# Patient Record
Sex: Female | Born: 1995 | Race: Black or African American | Hispanic: No | Marital: Single | Smoking: Current every day smoker
Health system: Southern US, Community
[De-identification: ages and names within clinical notes are randomized; demographics above are authoritative.]

## PROBLEM LIST (undated history)

## (undated) DIAGNOSIS — D649 Anemia, unspecified: Secondary | ICD-10-CM

## (undated) DIAGNOSIS — I209 Angina pectoris, unspecified: Secondary | ICD-10-CM

## (undated) DIAGNOSIS — I609 Nontraumatic subarachnoid hemorrhage, unspecified: Secondary | ICD-10-CM

## (undated) DIAGNOSIS — R569 Unspecified convulsions: Secondary | ICD-10-CM

## (undated) DIAGNOSIS — R011 Cardiac murmur, unspecified: Secondary | ICD-10-CM

## (undated) DIAGNOSIS — K649 Unspecified hemorrhoids: Secondary | ICD-10-CM

## (undated) DIAGNOSIS — R519 Headache, unspecified: Secondary | ICD-10-CM

## (undated) HISTORY — PX: NO PAST SURGERIES: SHX2092

---
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
## 2017-06-05 ENCOUNTER — Encounter: Payer: Self-pay | Admitting: Emergency Medicine

## 2017-06-05 ENCOUNTER — Emergency Department: Payer: Managed Care, Other (non HMO)

## 2017-06-05 ENCOUNTER — Emergency Department
Admission: EM | Admit: 2017-06-05 | Discharge: 2017-06-05 | Disposition: A | Discharge status: Home / Self Care | Payer: Managed Care, Other (non HMO) | Attending: Student in an Organized Health Care Education/Training Program | Admitting: Student in an Organized Health Care Education/Training Program

## 2017-06-05 DIAGNOSIS — Y998 Other external cause status: Secondary | ICD-10-CM | POA: Insufficient documentation

## 2017-06-05 DIAGNOSIS — Y92810 Car as the place of occurrence of the external cause: Secondary | ICD-10-CM | POA: Insufficient documentation

## 2017-06-05 DIAGNOSIS — S62667A Nondisplaced fracture of distal phalanx of left little finger, initial encounter for closed fracture: Secondary | ICD-10-CM

## 2017-06-05 DIAGNOSIS — Y939 Activity, unspecified: Secondary | ICD-10-CM | POA: Diagnosis not present

## 2017-06-05 DIAGNOSIS — W231XXD Caught, crushed, jammed, or pinched between stationary objects, subsequent encounter: Secondary | ICD-10-CM | POA: Diagnosis not present

## 2017-06-05 DIAGNOSIS — S60947A Unspecified superficial injury of left little finger, initial encounter: Secondary | ICD-10-CM | POA: Diagnosis present

## 2017-06-05 MED ORDER — NAPROXEN 500 MG PO TABS: 500.0000 mg | ORAL_TABLET | Freq: Two times a day (BID) | ORAL | Status: DC

## 2017-06-05 NOTE — Discharge Instructions (Signed)
Wear splint until evaluation by orthopedic clinic. Call to schedule an appointment.
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
## 2017-07-20 ENCOUNTER — Emergency Department (HOSPITAL_COMMUNITY)
Admission: EM | Admit: 2017-07-20 | Discharge: 2017-07-20 | Disposition: A | Discharge status: Home / Self Care | Payer: Managed Care, Other (non HMO) | Attending: Emergency Medicine | Admitting: Emergency Medicine

## 2017-07-20 ENCOUNTER — Emergency Department (HOSPITAL_COMMUNITY): Payer: Managed Care, Other (non HMO)

## 2017-07-20 ENCOUNTER — Encounter (HOSPITAL_COMMUNITY): Payer: Self-pay

## 2017-07-20 DIAGNOSIS — R05 Cough: Secondary | ICD-10-CM | POA: Insufficient documentation

## 2017-07-20 DIAGNOSIS — R112 Nausea with vomiting, unspecified: Secondary | ICD-10-CM | POA: Insufficient documentation

## 2017-07-20 DIAGNOSIS — E669 Obesity, unspecified: Secondary | ICD-10-CM | POA: Insufficient documentation

## 2017-07-20 DIAGNOSIS — J029 Acute pharyngitis, unspecified: Secondary | ICD-10-CM | POA: Insufficient documentation

## 2017-07-20 DIAGNOSIS — R0981 Nasal congestion: Secondary | ICD-10-CM | POA: Diagnosis not present

## 2017-07-20 DIAGNOSIS — R059 Cough, unspecified: Secondary | ICD-10-CM

## 2017-07-20 DIAGNOSIS — H9202 Otalgia, left ear: Secondary | ICD-10-CM | POA: Insufficient documentation

## 2017-07-20 DIAGNOSIS — R1110 Vomiting, unspecified: Secondary | ICD-10-CM | POA: Diagnosis present

## 2017-07-20 MED ORDER — ONDANSETRON 4 MG PO TBDP: 4.0000 mg | ORAL_TABLET | Freq: Three times a day (TID) | ORAL | 0 refills | Status: DC | PRN

## 2017-07-20 MED ORDER — BENZONATATE 100 MG PO CAPS: 100.0000 mg | ORAL_CAPSULE | Freq: Three times a day (TID) | ORAL | 0 refills | Status: DC

## 2017-07-20 NOTE — ED Triage Notes (Signed)
Pt reports post tussive emesis since yesterday. PT reports she has had cough x 4 days and has developed a sore throat since then

## 2017-07-20 NOTE — ED Provider Notes (Signed)
[facility]EMERGENCY DEPT Provider Note   CSN: [id number] Arrival date & time: 07/20/17  1652     History   Chief Complaint Chief Complaint  Patient presents with  . Emesis  . Cough    HPI Heather [name] is a 21 y.o. female who presents with cough and vomiting. She states that she had a cold about 1-2 weeks ago. Her symptoms improved from this but over the past three days she has had a worsening cough, nasal congestion, L ear pain, sore throat and post-tussive emesis. She also has nausea and vomiting after eating as well. She denies fever, chills, chest pain, SOB, abdominal pain, diarrhea, or dysuria. She denies known sick contacts.  HPI  Past Medical History:  Diagnosis Date  . Seizures (HCC)    last at age 21; stopped taking AED's in Spring 2014.    Patient Active Problem List   Diagnosis Date Noted  . Acne 12/06/2015  . Family history of breast cancer in female 11/30/2015  . Chlamydia infection 11/30/2015    History reviewed. No pertinent surgical history.  OB History    Gravida Para Term Preterm AB Living   0 0 0 0 0 0   SAB TAB Ectopic Multiple Live Births   0 0 0 0         Home Medications    Prior to Admission medications   Medication Sig Start Date End Date Taking? Authorizing Provider  azithromycin (ZITHROMAX) 250 MG tablet Four pills by mouth times one 12/03/15   Lada, Janit BernMelinda P, MD  benzonatate (TESSALON) 100 MG capsule Take 1 capsule (100 mg total) by mouth every 8 (eight) hours. 07/20/17   Bethel BornGekas, Tanea Moga Marie, PA-C  clindamycin (CLEOCIN-T) 1 % lotion Apply topically 2 (two) times daily. 11/30/15   Kerman PasseyLada, Melinda P, MD  ibuprofen (ADVIL,MOTRIN) 200 MG tablet Take 400 mg by mouth every 6 (six) hours as needed for mild pain.    [provider]  naproxen (NAPROSYN) 500 MG tablet Take 1 tablet (500 mg total) by mouth 2 (two) times daily with a meal. 06/05/17   Joni ReiningSmith, Ronald K, PA-C  ondansetron (ZOFRAN ODT) 4 MG disintegrating tablet Take 1 tablet (4 mg  total) by mouth every 8 (eight) hours as needed for nausea or vomiting. 07/20/17   Bethel BornGekas, Treyden Hakim Marie, PA-C    Family History Family History  Problem Relation Age of Onset  . Hypertension Father   . Cancer Maternal Uncle        breast  . Cancer Maternal Grandmother        breast  . Hypertension Paternal Grandmother   . COPD Neg Hx   . Diabetes Neg Hx   . Heart disease Neg Hx   . Stroke Neg Hx     Social History Social History  Substance Use Topics  . Smoking status: Never Smoker  . Smokeless tobacco: Never Used  . Alcohol use No     Allergies   Patient has no known allergies.   Review of Systems Review of Systems  Constitutional: Negative for chills and fever.  HENT: Positive for congestion, ear pain and sore throat. Negative for sinus pain.   Respiratory: Positive for cough. Negative for shortness of breath and wheezing.   Cardiovascular: Negative for chest pain.  Gastrointestinal: Positive for nausea and vomiting. Negative for abdominal pain and diarrhea.     Physical Exam Updated Vital Signs BP 132/74 (BP Location: Left Arm)   Pulse 68   Temp 98.8 F (37.1  C) (Oral)   Resp 16   Ht  (1.676 m)   Wt 89.8 kg (198 lb)   SpO2 99%   BMI 31.96 kg/m   Physical Exam  Constitutional: She is oriented to person, place, and time. She appears well-developed and well-nourished. No distress.  HENT:  Head: Normocephalic and atraumatic.  Right Ear: Hearing, tympanic membrane, external ear and ear canal normal.  Left Ear: Hearing, external ear and ear canal normal.  Nose: Mucosal edema present.  Mouth/Throat: Uvula is midline, oropharynx is clear and moist and mucous membranes are normal.  L cerumen impaction  Eyes: Pupils are equal, round, and reactive to light. Conjunctivae are normal. Right eye exhibits no discharge. Left eye exhibits no discharge. No scleral icterus.  Neck: Normal range of motion.  Cardiovascular: Normal rate and regular rhythm.  Exam reveals  no gallop and no friction rub.   No murmur heard. Pulmonary/Chest: Effort normal and breath sounds normal. No respiratory distress. She has no wheezes. She has no rales. She exhibits no tenderness.  Abdominal: Soft. Bowel sounds are normal. She exhibits no distension. There is no tenderness.  Neurological: She is alert and oriented to person, place, and time.  Skin: Skin is warm and dry.  Psychiatric: She has a normal mood and affect. Her behavior is normal.  Nursing note and vitals reviewed.    ED Treatments / Results  Labs (all labs ordered are listed, but only abnormal results are displayed) Labs Reviewed - No data to display  EKG  EKG Interpretation None       Radiology Dg Chest 2 View  Result Date: 07/20/2017 CLINICAL DATA:  Pt c/o cough and N/V x 1 day. No hx of heart or lung problems. Pt is a nonsmoker EXAM: CHEST  2 VIEW COMPARISON:  11/08/2005 FINDINGS: Normal heart, mediastinum and hila. The lungs are clear and are symmetrically aerated. No pleural effusion or pneumothorax. Skeletal structures are unremarkable. IMPRESSION: Normal chest radiographs. Electronically Signed   By: Amie Portland M.D.   On: 07/20/2017 17:23    Procedures Procedures (including critical care time)  Medications Ordered in ED Medications - No data to display   Initial Impression / Assessment and Plan / ED Course  I have reviewed the triage vital signs and the nursing notes.  Pertinent labs & imaging results that were available during my care of the patient were reviewed by me and considered in my medical decision making (see chart for details).  21 year old female with cough and vomiting. Vitals are normal. She is well-appearing. Exam is overall unremarkable, likely viral illness. CXR is negative. Will treat with Zofran and Tessalon. Return precautions given.  Final Clinical Impressions(s) / ED Diagnoses   Final diagnoses:  Cough  Non-intractable vomiting with nausea, unspecified  vomiting type    New Prescriptions New Prescriptions   BENZONATATE (TESSALON) 100 MG CAPSULE    Take 1 capsule (100 mg total) by mouth every 8 (eight) hours.   ONDANSETRON (ZOFRAN ODT) 4 MG DISINTEGRATING TABLET    Take 1 tablet (4 mg total) by mouth every 8 (eight) hours as needed for nausea or vomiting.     Bethel Born, PA-C 07/20/17 Thedora Hinders, MD 07/21/17 [phone]

## 2017-07-20 NOTE — Discharge Instructions (Signed)
Take Tessalon for cough as needed Take Zofran for nausea and vomiting Please follow up with your doctor

## 2017-07-20 NOTE — ED Notes (Signed)
Declined W/C at D/C and was escorted to lobby by RN. 

## 2017-07-30 ENCOUNTER — Emergency Department (HOSPITAL_COMMUNITY)
Admission: EM | Admit: 2017-07-30 | Discharge: 2017-07-30 | Disposition: A | Discharge status: Home / Self Care | Payer: Managed Care, Other (non HMO) | Attending: Emergency Medicine | Admitting: Emergency Medicine

## 2017-07-30 ENCOUNTER — Encounter (HOSPITAL_COMMUNITY): Payer: Self-pay | Admitting: Emergency Medicine

## 2017-07-30 DIAGNOSIS — H60501 Unspecified acute noninfective otitis externa, right ear: Secondary | ICD-10-CM | POA: Diagnosis not present

## 2017-07-30 DIAGNOSIS — Z79899 Other long term (current) drug therapy: Secondary | ICD-10-CM | POA: Diagnosis not present

## 2017-07-30 DIAGNOSIS — H9201 Otalgia, right ear: Secondary | ICD-10-CM | POA: Diagnosis present

## 2017-07-30 MED ORDER — CIPROFLOXACIN-DEXAMETHASONE 0.3-0.1 % OT SUSP: 4.0000 [drp] | Freq: Two times a day (BID) | OTIC | 0 refills | Status: DC

## 2017-07-30 NOTE — ED Notes (Addendum)
Attempted to asses pt ear, she states "I don't want you to look in my ear because it hurts". She states it hurts on the inside and outside. Explained to pt that the EDP will need to look in her ear to determine if there is any infection. When asked if she has had any fever pt replies "I don't know"

## 2017-07-30 NOTE — ED Triage Notes (Signed)
C/o R ear pain x 2 days. 

## 2017-07-30 NOTE — ED Provider Notes (Signed)
[facility] Provider Note   CSN: [id number] Arrival date & time: 07/30/17  1557     History   Chief Complaint Chief Complaint  Patient presents with  . Otalgia    HPI Heather [name] is a 21 y.o. female.  The history is provided by the patient and medical records.  Otalgia   21 year old female with history of seizures, presenting to the ED with right ear pain. States it has been hurting her for about 2-3 days now. She denies any fever or chills. States her hearing sounds muffled and her ear seems "swollen". She's not tried any medications for her symptoms.  Past Medical History:  Diagnosis Date  . Seizures (HCC)    last at age 14; stopped taking AED's in Spring 2014.    Patient Active Problem List   Diagnosis Date Noted  . Acne 12/06/2015  . Family history of breast cancer in female 11/30/2015  . Chlamydia infection 11/30/2015    History reviewed. No pertinent surgical history.  OB History    Gravida Para Term Preterm AB Living   0 0 0 0 0 0   SAB TAB Ectopic Multiple Live Births   0 0 0 0         Home Medications    Prior to Admission medications   Medication Sig Start Date End Date Taking? Authorizing Provider  azithromycin (ZITHROMAX) 250 MG tablet Four pills by mouth times one 12/03/15   Lada, Janit Bern, MD  benzonatate (TESSALON) 100 MG capsule Take 1 capsule (100 mg total) by mouth every 8 (eight) hours. 07/20/17   Bethel Born, PA-C  clindamycin (CLEOCIN-T) 1 % lotion Apply topically 2 (two) times daily. 11/30/15   Kerman Passey, MD  ibuprofen (ADVIL,MOTRIN) 200 MG tablet Take 400 mg by mouth every 6 (six) hours as needed for mild pain.    [provider]  naproxen (NAPROSYN) 500 MG tablet Take 1 tablet (500 mg total) by mouth 2 (two) times daily with a meal. 06/05/17   Joni Reining, PA-C  ondansetron (ZOFRAN ODT) 4 MG disintegrating tablet Take 1 tablet (4 mg total) by mouth every 8 (eight) hours as needed for nausea or  vomiting. 07/20/17   Bethel Born, PA-C    Family History Family History  Problem Relation Age of Onset  . Hypertension Father   . Cancer Maternal Uncle        breast  . Cancer Maternal Grandmother        breast  . Hypertension Paternal Grandmother   . COPD Neg Hx   . Diabetes Neg Hx   . Heart disease Neg Hx   . Stroke Neg Hx     Social History Social History  Substance Use Topics  . Smoking status: Never Smoker  . Smokeless tobacco: Never Used  . Alcohol use No     Allergies   Patient has no known allergies.   Review of Systems Review of Systems  HENT: Positive for ear pain.   All other systems reviewed and are negative.    Physical Exam Updated Vital Signs BP 138/88 (BP Location: Right Arm)   Pulse 84   Temp 98.4 F (36.9 C) (Oral)   Resp 16   SpO2 100%   Physical Exam  Constitutional: She is oriented to person, place, and time. She appears well-developed and well-nourished.  HENT:  Head: Normocephalic and atraumatic.  Mouth/Throat: Oropharynx is clear and moist.  Right EAC swollen and tender to palpation, TMs  are intact and without signs of infection bilaterally; no mastoid tenderness  Eyes: Pupils are equal, round, and reactive to light. Conjunctivae and EOM are normal.  Neck: Normal range of motion.  Cardiovascular: Normal rate, regular rhythm and normal heart sounds.   Pulmonary/Chest: Effort normal and breath sounds normal.  Abdominal: Soft. Bowel sounds are normal.  Musculoskeletal: Normal range of motion.  Neurological: She is alert and oriented to person, place, and time.  Skin: Skin is warm and dry.  Psychiatric: She has a normal mood and affect.  Nursing note and vitals reviewed.    ED Treatments / Results  Labs (all labs ordered are listed, but only abnormal results are displayed) Labs Reviewed - No data to display  EKG  EKG Interpretation None       Radiology No results found.  Procedures Procedures (including  critical care time)  Medications Ordered in ED Medications - No data to display   Initial Impression / Assessment and Plan / ED Course  I have reviewed the triage vital signs and the nursing notes.  Pertinent labs & imaging results that were available during my care of the patient were reviewed by me and considered in my medical decision making (see chart for details).  21 year old female here with right ear pain. Appears to have otitis externa. TMs are normal in appearance bilaterally. No mastoid tenderness. Will start on Ciprodex drops. Will have her follow-up with her primary care doctor.  Discussed plan with patient, she acknowledged understanding and agreed with plan of care.  Return precautions given for new or worsening symptoms.  Final Clinical Impressions(s) / ED Diagnoses   Final diagnoses:  Acute otitis externa of right ear, unspecified type    New Prescriptions New Prescriptions   CIPROFLOXACIN-DEXAMETHASONE (CIPRODEX) OTIC SUSPENSION    Place 4 drops into the right ear 2 (two) times daily.     Garlon Hatchet, PA-C 07/30/17 1719    Melene Plan, DO 07/30/17 1730

## 2017-07-30 NOTE — Discharge Instructions (Signed)
Take the prescribed medication as directed.  Can use tylenol/motrin for pain.  Can also use warm compresses. Follow-up with your primary care doctor. Return to the ED for new or worsening symptoms.

## 2017-09-21 IMAGING — CR DG CHEST 2V
2 series · 2 of 2 positions shown · non-contrast
Comparison: 11/08/2005

CLINICAL DATA: Pt c/o cough and N/V x 1 day. No hx of heart or lung
problems. Pt is a nonsmoker

EXAM:
CHEST  2 VIEW

[chest pa]
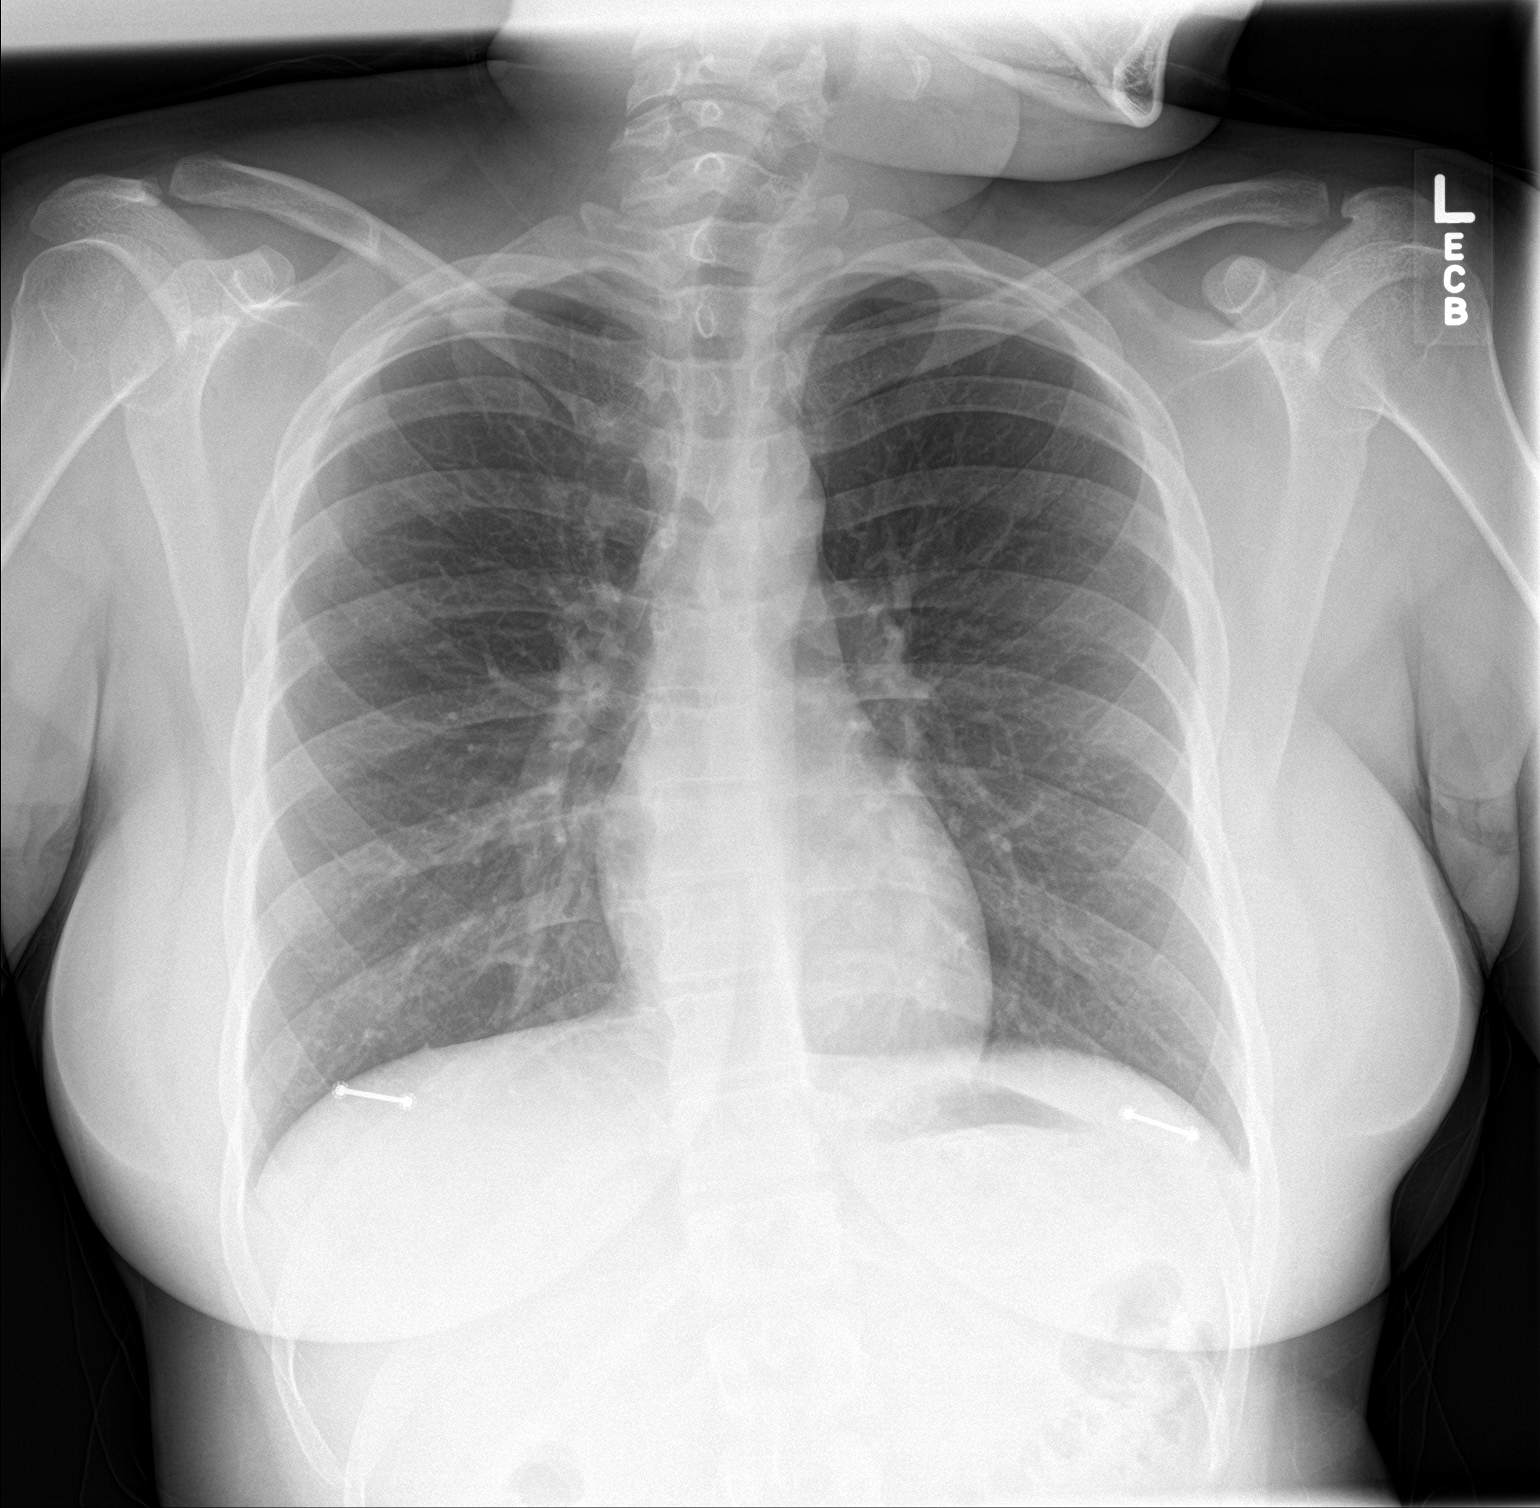

[chest lat]
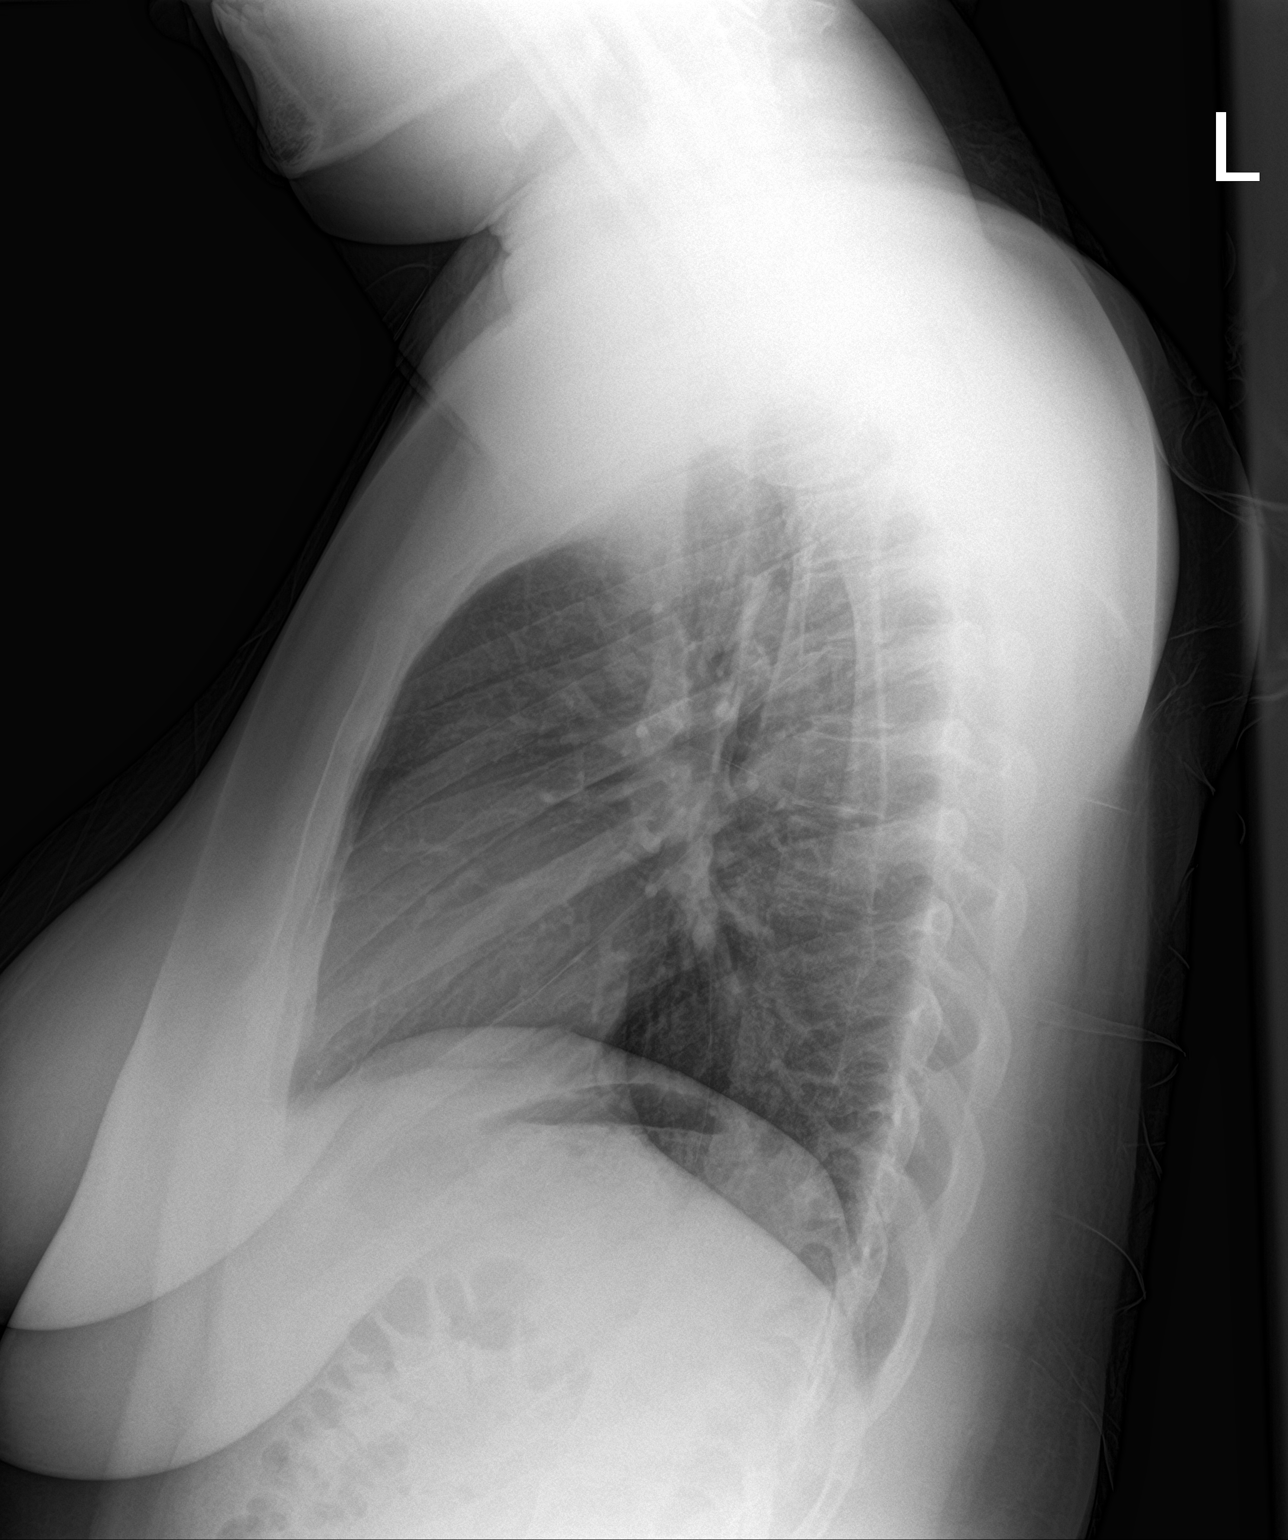

[2 of 2 positions shown; findings below may reference images not displayed]

FINDINGS: Normal heart, mediastinum and hila.

The lungs are clear and are symmetrically aerated.

No pleural effusion or pneumothorax.

Skeletal structures are unremarkable.
IMPRESSION: Normal chest radiographs.

## 2017-11-18 ENCOUNTER — Encounter (HOSPITAL_COMMUNITY): Payer: Self-pay

## 2017-11-18 ENCOUNTER — Emergency Department (HOSPITAL_COMMUNITY)
Admission: EM | Admit: 2017-11-18 | Discharge: 2017-11-18 | Disposition: A | Discharge status: Home / Self Care | Payer: Managed Care, Other (non HMO) | Attending: Emergency Medicine | Admitting: Emergency Medicine

## 2017-11-18 DIAGNOSIS — Z5321 Procedure and treatment not carried out due to patient leaving prior to being seen by health care provider: Secondary | ICD-10-CM | POA: Insufficient documentation

## 2017-11-18 DIAGNOSIS — R1030 Lower abdominal pain, unspecified: Secondary | ICD-10-CM | POA: Diagnosis not present

## 2017-11-18 LAB — COMPREHENSIVE METABOLIC PANEL
ALBUMIN: 4.5 g/dL (ref 3.5–5.0)
ALK PHOS: 86 U/L (ref 38–126)
ALT: 15 U/L (ref 14–54)
AST: 18 U/L (ref 15–41)
Anion gap: 5 (ref 5–15)
BILIRUBIN TOTAL: 1.1 mg/dL (ref 0.3–1.2)
BUN: 8 mg/dL (ref 6–20)
CALCIUM: 9.3 mg/dL (ref 8.9–10.3)
CO2: 26 mmol/L (ref 22–32)
CREATININE: 0.92 mg/dL (ref 0.44–1.00)
Chloride: 107 mmol/L (ref 101–111)
GFR calc Af Amer: >60 mL/min (ref >60)
GFR calc non Af Amer: >60 mL/min (ref >60)
GLUCOSE: 87 mg/dL (ref 65–99)
Potassium: 3.5 mmol/L (ref 3.5–5.1)
Sodium: 138 mmol/L (ref 135–145)
Total Protein: 7.4 g/dL (ref 6.5–8.1)

## 2017-11-18 LAB — CBC
HCT: 41.1 % (ref 36.0–46.0)
Hemoglobin: 13.6 g/dL (ref 12.0–15.0)
MCH: 30 pg (ref 26.0–34.0)
MCHC: 33.1 g/dL (ref 30.0–36.0)
MCV: 90.7 fL (ref 78.0–100.0)
Platelets: 225 10*3/uL (ref 150–400)
RBC: 4.53 MIL/uL (ref 3.87–5.11)
RDW: 12.9 % (ref 11.5–15.5)
WBC: 16.2 10*3/uL — ABNORMAL HIGH (ref 4.0–10.5)

## 2017-11-18 LAB — URINALYSIS, ROUTINE W REFLEX MICROSCOPIC
BILIRUBIN URINE: NEGATIVE
Glucose, UA: NEGATIVE mg/dL
Hgb urine dipstick: NEGATIVE
KETONES UR: 5 mg/dL — AB
Leukocytes, UA: NEGATIVE
Nitrite: NEGATIVE
PH: 5 (ref 5.0–8.0)
Protein, ur: NEGATIVE mg/dL
Specific Gravity, Urine: 1.027 (ref 1.005–1.030)

## 2017-11-18 LAB — I-STAT BETA HCG BLOOD, ED (MC, WL, AP ONLY): I-stat hCG, quantitative: <5 m[IU]/mL (ref <5)

## 2017-11-18 LAB — LIPASE, BLOOD: Lipase: 17 U/L (ref 11–51)

## 2017-11-18 NOTE — ED Triage Notes (Signed)
Pt complains of lower abd pain since 6am, denies vomiting and diarrhea

## 2017-11-18 NOTE — ED Notes (Signed)
Pt c/o lower abd pain onset this morning. She took some pepto-bismol with no effect. Normal bowel movement today. Ambulatory.

## 2017-11-18 NOTE — ED Notes (Signed)
Pt called from the lobby to roomed, no answer at this time.

## 2017-11-18 NOTE — ED Notes (Signed)
Pt not seen in the lobby  

## 2018-01-21 ENCOUNTER — Encounter (HOSPITAL_COMMUNITY): Payer: Self-pay | Admitting: Emergency Medicine

## 2018-01-21 ENCOUNTER — Emergency Department (HOSPITAL_COMMUNITY)
Admission: EM | Admit: 2018-01-21 | Discharge: 2018-01-21 | Disposition: A | Discharge status: Home / Self Care | Payer: Managed Care, Other (non HMO) | Attending: Emergency Medicine | Admitting: Emergency Medicine

## 2018-01-21 DIAGNOSIS — M25562 Pain in left knee: Secondary | ICD-10-CM | POA: Insufficient documentation

## 2018-01-21 DIAGNOSIS — Z5321 Procedure and treatment not carried out due to patient leaving prior to being seen by health care provider: Secondary | ICD-10-CM | POA: Insufficient documentation
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
## 2018-01-21 NOTE — ED Notes (Signed)
Left AMA at this time.  Encouraged to stay without success.

## 2018-02-28 DIAGNOSIS — R569 Unspecified convulsions: Secondary | ICD-10-CM | POA: Insufficient documentation

## 2018-07-09 ENCOUNTER — Emergency Department
Admission: EM | Admit: 2018-07-09 | Discharge: 2018-07-09 | Disposition: A | Discharge status: Home / Self Care | Payer: Medicaid Other | Attending: Emergency Medicine | Admitting: Emergency Medicine

## 2018-07-09 ENCOUNTER — Other Ambulatory Visit: Payer: Self-pay

## 2018-07-09 ENCOUNTER — Emergency Department
Admission: EM | Admit: 2018-07-09 | Discharge: 2018-07-09 | Disposition: A | Discharge status: Home / Self Care | Payer: Medicaid Other | Source: Home / Self Care | Attending: Emergency Medicine | Admitting: Emergency Medicine

## 2018-07-09 ENCOUNTER — Encounter: Payer: Self-pay | Admitting: Emergency Medicine

## 2018-07-09 DIAGNOSIS — R109 Unspecified abdominal pain: Secondary | ICD-10-CM

## 2018-07-09 DIAGNOSIS — N760 Acute vaginitis: Principal | ICD-10-CM

## 2018-07-09 DIAGNOSIS — B9689 Other specified bacterial agents as the cause of diseases classified elsewhere: Secondary | ICD-10-CM

## 2018-07-09 DIAGNOSIS — Z5320 Procedure and treatment not carried out because of patient's decision for unspecified reasons: Secondary | ICD-10-CM | POA: Diagnosis not present

## 2018-07-09 LAB — POCT PREGNANCY, URINE: Preg Test, Ur: NEGATIVE

## 2018-07-09 LAB — CBC
HCT: 39.7 % (ref 35.0–47.0)
HEMOGLOBIN: 13.5 g/dL (ref 12.0–16.0)
MCH: 31.3 pg (ref 26.0–34.0)
MCHC: 33.9 g/dL (ref 32.0–36.0)
MCV: 92.2 fL (ref 80.0–100.0)
PLATELETS: 217 10*3/uL (ref 150–440)
RBC: 4.31 MIL/uL (ref 3.80–5.20)
RDW: 14.3 % (ref 11.5–14.5)
WBC: 12.7 10*3/uL — AB (ref 3.6–11.0)

## 2018-07-09 LAB — COMPREHENSIVE METABOLIC PANEL
ALK PHOS: 80 U/L (ref 38–126)
ALT: 13 U/L (ref 0–44)
AST: 14 U/L — ABNORMAL LOW (ref 15–41)
Albumin: 4.2 g/dL (ref 3.5–5.0)
Anion gap: 6 (ref 5–15)
BUN: 10 mg/dL (ref 6–20)
CALCIUM: 8.9 mg/dL (ref 8.9–10.3)
CO2: 27 mmol/L (ref 22–32)
Chloride: 105 mmol/L (ref 98–111)
Creatinine, Ser: 0.69 mg/dL (ref 0.44–1.00)
GFR calc Af Amer: >60 mL/min (ref >60)
GFR calc non Af Amer: >60 mL/min (ref >60)
Glucose, Bld: 85 mg/dL (ref 70–99)
Potassium: 3.5 mmol/L (ref 3.5–5.1)
Sodium: 138 mmol/L (ref 135–145)
Total Bilirubin: 2.4 mg/dL — ABNORMAL HIGH (ref 0.3–1.2)
Total Protein: 6.9 g/dL (ref 6.5–8.1)

## 2018-07-09 LAB — URINALYSIS, COMPLETE (UACMP) WITH MICROSCOPIC
Bacteria, UA: NONE SEEN
Bilirubin Urine: NEGATIVE
GLUCOSE, UA: NEGATIVE mg/dL
Hgb urine dipstick: NEGATIVE
KETONES UR: 20 mg/dL — AB
LEUKOCYTES UA: NEGATIVE
Nitrite: NEGATIVE
PH: 6 (ref 5.0–8.0)
Protein, ur: NEGATIVE mg/dL
Specific Gravity, Urine: 1.025 (ref 1.005–1.030)

## 2018-07-09 LAB — WET PREP, GENITAL
SPERM: NONE SEEN
Trich, Wet Prep: NONE SEEN
Yeast Wet Prep HPF POC: NONE SEEN

## 2018-07-09 LAB — LIPASE, BLOOD: Lipase: 19 U/L (ref 11–51)

## 2018-07-09 LAB — CHLAMYDIA/NGC RT PCR (ARMC ONLY)
CHLAMYDIA TR: NOT DETECTED
N GONORRHOEAE: NOT DETECTED

## 2018-07-09 MED ORDER — DOXYCYCLINE HYCLATE 100 MG PO CAPS: 100.0000 mg | ORAL_CAPSULE | Freq: Two times a day (BID) | ORAL | 0 refills | Status: AC

## 2018-07-09 MED ORDER — METRONIDAZOLE 500 MG PO TABS: 500.0000 mg | ORAL_TABLET | Freq: Two times a day (BID) | ORAL | 0 refills | Status: AC

## 2018-07-09 MED ORDER — CEFTRIAXONE SODIUM 250 MG IJ SOLR
250.0000 mg | Freq: Once | INTRAMUSCULAR | Status: AC
Start: 2018-07-09 — End: 2018-07-09
  Administered 2018-07-09: 250 mg via INTRAMUSCULAR
  Filled 2018-07-09: qty 250

## 2018-07-09 NOTE — ED Triage Notes (Signed)
Pt presents to ED via POV with c/o of lower abdominal pain that began yesterday. Pt describes pain as sharp pain. Pt denies nausea, vomiting, or diarrhea.last period was two weeks ago. Pt states she drank 2 nights ago and she is scared she has alcohol poisoning.

## 2018-07-09 NOTE — ED Provider Notes (Signed)
[facility] Emergency Department Provider Note  ____________________________________________   I have reviewed the triage vital signs and the nursing notes.   HISTORY  Chief Complaint Abdominal Pain   History limited by: Not Limited   HPI Heather [name] is a 22 y.o. female who returns to the emergency department today.  I have evaluated the patient earlier this shift.  She had left before the evaluation could complete and left without alerting anyone.  When I asked her why she left she stated she was tired of waiting.  She is now accompanied by her mother.  Continues to have the pain.   Per medical record review patient has a history of seizures, chlamydia  Past Medical History:  Diagnosis Date  . Seizures (HCC)    last at age 54; stopped taking AED's in Spring 2014.    Patient Active Problem List   Diagnosis Date Noted  . Acne 12/06/2015  . Family history of breast cancer in female 11/30/2015  . Chlamydia infection 11/30/2015    No past surgical history on file.  Prior to Admission medications   Medication Sig Start Date End Date Taking? Authorizing Provider  azithromycin (ZITHROMAX) 250 MG tablet Four pills by mouth times one 12/03/15   Lada, Janit Bern, MD  benzonatate (TESSALON) 100 MG capsule Take 1 capsule (100 mg total) by mouth every 8 (eight) hours. 07/20/17   Bethel Born, PA-C  ciprofloxacin-dexamethasone (CIPRODEX) OTIC suspension Place 4 drops into the right ear 2 (two) times daily. 07/30/17   Garlon Hatchet, PA-C  clindamycin (CLEOCIN-T) 1 % lotion Apply topically 2 (two) times daily. 11/30/15   Kerman Passey, MD  ibuprofen (ADVIL,MOTRIN) 200 MG tablet Take 400 mg by mouth every 6 (six) hours as needed for mild pain.    [provider]  naproxen (NAPROSYN) 500 MG tablet Take 1 tablet (500 mg total) by mouth 2 (two) times daily with a meal. 06/05/17   Joni Reining, PA-C  ondansetron (ZOFRAN ODT) 4 MG disintegrating  tablet Take 1 tablet (4 mg total) by mouth every 8 (eight) hours as needed for nausea or vomiting. 07/20/17   Bethel Born, PA-C    Allergies Patient has no known allergies.  Family History  Problem Relation Age of Onset  . Hypertension Father   . Cancer Maternal Uncle        breast  . Cancer Maternal Grandmother        breast  . Hypertension Paternal Grandmother   . COPD Neg Hx   . Diabetes Neg Hx   . Heart disease Neg Hx   . Stroke Neg Hx     Social History Social History   Tobacco Use  . Smoking status: Never Smoker  . Smokeless tobacco: Never Used  Substance Use Topics  . Alcohol use: No  . Drug use: No    Review of Systems Constitutional: No fever/chills Eyes: No visual changes. ENT: No sore throat. Cardiovascular: Denies chest pain. Respiratory: Denies shortness of breath. Gastrointestinal: Positive for lower abdominal pain. Genitourinary: Negative for dysuria. Musculoskeletal: Negative for back pain. Skin: Negative for rash. Neurological: Negative for headaches, focal weakness or numbness.  ____________________________________________   PHYSICAL EXAM:  VITAL SIGNS: ED Triage Vitals [07/09/18 1843]  Enc Vitals Group     BP 135/78     Pulse Rate 68     Resp 18     Temp 98.4 F (36.9 C)     Temp Source Oral  SpO2 98 %     Weight 140 lb (63.5 kg)     Height 5\' 5"  (1.651 m)     Head Circumference      Peak Flow      Pain Score 9     Pain Loc      Pain Edu?      Excl. in GC?      Constitutional: Alert and oriented.  Eyes: Conjunctivae are normal.  ENT      Head: Normocephalic and atraumatic.      Nose: No congestion/rhinnorhea.      Mouth/Throat: Mucous membranes are moist.      Neck: No stridor. Hematological/Lymphatic/Immunilogical: No cervical lymphadenopathy. Cardiovascular: Normal rate, regular rhythm.  No murmurs, rubs, or gallops.  Respiratory: Normal respiratory effort without tachypnea nor retractions. Breath sounds are  clear and equal bilaterally. No wheezes/rales/rhonchi. Gastrointestinal: Soft and tender in the suprapubic region Genitourinary: Deferred Musculoskeletal: Normal range of motion in all extremities. No lower extremity edema. Neurologic:  Normal speech and language. No gross focal neurologic deficits are appreciated.  Skin:  Skin is warm, dry and intact. No rash noted. Psychiatric: Mood and affect are normal. Speech and behavior are normal. Patient exhibits appropriate insight and judgment.  ____________________________________________    LABS (pertinent positives/negatives)  Wet prep present clue cells  ____________________________________________   EKG  None  ____________________________________________    RADIOLOGY  None   ____________________________________________   PROCEDURES  Procedures  ____________________________________________   INITIAL IMPRESSION / ASSESSMENT AND PLAN / ED COURSE  Pertinent labs & imaging results that were available during my care of the patient were reviewed by me and considered in my medical decision making (see chart for details).   Patient presented to the emergency department today because of concerns for lower abdominal pain.  Wet prep did show clue cells concerning for bacterial vaginosis.  However given significance of the patient's pain do have concerns for possible PID.  Will give patient antibiotics to treat for that.  Discussed plan with patient.   ____________________________________________   FINAL CLINICAL IMPRESSION(S) / ED DIAGNOSES  Final diagnoses:  Bacterial vaginitis     Note: This dictation was prepared with Dragon dictation. Any transcriptional errors that result from this process are unintentional     Phineas Semen, MD 07/09/18 2131

## 2018-07-09 NOTE — ED Notes (Signed)
Pt came out of room and left. RN Britt Boozer called pt's name but she kept walking. RN Britt Boozer called out to First Nurse to make sure pt didn't have an IV and pt had stated that she had been here for over 4 hours and we weren't gong to do anything for her.

## 2018-07-09 NOTE — ED Notes (Signed)
Pt up an left room. Called her name and she didn't respond. Call to first nurse who stopped her and spoke to her. She states she has waited 4 hours and isn't waiting any longer. First nurse asked her to stay and she refused. Pt left AMA. EDP aware

## 2018-07-09 NOTE — ED Provider Notes (Signed)
[facility] Emergency Department Provider Note  ____________________________________________   I have reviewed the triage vital signs and the nursing notes.   HISTORY  Chief Complaint Abdominal Pain   History limited by: Not Limited   HPI Heather [name] is a 22 y.o. female who presents to the emergency department today because of concerns for abdominal pain.  Located in the suprapubic region.  Started yesterday.  Has been constant.  It is severe.  Patient denies any associated nausea or vomiting.  She denies any change in urination or abnormal vaginal discharge.  She denies similar symptoms in the past.  Denies any fevers.    Per medical record review patient has a history of chlamydia infection, seizures.   Past Medical History:  Diagnosis Date  . Seizures (HCC)    last at age 79; stopped taking AED's in Spring 2014.    Patient Active Problem List   Diagnosis Date Noted  . Acne 12/06/2015  . Family history of breast cancer in female 11/30/2015  . Chlamydia infection 11/30/2015    History reviewed. No pertinent surgical history.  Prior to Admission medications   Medication Sig Start Date End Date Taking? Authorizing Provider  azithromycin (ZITHROMAX) 250 MG tablet Four pills by mouth times one 12/03/15   Lada, Janit Bern, MD  benzonatate (TESSALON) 100 MG capsule Take 1 capsule (100 mg total) by mouth every 8 (eight) hours. 07/20/17   Bethel Born, PA-C  ciprofloxacin-dexamethasone (CIPRODEX) OTIC suspension Place 4 drops into the right ear 2 (two) times daily. 07/30/17   Garlon Hatchet, PA-C  clindamycin (CLEOCIN-T) 1 % lotion Apply topically 2 (two) times daily. 11/30/15   Kerman Passey, MD  ibuprofen (ADVIL,MOTRIN) 200 MG tablet Take 400 mg by mouth every 6 (six) hours as needed for mild pain.    [provider]  naproxen (NAPROSYN) 500 MG tablet Take 1 tablet (500 mg total) by mouth 2 (two) times daily with a meal. 06/05/17    Joni Reining, PA-C  ondansetron (ZOFRAN ODT) 4 MG disintegrating tablet Take 1 tablet (4 mg total) by mouth every 8 (eight) hours as needed for nausea or vomiting. 07/20/17   Bethel Born, PA-C    Allergies Patient has no known allergies.  Family History  Problem Relation Age of Onset  . Hypertension Father   . Cancer Maternal Uncle        breast  . Cancer Maternal Grandmother        breast  . Hypertension Paternal Grandmother   . COPD Neg Hx   . Diabetes Neg Hx   . Heart disease Neg Hx   . Stroke Neg Hx     Social History Social History   Tobacco Use  . Smoking status: Never Smoker  . Smokeless tobacco: Never Used  Substance Use Topics  . Alcohol use: No  . Drug use: No    Review of Systems Constitutional: No fever/chills Eyes: No visual changes. ENT: No sore throat. Cardiovascular: Denies chest pain. Respiratory: Denies shortness of breath. Gastrointestinal: Positive for lower abdominal pain.   Genitourinary: Negative for dysuria. Musculoskeletal: Negative for back pain. Skin: Negative for rash. Neurological: Negative for headaches, focal weakness or numbness.  ____________________________________________   PHYSICAL EXAM:  VITAL SIGNS: ED Triage Vitals [07/09/18 1555]  Enc Vitals Group     BP 128/84     Pulse Rate (!) 54     Resp 18     Temp (!) 97.4 F (36.3 C)  Temp Source Oral     SpO2 100 %     Weight 140 lb (63.5 kg)     Height 5\' 5"  (1.651 m)     Head Circumference      Peak Flow      Pain Score 8   Constitutional: Alert and oriented.  Eyes: Conjunctivae are normal.  ENT      Head: Normocephalic and atraumatic.      Nose: No congestion/rhinnorhea.      Mouth/Throat: Mucous membranes are moist.      Neck: No stridor. Hematological/Lymphatic/Immunilogical: No cervical lymphadenopathy. Cardiovascular: Normal rate, regular rhythm.  No murmurs, rubs, or gallops.  Respiratory: Normal respiratory effort without tachypnea nor  retractions. Breath sounds are clear and equal bilaterally. No wheezes/rales/rhonchi. Gastrointestinal: Soft and tender to palpation in the suprapubic region. No rebound. No guarding.  Genitourinary: Deferred Musculoskeletal: Normal range of motion in all extremities. No lower extremity edema. Neurologic:  Normal speech and language. No gross focal neurologic deficits are appreciated.  Skin:  Skin is warm, dry and intact. No rash noted. Psychiatric: Mood and affect are normal. Speech and behavior are normal. Patient exhibits appropriate insight and judgment.  ____________________________________________    LABS (pertinent positives/negatives)  Upreg negative CBC wbc 12.7, hgb 13.5, plt 217 UA hazy, ketones 20, 0-5 rbc and wbc  ____________________________________________   EKG  None  ____________________________________________    RADIOLOGY  None   ____________________________________________   PROCEDURES  Procedures  ____________________________________________   INITIAL IMPRESSION / ASSESSMENT AND PLAN / ED COURSE  Pertinent labs & imaging results that were available during my care of the patient were reviewed by me and considered in my medical decision making (see chart for details).   Patient presented to the emergency department today with lower abdominal pain that started yesterday.  During my exam I discussed with patient plan to await urine results and if unremarkable further tests including pelvic exam and swabs.  However shortly after I left the room apparently the patient was observed walking out of the emergency department.   ____________________________________________   FINAL CLINICAL IMPRESSION(S) / ED DIAGNOSES  Abdominal pain  Note: This dictation was prepared with Dragon dictation. Any transcriptional errors that result from this process are unintentional     Phineas Semen, MD 07/09/18 [phone]

## 2018-07-09 NOTE — ED Triage Notes (Signed)
Pt c/o lower abdominal pain. See previous triage note. Pt eloped at approx 1750 per last note.

## 2018-07-09 NOTE — Discharge Instructions (Signed)
Please seek medical attention for any high fevers, chest pain, shortness of breath, change in behavior, persistent vomiting, bloody stool or any other new or concerning symptoms.  

## 2018-07-09 NOTE — ED Notes (Signed)
Called report to [employer] at

## 2018-07-09 NOTE — ED Notes (Signed)
Pt sleeping soundly. Lights turned out.

## 2018-07-09 NOTE — ED Notes (Signed)
Attempted to call legal guardian Mother and got no answer. Called grandmother and she stated mother was legal guardian. She also stated that she would try to contact her and get patient to come back and be seen.

## 2018-09-07 LAB — HM HIV SCREENING LAB: HM HIV Screening: NEGATIVE

## 2018-09-21 ENCOUNTER — Ambulatory Visit (INDEPENDENT_AMBULATORY_CARE_PROVIDER_SITE_OTHER): Payer: Self-pay | Admitting: Maternal Newborn

## 2018-09-21 ENCOUNTER — Other Ambulatory Visit (HOSPITAL_COMMUNITY)
Admission: RE | Admit: 2018-09-21 | Discharge: 2018-09-21 | Disposition: A | Discharge status: Home / Self Care | Payer: Medicaid Other | Source: Ambulatory Visit | Attending: Maternal Newborn | Admitting: Maternal Newborn

## 2018-09-21 ENCOUNTER — Encounter: Payer: Self-pay | Admitting: Maternal Newborn

## 2018-09-21 VITALS — BP 120/60 | Wt 154.0 lb

## 2018-09-21 DIAGNOSIS — O0990 Supervision of high risk pregnancy, unspecified, unspecified trimester: Secondary | ICD-10-CM

## 2018-09-21 DIAGNOSIS — Z3481 Encounter for supervision of other normal pregnancy, first trimester: Secondary | ICD-10-CM

## 2018-09-21 DIAGNOSIS — Z3A08 8 weeks gestation of pregnancy: Secondary | ICD-10-CM

## 2018-09-21 DIAGNOSIS — Z369 Encounter for antenatal screening, unspecified: Secondary | ICD-10-CM

## 2018-09-21 LAB — POCT URINALYSIS DIPSTICK OB
Glucose, UA: NEGATIVE
PROTEIN: NEGATIVE

## 2018-09-21 LAB — OB RESULTS CONSOLE VARICELLA ZOSTER ANTIBODY, IGG: Varicella: IMMUNE

## 2018-09-21 NOTE — Progress Notes (Signed)
09/21/2018   Chief Complaint: Amenorrhea, positive home pregnancy test, desires prenatal care.  Transfer of Care Patient: no  History of Present Illness: [name] is a 22 y.o. G2P0010 at 8w 1d based on Patient's last menstrual period on 07/26/2018 (exact date), with an Estimated Date of Delivery: 05/02/2019, with the above CC.   Her periods were: regular periods every 28 days, lasting for 6 days She was using no method when she conceived.  She has Positive signs or symptoms of nausea/vomiting of pregnancy. She has Negative signs or symptoms of miscarriage or preterm labor She identifies Negative Zika risk factors for her and her partner On any different medications around the time she conceived/early pregnancy: She was taking Keppra and Lamictal before pregnancy for seizures and states that she is not taking these medications now. History of varicella: No   Review of Systems  Constitutional: Negative.   HENT: Negative.   Eyes: Negative.   Respiratory: Negative for cough, shortness of breath and wheezing.   Cardiovascular: Negative for chest pain and palpitations.  Gastrointestinal: Positive for nausea.  Genitourinary: Negative.   Musculoskeletal: Negative.   Skin: Negative.   Neurological: Positive for seizures.       Last seizure one month ago  Endo/Heme/Allergies: Negative.   Psychiatric/Behavioral: Negative.    ROS: Review of systems was otherwise negative, except as stated in the above HPI.  OBGYN History: As per HPI. OB History  Gravida Para Term Preterm AB Living  2 0 0 0 1 0  SAB TAB Ectopic Multiple Live Births  1 0 0 0      # Outcome Date GA Lbr Len/2nd Weight Sex Delivery Anes PTL Lv  2 Current           1 SAB             Any issues with any prior pregnancies: SAB with G1. Any prior children are healthy, doing well, without any problems or issues: not applicable History of pap smears: No. Not indicated, age < 21 years. History of STIs: Yes, chlamydia     Past Medical History: Past Medical History:  Diagnosis Date  . Seizures (HCC)    last at age 44; stopped taking AED's in Spring 2014.    Past Surgical History: History reviewed. No pertinent surgical history.  Family History:  Family History  Problem Relation Age of Onset  . Hypertension Father   . Cancer Maternal Uncle        breast  . Cancer Maternal Grandmother        breast  . Hypertension Paternal Grandmother   . COPD Neg Hx   . Diabetes Neg Hx   . Heart disease Neg Hx   . Stroke Neg Hx    She denies any female cancers, bleeding or blood clotting disorders.  She denies any history of intellectual disability, birth defects or genetic disorders in her or the FOB's history  Social History:  Social History   Socioeconomic History  . Marital status: Single    Spouse name: Not on file  . Number of children: Not on file  . Years of education: 76  . Highest education level: Not on file  Occupational History  . Occupation: RETAIL Investment banker, corporate: [id number]    Comment: FULL TIME  Social Needs  . Financial resource strain: Not on file  . Food insecurity:    Worry: Not on file    Inability: Not on file  . Transportation needs:  Medical: Not on file    Non-medical: Not on file  Tobacco Use  . Smoking status: Never Smoker  . Smokeless tobacco: Never Used  Substance and Sexual Activity  . Alcohol use: No  . Drug use: No  . Sexual activity: Yes    Birth control/protection: Condom, None  Lifestyle  . Physical activity:    Days per week: Not on file    Minutes per session: Not on file  . Stress: Not on file  Relationships  . Social connections:    Talks on phone: Not on file    Gets together: Not on file    Attends religious service: Not on file    Active member of club or organization: Not on file    Attends meetings of clubs or organizations: Not on file    Relationship status: Not on file  . Intimate partner violence:    Fear of current or ex partner:  Not on file    Emotionally abused: Not on file    Physically abused: Not on file    Forced sexual activity: Not on file  Other Topics Concern  . Not on file  Social History Narrative  . Not on file   Any cats in the household: no Domestic violence screening negative.  Allergy: No Known Allergies  Current Outpatient Medications: No current outpatient medications on file.   Physical Exam:   BP 120/60   Wt 154 lb (69.9 kg)   LMP 07/26/2018 (Exact Date)   BMI 25.63 kg/m  Body mass index is 25.63 kg/m. Constitutional: Well nourished, well developed female in no acute distress.  Neck:  Supple, normal appearance, and no thyromegaly  Cardiovascular: S1, S2 normal, no murmur, rub or gallop, regular rate and rhythm Respiratory:  Clear to auscultation bilaterally. Normal respiratory effort Abdomen: positive bowel sounds and no masses, hernias; diffusely non tender to palpation, non distended Breasts: declines breast exam Neuro/Psych:  Normal mood and affect.  Skin:  Warm and dry.  Lymphatic:  No inguinal lymphadenopathy.   Pelvic exam: is not limited by body habitus External genitalia, Bartholin's glands, Urethra, Skene's glands: within normal limits Vagina: within normal limits and with no blood in the vault  Cervix: normal appearing cervix without discharge or lesions, closed/long/high Uterus:  normal contour Adnexa:  no mass, fullness, tenderness  Assessment: Heather [name] is a 22 y.o. G1P0010 at [redacted]w[redacted]d based on Patient's last menstrual period on09/19/2019 (exact date), with an Estimated Date of Delivery: 05/02/2019, presenting for prenatal care.  Plan:  1) Avoid alcoholic beverages. 2) Patient encouraged not to smoke.  3) Discontinue the use of all non-medicinal drugs and chemicals.  4) Take prenatal vitamins daily.  5) Seatbelt use advised 6) Nutrition, food safety (fish, cheese advisories, and high nitrite foods) and exercise discussed. 7) Hospital and practice style  delivering at [facility] discussed  8) Patient is asked about travel to areas at risk for the Zika virus, and counseled to avoid travel and exposure to mosquitoes or sexual partners who may have themselves been exposed to the virus. Testing is discussed, and will be ordered as appropriate.  9) Childbirth classes at [facility] advised 10) Genetic Screening, such as with 1st Trimester Screening, cell free fetal DNA, AFP testing, and Ultrasound, as well as with amniocentesis and CVS as appropriate, is discussed with patient. She plans to decline genetic testing this pregnancy. 11) Strongly emphasized need to see neurology to discuss pregnancy regimen for seizure medications and danger of not taking any medications to  control seizures. Patient agrees and says that she will be able to get an appointment with her neurologist. Last documented visit to neurology in the record was on 03/01/2018. 12) Dating scan ordered.  Problem list reviewed and updated.  Return in about 3 days (around 09/24/2018) for ROB with dating scan.  Heather [name], CNM [facility] 09/21/2018  2:23 PM

## 2018-09-21 NOTE — Progress Notes (Signed)
C/O seizure medicine - takes capra and something else she doesn't know the name of - total of 10 pills a day.rj

## 2018-09-23 DIAGNOSIS — O0990 Supervision of high risk pregnancy, unspecified, unspecified trimester: Secondary | ICD-10-CM | POA: Insufficient documentation

## 2018-09-23 LAB — URINE CULTURE

## 2018-09-24 ENCOUNTER — Encounter: Payer: Medicaid Other | Admitting: Obstetrics and Gynecology

## 2018-09-24 ENCOUNTER — Other Ambulatory Visit: Payer: Medicaid Other

## 2018-09-24 LAB — RPR+RH+ABO+RUB AB+AB SCR+CB...
Antibody Screen: NEGATIVE
HEMOGLOBIN: 12 g/dL (ref 11.1–15.9)
HIV Screen 4th Generation wRfx: NONREACTIVE
Hematocrit: 36.3 % (ref 34.0–46.6)
Hepatitis B Surface Ag: NEGATIVE
MCH: 30.4 pg (ref 26.6–33.0)
MCHC: 33.1 g/dL (ref 31.5–35.7)
MCV: 92 fL (ref 79–97)
Platelets: 257 10*3/uL (ref 150–450)
RBC: 3.95 x10E6/uL (ref 3.77–5.28)
RDW: 12.3 % (ref 12.3–15.4)
RPR Ser Ql: NONREACTIVE
Rh Factor: POSITIVE
Rubella Antibodies, IGG: <0.9 index — ABNORMAL LOW (ref >0.99)
VARICELLA: 168 {index} (ref >165)
WBC: 13.5 10*3/uL — AB (ref 3.4–10.8)

## 2018-09-24 LAB — HEMOGLOBINOPATHY EVALUATION
HGB C: 0 %
HGB S: 0 %
HGB VARIANT: 0 %
Hemoglobin A2 Quantitation: 2.1 % (ref 1.8–3.2)
Hemoglobin F Quantitation: 0 % (ref 0.0–2.0)
Hgb A: 97.9 % (ref 96.4–98.8)

## 2018-09-25 LAB — CERVICOVAGINAL ANCILLARY ONLY
Chlamydia: NEGATIVE
Neisseria Gonorrhea: NEGATIVE

## 2018-09-27 ENCOUNTER — Encounter: Payer: Medicaid Other | Admitting: Maternal Newborn

## 2018-09-27 ENCOUNTER — Ambulatory Visit: Payer: Medicaid Other

## 2018-09-28 LAB — URINE DRUG PANEL 7
Amphetamines, Urine: NEGATIVE ng/mL
Barbiturate Quant, Ur: NEGATIVE ng/mL
Benzodiazepine Quant, Ur: NEGATIVE ng/mL
Cannabinoid Quant, Ur: POSITIVE — AB
Cocaine (Metab.): NEGATIVE ng/mL
Opiate Quant, Ur: NEGATIVE ng/mL
PCP Quant, Ur: NEGATIVE ng/mL

## 2018-09-30 ENCOUNTER — Emergency Department
Admission: EM | Admit: 2018-09-30 | Discharge: 2018-09-30 | Disposition: A | Discharge status: Home / Self Care | Payer: Medicaid Other | Attending: Emergency Medicine | Admitting: Emergency Medicine

## 2018-09-30 ENCOUNTER — Encounter: Payer: Self-pay | Admitting: Emergency Medicine

## 2018-09-30 ENCOUNTER — Emergency Department: Payer: Medicaid Other

## 2018-09-30 DIAGNOSIS — R109 Unspecified abdominal pain: Secondary | ICD-10-CM | POA: Diagnosis not present

## 2018-09-30 DIAGNOSIS — O26891 Other specified pregnancy related conditions, first trimester: Secondary | ICD-10-CM | POA: Diagnosis present

## 2018-09-30 DIAGNOSIS — Z3A09 9 weeks gestation of pregnancy: Secondary | ICD-10-CM | POA: Insufficient documentation

## 2018-09-30 DIAGNOSIS — Z87891 Personal history of nicotine dependence: Secondary | ICD-10-CM | POA: Insufficient documentation

## 2018-09-30 DIAGNOSIS — O26899 Other specified pregnancy related conditions, unspecified trimester: Secondary | ICD-10-CM

## 2018-09-30 LAB — URINALYSIS, COMPLETE (UACMP) WITH MICROSCOPIC
BILIRUBIN URINE: NEGATIVE
GLUCOSE, UA: NEGATIVE mg/dL
HGB URINE DIPSTICK: NEGATIVE
KETONES UR: NEGATIVE mg/dL
LEUKOCYTES UA: NEGATIVE
NITRITE: NEGATIVE
Protein, ur: NEGATIVE mg/dL
Specific Gravity, Urine: 1.023 (ref 1.005–1.030)
pH: 6 (ref 5.0–8.0)

## 2018-09-30 LAB — CBC
HCT: 35.2 % — ABNORMAL LOW (ref 36.0–46.0)
HEMOGLOBIN: 11.8 g/dL — AB (ref 12.0–15.0)
MCH: 31.4 pg (ref 26.0–34.0)
MCHC: 33.5 g/dL (ref 30.0–36.0)
MCV: 93.6 fL (ref 80.0–100.0)
NRBC: 0 % (ref 0.0–0.2)
Platelets: 242 10*3/uL (ref 150–400)
RBC: 3.76 MIL/uL — AB (ref 3.87–5.11)
RDW: 12.6 % (ref 11.5–15.5)
WBC: 12.1 10*3/uL — ABNORMAL HIGH (ref 4.0–10.5)

## 2018-09-30 LAB — COMPREHENSIVE METABOLIC PANEL
ALT: 14 U/L (ref 0–44)
ANION GAP: 8 (ref 5–15)
AST: 18 U/L (ref 15–41)
Albumin: 3.7 g/dL (ref 3.5–5.0)
Alkaline Phosphatase: 56 U/L (ref 38–126)
BUN: 12 mg/dL (ref 6–20)
CHLORIDE: 103 mmol/L (ref 98–111)
CO2: 24 mmol/L (ref 22–32)
Calcium: 8.9 mg/dL (ref 8.9–10.3)
Creatinine, Ser: 0.75 mg/dL (ref 0.44–1.00)
GFR calc Af Amer: >60 mL/min (ref >60)
Glucose, Bld: 77 mg/dL (ref 70–99)
POTASSIUM: 3.5 mmol/L (ref 3.5–5.1)
Sodium: 135 mmol/L (ref 135–145)
Total Bilirubin: 0.9 mg/dL (ref 0.3–1.2)
Total Protein: 6.4 g/dL — ABNORMAL LOW (ref 6.5–8.1)

## 2018-09-30 LAB — POCT PREGNANCY, URINE: Preg Test, Ur: POSITIVE — AB

## 2018-09-30 NOTE — ED Provider Notes (Signed)
[facility] Emergency Department Provider Note  ____________________________________________  Time seen: Approximately 10:52 PM  I have reviewed the triage vital signs and the nursing notes.   HISTORY  Chief Complaint Abdominal Pain   HPI Heather [name] is a 22 y.o. female with a history of seizures and medication noncompliance who presents for evaluation of abdominal pain after having a seizure.  Patient reports that she found out she was pregnant several weeks ago.  She thinks she is [redacted] weeks pregnant.  This is her first pregnancy.  She has not establish care for this pregnancy.  She reports that she does not take her antiseizure medications as she is supposed to.  She takes it occasionally.  She usually has 2 seizures every month.  On Friday she had a seizure but did not sustain any injury.  She reports that today she was at work and she had a sharp pain in the suprapubic region that lasted several minutes and went away on its own.  She was concerned that she might had hit her abdomen during the seizure and was concerned about the baby.  She has no pain at this time.  No vaginal discharge, dysuria, hematuria, fever, chills, vaginal bleeding, nausea, vomiting, diarrhea.  Past Medical History:  Diagnosis Date  . Seizures (HCC)    last at age 22; stopped taking AED's in Spring 2014.    Patient Active Problem List   Diagnosis Date Noted  . Supervision of high risk pregnancy, antepartum 09/23/2018  . Acne 12/06/2015  . Family history of breast cancer in female 11/30/2015  . Chlamydia infection 11/30/2015    History reviewed. No pertinent surgical history.  Prior to Admission medications   Not on File    Allergies Patient has no known allergies.  Family History  Problem Relation Age of Onset  . Hypertension Father   . Cancer Maternal Uncle        breast  . Cancer Maternal Grandmother        breast  . Hypertension Paternal Grandmother   .  COPD Neg Hx   . Diabetes Neg Hx   . Heart disease Neg Hx   . Stroke Neg Hx     Social History Social History   Tobacco Use  . Smoking status: Former Games developermoker  . Smokeless tobacco: Never Used  Substance Use Topics  . Alcohol use: No  . Drug use: No    Review of Systems  Constitutional: Negative for fever. Eyes: Negative for visual changes. ENT: Negative for sore throat. Neck: No neck pain  Cardiovascular: Negative for chest pain. Respiratory: Negative for shortness of breath. Gastrointestinal: + abdominal pain. No vomiting or diarrhea. Genitourinary: Negative for dysuria. Musculoskeletal: Negative for back pain. Skin: Negative for rash. Neurological: Negative for headaches, weakness or numbness. + seizure Psych: No SI or HI  ____________________________________________   PHYSICAL EXAM:  VITAL SIGNS: ED Triage Vitals  Enc Vitals Group     BP 09/30/18 2008 124/70     Pulse Rate 09/30/18 2008 80     Resp 09/30/18 2008 18     Temp 09/30/18 2008 98.4 F (36.9 C)     Temp Source 09/30/18 2008 Oral     SpO2 09/30/18 2008 100 %     Weight 09/30/18 2008 159 lb (72.1 kg)     Height 09/30/18 2008 5\' 5"  (1.651 m)     Head Circumference --      Peak Flow --  Pain Score 09/30/18 2012 6     Pain Loc --      Pain Edu? --      Excl. in GC? --     Constitutional: Alert and oriented. Well appearing and in no apparent distress. HEENT:      Head: Normocephalic and atraumatic.         Eyes: Conjunctivae are normal. Sclera is non-icteric.       Mouth/Throat: Mucous membranes are moist.       Neck: Supple with no signs of meningismus. Cardiovascular: Regular rate and rhythm. No murmurs, gallops, or rubs. 2+ symmetrical distal pulses are present in all extremities. No JVD. Respiratory: Normal respiratory effort. Lungs are clear to auscultation bilaterally. No wheezes, crackles, or rhonchi.  Gastrointestinal: Soft, non tender, and non distended with positive bowel sounds. No  rebound or guarding. Genitourinary: No CVA tenderness. Musculoskeletal: Nontender with normal range of motion in all extremities. No edema, cyanosis, or erythema of extremities. Neurologic: Normal speech and language. Face is symmetric. Moving all extremities. No gross focal neurologic deficits are appreciated. Skin: Skin is warm, dry and intact. No rash noted. Psychiatric: Mood and affect are normal. Speech and behavior are normal.  ____________________________________________   LABS (all labs ordered are listed, but only abnormal results are displayed)  Labs Reviewed  COMPREHENSIVE METABOLIC PANEL - Abnormal; Notable for the following components:      Result Value   Total Protein 6.4 (*)    All other components within normal limits  CBC - Abnormal; Notable for the following components:   WBC 12.1 (*)    RBC 3.76 (*)    Hemoglobin 11.8 (*)    HCT 35.2 (*)    All other components within normal limits  URINALYSIS, COMPLETE (UACMP) WITH MICROSCOPIC - Abnormal; Notable for the following components:   Color, Urine YELLOW (*)    APPearance HAZY (*)    Bacteria, UA RARE (*)    All other components within normal limits  POCT PREGNANCY, URINE - Abnormal; Notable for the following components:   Preg Test, Ur POSITIVE (*)    All other components within normal limits  POC URINE PREG, ED   ____________________________________________  EKG  none  ____________________________________________  RADIOLOGY  I have personally reviewed the images performed during this visit and I agree with the Radiologist's read.   Interpretation by Radiologist:  US Ob Comp Less 14 Wks  Result Date: 09/30/2018 CLINICAL DATA:  Fall. Abdominal pain. Gestational age by LMP of 9 weeks 3 days. Insert initial EXAM: OBSTETRIC <14 WK ULTRASOUND TECHNIQUE: Transabdominal ultrasound was performed for evaluation of the gestation as well as the maternal uterus and adnexal regions. COMPARISON:  None. FINDINGS:  Intrauterine gestational sac: Single Yolk sac:  Visualized. Embryo:  Visualized. Cardiac Activity: Visualized. Heart Rate: 165 bpm CRL:   56 mm   12 w 1 d                  [place] EDC: 04/13/2019 Subchorionic hemorrhage:  None visualized. Maternal uterus/adnexae: Normal appearance of both ovaries. No mass or abnormal free fluid identified. IMPRESSION: Single living IUP measuring 12 weeks 1 day, with [place] EDC of 04/13/2019. No significant maternal uterine or adnexal abnormality identified. Electronically Signed   By: Myles Rosenthal M.D.   On: 09/30/2018 21:27     ____________________________________________   PROCEDURES  Procedure(s) performed: None Procedures Critical Care performed:  None ____________________________________________   INITIAL IMPRESSION / ASSESSMENT AND PLAN / ED COURSE  22 y.o.  female with a history of seizures and medication noncompliance who presents for evaluation of abdominal pain after having a seizure.  Patient had one episode of short-lived abdominal pain earlier today.  She has no pain at this time.  Her abdomen is soft with no tenderness.  Transvaginal ultrasound confirms a 12-week 1 day single IUP with no other complications.  Her labs are within normal limits.  Offer STD testing patient has declined.  Recommended follow-up with OB/GYN for prenatal care.  Discussed standard return precautions.  We discussed the importance of being on her seizure medications especially during the pregnancy.  Discussed seizure precautions with patient.      As part of my medical decision making, I reviewed the following data within the electronic MEDICAL RECORD NUMBER Nursing notes reviewed and incorporated, Labs reviewed , Old chart reviewed, Radiograph reviewed , Notes from prior ED visits and [place] Controlled Substance Database    Pertinent labs & imaging results that were available during my care of the patient were reviewed by me and considered in my medical decision making (see chart for  details).    ____________________________________________   FINAL CLINICAL IMPRESSION(S) / ED DIAGNOSES  Final diagnoses:  Abdominal pain during pregnancy in first trimester      NEW MEDICATIONS STARTED DURING THIS VISIT:  ED Discharge Orders    None       Note:  This document was prepared using Dragon voice recognition software and may include unintentional dictation errors.    Nita Sickle, MD 09/30/18 2259

## 2018-09-30 NOTE — ED Triage Notes (Signed)
Patient states that she has a history of seizures and had a seizure on Friday and fell. Patient states that she is [redacted] weeks pregnant and that she has had lower abdominal pain since Saturday. Patient denies vaginal bleeding.

## 2018-10-12 ENCOUNTER — Ambulatory Visit (INDEPENDENT_AMBULATORY_CARE_PROVIDER_SITE_OTHER): Payer: Medicaid Other

## 2018-10-12 ENCOUNTER — Ambulatory Visit (INDEPENDENT_AMBULATORY_CARE_PROVIDER_SITE_OTHER): Payer: Medicaid Other | Admitting: Maternal Newborn

## 2018-10-12 ENCOUNTER — Ambulatory Visit: Payer: Medicaid Other

## 2018-10-12 ENCOUNTER — Other Ambulatory Visit: Payer: Self-pay | Admitting: Maternal Newborn

## 2018-10-12 VITALS — BP 108/64 | Wt 157.0 lb

## 2018-10-12 DIAGNOSIS — N8311 Corpus luteum cyst of right ovary: Secondary | ICD-10-CM

## 2018-10-12 DIAGNOSIS — Z3A13 13 weeks gestation of pregnancy: Secondary | ICD-10-CM

## 2018-10-12 DIAGNOSIS — Z3481 Encounter for supervision of other normal pregnancy, first trimester: Secondary | ICD-10-CM

## 2018-10-12 DIAGNOSIS — Z3491 Encounter for supervision of normal pregnancy, unspecified, first trimester: Secondary | ICD-10-CM

## 2018-10-12 DIAGNOSIS — O3481 Maternal care for other abnormalities of pelvic organs, first trimester: Secondary | ICD-10-CM

## 2018-10-12 DIAGNOSIS — O0990 Supervision of high risk pregnancy, unspecified, unspecified trimester: Secondary | ICD-10-CM

## 2018-10-12 LAB — POCT URINALYSIS DIPSTICK OB
Glucose, UA: NEGATIVE
POC,PROTEIN,UA: NEGATIVE

## 2018-10-12 NOTE — Progress Notes (Signed)
    Routine Prenatal Care Visit  Subjective  Heather [name] is a 22 y.o. G2P0010 at 2727w1d being seen today for ongoing prenatal care.  She is currently monitored for the following issues for this high-risk pregnancy and has Family history of breast cancer in female; Chlamydia infection; Acne; and Supervision of high risk pregnancy, antepartum on their problem list. ----------------------------------------------------------------------------------- Patient reports occasional dizziness.   Vag. Bleeding: None. ----------------------------------------------------------------------------------- The following portions of the patient's history were reviewed and updated as appropriate: allergies, current medications, past family history, past medical history, past social history, past surgical history and problem list. Problem list updated.   Objective  Blood pressure 108/64, weight 157 lb (71.2 kg), last menstrual period 07/26/2018.  Pregravid weight 140 lb (63.5 kg) Total Weight Gain 17 lb (7.711 kg) Body mass index is 26.13 kg/m.   Urinalysis: Protein Negative, Glucose Negative Fetal Status: Fetal Heart Rate (bpm): 154         General:  Alert, oriented and cooperative. Patient is in no acute distress.  Skin: Skin is warm and dry. No rash noted.   Cardiovascular: Normal heart rate noted  Respiratory: Normal respiratory effort, no problems with respiration noted  Abdomen: Soft, gravid, appropriate for gestational age. Pain/Pressure: Absent     Pelvic:  Cervical exam deferred        Extremities: Normal range of motion.     Mental Status: Normal mood and affect. Normal behavior. Normal judgment and thought content.    Assessment   22 y.o. G2P0010 at 5427w1d, EDD 05/02/2019 by Last Menstrual Period presenting for a routine prenatal visit.  Plan   SECOND Problems (from 09/21/18 to present)    Problem Noted Resolved   Supervision of high risk pregnancy, antepartum 09/23/2018 by Oswaldo ConroySchmid,  Russell Engelstad Y, CNM No   Overview Signed 10/12/2018 12:24 PM by Oswaldo ConroySchmid, Guss Farruggia Y, CNM    Clinic [facility] Prenatal Labs  Dating  Blood type: O/Positive/-- (11/15 1452)   Genetic Screen 1 Screen:    AFP:     Quad:     NIPS: Antibody:Negative (11/15 1452)  Anatomic US  Rubella: <0.90 (11/15 1452) Varicella:    GTT Early:               Third trimester:  RPR: Non Reactive (11/15 1452)   Rhogam  HBsAg: Negative (11/15 1452)   TDaP vaccine                       Flu Shot: HIV: Non Reactive (11/15 1452)   Baby Food                                GBS:   Contraception  Pap:  CBB     CS/VBAC    Support Person               Appointment with neurology on November 22, 2018. Marland Kitchen. Please refer to After Visit Summary for other counseling recommendations.   Return in about 2 weeks (around 10/26/2018) for HROB/MD only this visit.  Marcelyn BruinsJacelyn Tarig Zimmers, CNM 10/12/2018  12:44 PM

## 2018-10-12 NOTE — Progress Notes (Signed)
ROB and Dating scan today- no concerns

## 2018-10-15 ENCOUNTER — Encounter: Payer: Self-pay | Admitting: Maternal Newborn

## 2018-10-15 NOTE — Patient Instructions (Signed)

## 2018-10-26 ENCOUNTER — Encounter: Payer: Self-pay | Admitting: Obstetrics and Gynecology

## 2018-10-26 ENCOUNTER — Ambulatory Visit (INDEPENDENT_AMBULATORY_CARE_PROVIDER_SITE_OTHER): Payer: Medicaid Other | Admitting: Obstetrics and Gynecology

## 2018-10-26 VITALS — BP 114/70 | Wt 165.0 lb

## 2018-10-26 DIAGNOSIS — Z1379 Encounter for other screening for genetic and chromosomal anomalies: Secondary | ICD-10-CM

## 2018-10-26 DIAGNOSIS — O0990 Supervision of high risk pregnancy, unspecified, unspecified trimester: Secondary | ICD-10-CM

## 2018-10-26 NOTE — Progress Notes (Signed)
  Routine Prenatal Care Visit  Subjective  Heather [name] is a 22 y.o. G2P0010 at 2748w6d being seen today for ongoing prenatal care.  She is currently monitored for the following issues for this high-risk pregnancy and has Family history of breast cancer in female; Chlamydia infection; Acne; Supervision of high risk pregnancy, antepartum; and Seizure (HCC) on their problem list.  ----------------------------------------------------------------------------------- Patient reports no complaints.    . Vag. Bleeding: None.  Movement: Absent. Denies leaking of fluid.  ----------------------------------------------------------------------------------- The following portions of the patient's history were reviewed and updated as appropriate: allergies, current medications, past family history, past medical history, past social history, past surgical history and problem list. Problem list updated.   Objective  Blood pressure 114/70, weight 165 lb (74.8 kg), last menstrual period 07/26/2018. Pregravid weight 140 lb (63.5 kg) Total Weight Gain 25 lb (11.3 kg) Urinalysis: Urine Protein    Urine Glucose    Fetal Status: Fetal Heart Rate (bpm): 160   Movement: Absent     General:  Alert, oriented and cooperative. Patient is in no acute distress.  Skin: Skin is warm and dry. No rash noted.   Cardiovascular: Normal heart rate noted  Respiratory: Normal respiratory effort, no problems with respiration noted  Abdomen: Soft, gravid, appropriate for gestational age. Pain/Pressure: Absent     Pelvic:  Cervical exam deferred        Extremities: Normal range of motion.     Mental Status: Normal mood and affect. Normal behavior. Normal judgment and thought content.   Assessment   22 y.o. G2P0010 at 2848w6d by  04/13/2019, by Ultrasound presenting for routine prenatal visit  Plan   SECOND Problems (from 09/21/18 to present)    Problem Noted Resolved   Supervision of high risk pregnancy, antepartum  09/23/2018 by Oswaldo ConroySchmid, Jacelyn Y, CNM No   Overview Signed 10/12/2018 12:24 PM by Oswaldo ConroySchmid, Jacelyn Y, CNM    Clinic [facility] Prenatal Labs  Dating  Blood type: O/Positive/-- (11/15 1452)   Genetic Screen 1 Screen:    AFP:     Quad:     NIPS: Antibody:Negative (11/15 1452)  Anatomic US  Rubella: <0.90 (11/15 1452) Varicella:    GTT Early:               Third trimester:  RPR: Non Reactive (11/15 1452)   Rhogam  HBsAg: Negative (11/15 1452)   TDaP vaccine                       Flu Shot: HIV: Non Reactive (11/15 1452)   Baby Food                                GBS:   Contraception  Pap:  CBB     CS/VBAC    Support Person                  Preterm labor symptoms and general obstetric precautions including but not limited to vaginal bleeding, contractions, leaking of fluid and fetal movement were reviewed in detail with the patient. Please refer to After Visit Summary for other counseling recommendations.   - NIPT today - appt with neuro next week.   Return in about 4 weeks (around 11/23/2018) for Anatomy u/s and routine prenatal.  Thomasene MohairStephen Jasha Hodzic, MD, Merlinda FrederickFACOG [facility], [facility] 10/26/2018 2:15 PM

## 2018-11-02 LAB — MATERNIT 21 PLUS CORE, BLOOD
CHROMOSOME 13: NEGATIVE
CHROMOSOME 18: NEGATIVE
Chromosome 21: NEGATIVE
Fetal Fraction: 11
Y Chromosome: DETECTED

## 2018-11-07 NOTE — L&D Delivery Note (Addendum)
Delivery Note Primary OB: [facility] Provider: [name], CNM Gestational Age: Full term Antepartum complications: pregnancy-induced hypertension Intrapartum complications: None  A viable Female was delivered via vertex presentation, LOA. No nuchal cord was present. Delivery of the shoulders and body followed without difficulty. The infant was placed on the maternal abdomen. The umbilical cord was doubly clamped and cut following delayed cord clamping. Cord blood was collected.  The placenta was delivered spontaneously and was inspected and found to be intact with a three vessel cord. The cervix and vagina were inspected. There was a right vaginal abrasion that was hemostatic and did not require repair. There were no perineal lacerations. The fundus was firm. Patient and infant were bonding in stable condition. All counts were correct.  Apgars: 8, 9  Weight:  6 lb 6 oz.    Anesthesia:  epidural Episiotomy:  none Lacerations:  vaginal abrasion Suture Repair: none Quantitative Blood Loss (mL):  110  Mom to postpartum.  Baby to Couplet care / Skin to Skin.  [name], CNM [facility], [facility] 03/30/2019  1:23 PM  Attestation of Attending Supervision of Advanced Practitioner (CNM):  Evaluation and management procedures were performed by the Advanced Practitioner under my supervision and collaboration.  Although I was not present for the delivery itself,  I have reviewed the Advanced Practitioner's note and chart, and I agree with the management and plan.  Annamarie Major, MD, [facility] Frederick [facility], [facility] 03/31/2019  10:27 AM

## 2018-11-18 ENCOUNTER — Emergency Department
Admission: EM | Admit: 2018-11-18 | Discharge: 2018-11-18 | Discharge status: Against Medical Advice | Payer: Medicaid Other | Attending: Emergency Medicine | Admitting: Emergency Medicine

## 2018-11-18 ENCOUNTER — Other Ambulatory Visit: Payer: Self-pay

## 2018-11-18 DIAGNOSIS — Z3492 Encounter for supervision of normal pregnancy, unspecified, second trimester: Secondary | ICD-10-CM

## 2018-11-18 DIAGNOSIS — N342 Other urethritis: Secondary | ICD-10-CM | POA: Insufficient documentation

## 2018-11-18 DIAGNOSIS — R300 Dysuria: Secondary | ICD-10-CM | POA: Diagnosis present

## 2018-11-18 DIAGNOSIS — R569 Unspecified convulsions: Secondary | ICD-10-CM

## 2018-11-18 LAB — BASIC METABOLIC PANEL
Anion gap: 5 (ref 5–15)
BUN: 9 mg/dL (ref 6–20)
CALCIUM: 8.5 mg/dL — AB (ref 8.9–10.3)
CHLORIDE: 108 mmol/L (ref 98–111)
CO2: 22 mmol/L (ref 22–32)
Creatinine, Ser: 0.58 mg/dL (ref 0.44–1.00)
GFR calc non Af Amer: >60 mL/min (ref >60)
GLUCOSE: 82 mg/dL (ref 70–99)
Potassium: 3.8 mmol/L (ref 3.5–5.1)
Sodium: 135 mmol/L (ref 135–145)

## 2018-11-18 LAB — URINALYSIS, COMPLETE (UACMP) WITH MICROSCOPIC
Bilirubin Urine: NEGATIVE
Glucose, UA: NEGATIVE mg/dL
HGB URINE DIPSTICK: NEGATIVE
Ketones, ur: NEGATIVE mg/dL
Leukocytes, UA: NEGATIVE
NITRITE: NEGATIVE
PROTEIN: NEGATIVE mg/dL
SPECIFIC GRAVITY, URINE: 1.013 (ref 1.005–1.030)
pH: 7 (ref 5.0–8.0)

## 2018-11-18 LAB — CBC
HCT: 34.1 % — ABNORMAL LOW (ref 36.0–46.0)
HEMOGLOBIN: 11.5 g/dL — AB (ref 12.0–15.0)
MCH: 31.8 pg (ref 26.0–34.0)
MCHC: 33.7 g/dL (ref 30.0–36.0)
MCV: 94.2 fL (ref 80.0–100.0)
Platelets: 212 10*3/uL (ref 150–400)
RBC: 3.62 MIL/uL — ABNORMAL LOW (ref 3.87–5.11)
RDW: 13.2 % (ref 11.5–15.5)
WBC: 10.1 10*3/uL (ref 4.0–10.5)
nRBC: 0 % (ref 0.0–0.2)

## 2018-11-18 NOTE — ED Notes (Signed)
Pt walked out of room and began to leave. Pt did not respond when called out to. Pt left after being seen by MD.

## 2018-11-18 NOTE — ED Triage Notes (Addendum)
Pt comes via POV from home with c/o seizure. Pt states hx of seizures but doesn't take any medications for it. Pt states last seizure was October or November of 2019. Pt is alert and oriented at this time and appears in NAD.  Pt states she was washing the dishes and she got dizzy and fell. Pt states she passed out and unsure if she hit her head or not.   Pt states she was out for about 5-6 minutes. Pt states she then came to and felt weak and started having some abdominal pain.  Pt also states she is 4 months pregnant and regularly sees her OBGYN.

## 2018-11-18 NOTE — ED Provider Notes (Signed)
[facility] Emergency Department Provider Note  ____________________________________________  Time seen: Approximately 4:47 PM  I have reviewed the triage vital signs and the nursing notes.   HISTORY  Chief Complaint Seizures    HPI Heather [name] is a 23 y.o. female with a history of seizures and about [redacted] weeks pregnant, following up with [facility], who reports a seizure today.  She had previously been on Keppra and lamotrigine but stopped taking all of it many months ago because she felt like it was too many pills to manage.  Today she was in her usual state of health, washing dishes when she got dizzy and fell down and thinks that she had a seizure.  Denies any pain anywhere.  No contractions vaginal bleeding or leakage of fluid.  Last saw [facility] a few weeks ago.  They did not address antiepileptic medications at that time.  She made a follow-up appointment with neurology which is not for another 3 or 4 weeks.  She reports she has been eating normally and drinking fluids and has no other acute medical concerns.  She is currently asymptomatic.      Past Medical History:  Diagnosis Date  . Seizures (HCC)    last at age 23; stopped taking AED's in Spring 2014.     Patient Active Problem List   Diagnosis Date Noted  . Supervision of high risk pregnancy, antepartum 09/23/2018  . Seizure (HCC) 02/28/2018  . Acne 12/06/2015  . Family history of breast cancer in female 11/30/2015  . Chlamydia infection 11/30/2015     History reviewed. No pertinent surgical history.   Prior to Admission medications   Not on File     Allergies Patient has no known allergies.   Family History  Problem Relation Age of Onset  . Hypertension Father   . Cancer Maternal Uncle        breast  . Cancer Maternal Grandmother        breast  . Hypertension Paternal Grandmother   . COPD Neg Hx   . Diabetes Neg Hx   . Heart disease Neg Hx   . Stroke Neg Hx      Social History Social History   Tobacco Use  . Smoking status: Former Games developermoker  . Smokeless tobacco: Never Used  Substance Use Topics  . Alcohol use: No  . Drug use: No    Review of Systems  Constitutional:   No fever or chills.  ENT:   No sore throat. No rhinorrhea. Cardiovascular:   No chest pain or syncope. Respiratory:   No dyspnea or cough. Gastrointestinal:   Negative for abdominal pain, vomiting and diarrhea.  Musculoskeletal:   Negative for focal pain or swelling All other systems reviewed and are negative except as documented above in ROS and HPI.  ____________________________________________   PHYSICAL EXAM:  VITAL SIGNS: ED Triage Vitals  Enc Vitals Group     BP 11/18/18 1615 116/68     Pulse Rate 11/18/18 1615 76     Resp 11/18/18 1615 16     Temp --      Temp src --      SpO2 11/18/18 1615 100 %     Weight 11/18/18 1310 165 lb (74.8 kg)     Height 11/18/18 1310 5' 5.5" (1.664 m)     Head Circumference --      Peak Flow --      Pain Score 11/18/18 1309 3     Pain Loc --  Pain Edu? --      Excl. in GC? --     Vital signs reviewed, nursing assessments reviewed.   Constitutional:   Alert and oriented. Non-toxic appearance. Eyes:   Conjunctivae are normal. EOMI. PERRL. ENT      Head:   Normocephalic and atraumatic.      Nose:   No congestion/rhinnorhea.       Mouth/Throat:   MMM, no pharyngeal erythema. No peritonsillar mass.       Neck:   No meningismus. Full ROM. Hematological/Lymphatic/Immunilogical:   No cervical lymphadenopathy. Cardiovascular:   RRR. Symmetric bilateral radial and DP pulses.  No murmurs. Cap refill less than 2 seconds. Respiratory:   Normal respiratory effort without tachypnea/retractions. Breath sounds are clear and equal bilaterally. No wheezes/rales/rhonchi. Gastrointestinal:   Soft and nontender. Non distended. There is no CVA tenderness.  No rebound, rigidity, or guarding. Genitourinary:    deferred Musculoskeletal:   Normal range of motion in all extremities. No joint effusions.  No lower extremity tenderness.  No edema. Neurologic:   Normal speech and language.  Motor grossly intact. No acute focal neurologic deficits are appreciated.  Skin:    Skin is warm, dry and intact. No rash noted.  No petechiae, purpura, or bullae.  ____________________________________________    LABS (pertinent positives/negatives) (all labs ordered are listed, but only abnormal results are displayed) Labs Reviewed  CBC - Abnormal; Notable for the following components:      Result Value   RBC 3.62 (*)    Hemoglobin 11.5 (*)    HCT 34.1 (*)    All other components within normal limits  BASIC METABOLIC PANEL - Abnormal; Notable for the following components:   Calcium 8.5 (*)    All other components within normal limits  URINALYSIS, COMPLETE (UACMP) WITH MICROSCOPIC - Abnormal; Notable for the following components:   Color, Urine YELLOW (*)    APPearance CLOUDY (*)    Bacteria, UA RARE (*)    All other components within normal limits  CBG MONITORING, ED   ____________________________________________   EKG    ____________________________________________    RADIOLOGY  No results found.  ____________________________________________   PROCEDURES Procedures  ____________________________________________    CLINICAL IMPRESSION / ASSESSMENT AND PLAN / ED COURSE  Pertinent labs & imaging results that were available during my care of the patient were reviewed by me and considered in my medical decision making (see chart for details).    Patient with a seizure disorder presents with a seizure in the setting of medication noncompliance.  Vital signs are normal, she is asymptomatic, not having any headaches or vision changes, doubt preeclampsia or sinus thrombosis.  She is back to baseline.  Serum labs are unremarkable.  Urinalysis also unremarkable.  However, the patient eloped  from the ED after my evaluation and discussing the current plan with her.  I had informed her that I plan to discuss with her obstetrics clinic regarding possible antiepileptic medication we could start her on.  I did end up talking to Dr. Jean Rosenthal who agreed that Keppra would be a reasonable choice, however the patient is no longer in the ED.  She appears to be medically stable and has medical decision-making capacity.     ____________________________________________   FINAL CLINICAL IMPRESSION(S) / ED DIAGNOSES    Final diagnoses:  Seizure (HCC)  Second trimester pregnancy     ED Discharge Orders    None      Portions of this note were generated with dragon  dictation software. Dictation errors may occur despite best attempts at proofreading.   Sharman CheekStafford, Kellie Chisolm, MD 11/18/18 1700

## 2018-11-23 ENCOUNTER — Ambulatory Visit (INDEPENDENT_AMBULATORY_CARE_PROVIDER_SITE_OTHER): Payer: Medicaid Other

## 2018-11-23 ENCOUNTER — Ambulatory Visit (INDEPENDENT_AMBULATORY_CARE_PROVIDER_SITE_OTHER): Payer: Medicaid Other | Admitting: Obstetrics & Gynecology

## 2018-11-23 VITALS — BP 120/80 | Wt 175.0 lb

## 2018-11-23 DIAGNOSIS — O0990 Supervision of high risk pregnancy, unspecified, unspecified trimester: Secondary | ICD-10-CM

## 2018-11-23 DIAGNOSIS — Z3A19 19 weeks gestation of pregnancy: Secondary | ICD-10-CM

## 2018-11-23 DIAGNOSIS — R569 Unspecified convulsions: Secondary | ICD-10-CM

## 2018-11-23 DIAGNOSIS — Z362 Encounter for other antenatal screening follow-up: Secondary | ICD-10-CM

## 2018-11-23 LAB — POCT URINALYSIS DIPSTICK OB
GLUCOSE, UA: NEGATIVE
POC,PROTEIN,UA: NEGATIVE

## 2018-11-23 MED ORDER — LEVETIRACETAM 500 MG PO TABS: 500.0000 mg | ORAL_TABLET | Freq: Two times a day (BID) | ORAL | 6 refills | Status: DC

## 2018-11-23 NOTE — Patient Instructions (Signed)

## 2018-12-21 ENCOUNTER — Ambulatory Visit (INDEPENDENT_AMBULATORY_CARE_PROVIDER_SITE_OTHER): Payer: Medicaid Other

## 2018-12-21 ENCOUNTER — Ambulatory Visit (INDEPENDENT_AMBULATORY_CARE_PROVIDER_SITE_OTHER): Payer: Medicaid Other | Admitting: Obstetrics & Gynecology

## 2018-12-21 VITALS — BP 120/80 | Wt 183.0 lb

## 2018-12-21 DIAGNOSIS — O0990 Supervision of high risk pregnancy, unspecified, unspecified trimester: Secondary | ICD-10-CM

## 2018-12-21 DIAGNOSIS — Z131 Encounter for screening for diabetes mellitus: Secondary | ICD-10-CM

## 2018-12-21 DIAGNOSIS — Z362 Encounter for other antenatal screening follow-up: Secondary | ICD-10-CM | POA: Diagnosis not present

## 2018-12-21 DIAGNOSIS — Z3A23 23 weeks gestation of pregnancy: Secondary | ICD-10-CM

## 2018-12-21 DIAGNOSIS — O0992 Supervision of high risk pregnancy, unspecified, second trimester: Secondary | ICD-10-CM

## 2018-12-21 NOTE — Progress Notes (Signed)
  HPI: Pt admits to good FM; no pain or bleeding.  She reports no seizure activity since the one in January (seen in ER then here; started on Keppra then).  SHe saw Neurology last week, and they agree w Keppra therapy with no other testing or interventions recommended.  Ultrasound demonstrates completion of anatomy scan These findings are normal/ reassuring  PMHx: She  has a past medical history of Seizures (HCC). Also,  has no past surgical history on file., family history includes Cancer in her maternal grandmother and maternal uncle; Hypertension in her father and paternal grandmother.,  reports that she has quit smoking. She has never used smokeless tobacco. She reports that she does not drink alcohol or use drugs.  She has a current medication list which includes the following prescription(s): levetiracetam. Also, has No Known Allergies.  Review of Systems  All other systems reviewed and are negative.  Objective: BP 120/80   Wt 183 lb (83 kg)   LMP 07/26/2018 (Exact Date)   BMI 29.99 kg/m   Physical examination Constitutional NAD, Conversant  Skin No rashes, lesions or ulceration.   Extremities: Moves all appropriately.  Normal ROM for age. No lymphadenopathy.  Neuro: Grossly intact  Psych: Oriented to PPT.  Normal mood. Normal affect.   US Ob Follow Up  Result Date: 12/21/2018 Patient Name: Heather [name] DOB: [birth year] MRN: [id number] ULTRASOUND REPORT Location: [facility] Date of Service: 12/21/2018 Indications: Anatomy follow up ultrasound Findings: Mason Jim intrauterine pregnancy is visualized with FHR at 147 BPM. Fetal presentation is Cephalic. Placenta: anterior. Grade: 1 AFI: subjectively normal. Anatomic survey is complete for profile. There is no free peritoneal fluid in the cul de sac. Impression: 1. [redacted]w[redacted]d Viable Singleton Intrauterine pregnancy previously established criteria. 2. Normal Anatomy Scan is now complete Recommendations: 1.Clinical correlation with  the patient's History and Physical Exam. Darlina Guys, RDMS RVT Review of ULTRASOUND. I have personally reviewed images and report of recent ultrasound done at [facility]. There is a singleton gestation with subjectively normal amniotic fluid volume. The fetal biometry correlates with established dating. Detailed evaluation of the fetal anatomy was performed.The fetal anatomical survey appears within normal limits within the resolution of ultrasound as described above.  It must be noted that a normal ultrasound is unable to rule out fetal aneuploidy.  Annamarie Major, MD, FACOG [facility], [facility] 12/21/2018  8:35 AM   Assessment:  [redacted] weeks gestation of pregnancy    PNV Supervision of high risk pregnancy, antepartum    Seizure Disorder, cont Keppra and Neuro f/u Encounter for screening examination for impaired glucose regulation and diabetes mellitus - Plan: 28 Week RH+Panel next visit  Annamarie Major, MD, [facility] Frederick Ob[facility], [facility] 12/21/2018  8:44 AM

## 2018-12-21 NOTE — Progress Notes (Signed)
  Subjective  Fetal Movement? yes Contractions? no Leaking Fluid? no Vaginal Bleeding? no  Objective  BP 120/80   Wt 175 lb (79.4 kg)   LMP 07/26/2018 (Exact Date)   BMI 28.68 kg/m  General: NAD Pumonary: no increased work of breathing Abdomen: gravid, non-tender Extremities: no edema Psychiatric: mood appropriate, affect full  Assessment  22 y.o. G2P0010 at [redacted]w[redacted]d by  04/13/2019, by Ultrasound presenting for routine prenatal visit  Plan   Problem List Items Addressed This Visit      Other   Supervision of high risk pregnancy, antepartum   Seizure (HCC)   Relevant Medications   levETIRAcetam (KEPPRA) 500 MG tablet    Other Visit Diagnoses    [redacted] weeks gestation of pregnancy    -  Primary   Relevant Orders   POC Urinalysis Dipstick OB (Completed)   Encounter for other antenatal screening follow-up       Relevant Orders   US OB Follow Up (Completed)    Review of ULTRASOUND. I have personally reviewed images and report of recent ultrasound done at [facility]. There is a singleton gestation with subjectively normal amniotic fluid volume. The fetal biometry correlates with established dating. Detailed evaluation of the fetal anatomy was performed.The fetal anatomical survey appears within normal limits within the resolution of ultrasound as described above.  It must be noted that a normal ultrasound is unable to rule out fetal aneuploidy.    Annamarie Major, MD, [facility] Frederick Ob[facility], [facility] 12/21/2018  8:36 AM

## 2018-12-21 NOTE — Patient Instructions (Addendum)
Pregnancy and Epilepsy/Seizures  Epilepsy is a condition in which a person has repeated seizures over time. Epilepsy can cause problems during pregnancy, but with the right care the chances of having a normal, healthy baby are good. Most women with epilepsy have normal pregnancies and healthy babies. What kind of problems can epilepsy cause during pregnancy? The problems that may happen due to epilepsy depend on whether or not you take medicines for your condition during your pregnancy. Without treatment, epilepsy can put you and your baby at risk for certain problems, including:  More frequent seizures than usual.  Placenta problems.  Premature labor and birth.  Slowed heart rate and low oxygen supply to the baby.  Slower than normal growth in the uterus (intrauterine growth restriction). How is epilepsy treated during pregnancy? During pregnancy, treatment for epilepsy may include:  Taking a medicine that helps prevent seizures (antiepileptic drug). This medicine is commonly prescribed for women who have had seizures within several years of the pregnancy.  Taking a higher than normal doses of folic acid. Folic acid helps prevent your baby from developing spinal cord defects. Working with your health care provider and going to all your prenatal appointments is an important part of treatment. Will my medicine affect my baby? Antiepileptic medicines can affect you and your baby by increasing the risk for:  Vaginal bleeding early in pregnancy.  High blood pressure during pregnancy (preeclampsia).  Birth defects.  Labor induction. This is when you fail to go into labor, and labor has to be started for you.  A cesarean delivery.  Complications after birth. Most of the time, medicines are prescribed anyway because the risk of the medicines harming your baby is lower than the risk of a seizure harming your baby. If your health care provider prescribes this medicine, he or she will  prescribe the safest kind of medicine at the lowest dose that will still prevent seizures. You will also have blood tests often to make sure that the medicine is at a safe level. What if I have a seizure while I am pregnant? Women with epilepsy are more likely to have seizures during pregnancy due to hormonal changes, stress, or a lower dose of seizure medicine. It is important to carefully follow your medicine and seizure-control plans during pregnancy. If you have a seizure during your pregnancy, your risk for early delivery may increase. Will I be able to breastfeed my baby? Women with epilepsy are encouraged to breastfeed. The antiepileptic drug may pass through your breast milk in small amounts, but the amount is usually not enough to affect your baby. Contact a health care provider if:  You think you are having seizures.  You do not feel the baby moving as much as usual.  You develop tiredness or weakness.  You feel like you are going to faint. Get help right away if:  You have very bad abdominal pain.  You have vaginal bleeding.  You do not feel the baby moving.  You have very bad headaches.  You have vision problems.  A part of your body feels numb.  You cannot stop vomiting. Summary  Epilepsy is a condition in which a person has repeated seizures over time.  Most women with epilepsy have normal pregnancies and healthy babies.  Follow your healthcare provider's recommendations regarding management and treatment of epilepsy during pregnancy. This information is not intended to replace advice given to you by your health care provider. Make sure you discuss any questions you have with  your health care provider. Document Released: 10/21/2000 Document Revised: 01/04/2017 Document Reviewed: 01/04/2017 Elsevier Interactive Patient Education  2019 [employer].

## 2019-01-18 ENCOUNTER — Other Ambulatory Visit: Payer: Medicaid Other

## 2019-01-18 ENCOUNTER — Encounter: Payer: Medicaid Other | Admitting: Maternal Newborn

## 2019-01-23 ENCOUNTER — Ambulatory Visit (INDEPENDENT_AMBULATORY_CARE_PROVIDER_SITE_OTHER): Payer: Medicaid Other | Admitting: Obstetrics & Gynecology

## 2019-01-23 ENCOUNTER — Other Ambulatory Visit: Payer: Self-pay

## 2019-01-23 ENCOUNTER — Other Ambulatory Visit: Payer: Medicaid Other

## 2019-01-23 VITALS — BP 120/80 | Wt 196.0 lb

## 2019-01-23 DIAGNOSIS — Z131 Encounter for screening for diabetes mellitus: Secondary | ICD-10-CM

## 2019-01-23 DIAGNOSIS — R569 Unspecified convulsions: Secondary | ICD-10-CM

## 2019-01-23 DIAGNOSIS — O9989 Other specified diseases and conditions complicating pregnancy, childbirth and the puerperium: Secondary | ICD-10-CM

## 2019-01-23 DIAGNOSIS — Z3A28 28 weeks gestation of pregnancy: Secondary | ICD-10-CM

## 2019-01-23 DIAGNOSIS — O0990 Supervision of high risk pregnancy, unspecified, unspecified trimester: Secondary | ICD-10-CM

## 2019-01-23 NOTE — Progress Notes (Signed)
  Subjective  Fetal Movement? yes Contractions? no Leaking Fluid? no Vaginal Bleeding? no No seizures, taking Keppra Objective  BP 120/80   Wt 196 lb (88.9 kg)   LMP 07/26/2018 (Exact Date)   BMI 32.12 kg/m  General: NAD Pumonary: no increased work of breathing Abdomen: gravid, non-tender Extremities: no edema Psychiatric: mood appropriate, affect full  Assessment  23 y.o. G2P0010 at [redacted]w[redacted]d by  04/13/2019, by Ultrasound presenting for routine prenatal visit  Plan   Problem List Items Addressed This Visit    Supervision of high risk pregnancy, antepartum    PNV, [facility]   Seizure (HCC)    Keppra    Sees neurology   [redacted] weeks gestation of pregnancy      Labs today      Encounter for screening examination for impaired glucose regulation and diabetes mellitus         [facility]inic [facility]  Dating confirmed Blood type: O/Positive/-- (11/15 1452)   Genetic Screen Normal XY Antibody:Negative (11/15 1452)  Anatomic [place] [facility] Rubella: <0.90 (11/15 1452) Varicella: I   GTT  Third trimester:  RPR: Non Reactive (11/15 1452)   Rhogam na HBsAg: Negative (11/15 1452)   TDaP vaccine    Next appt      Flu Shot: HIV: Non Reactive (11/15 1452)   Baby Food                    undecided            GBS: pending  Contraception undecided Pap:too young  CBB  no   CS/VBAC na     Annamarie Major, MD, [facility], [facility] 01/23/2019  9:48 AM

## 2019-01-23 NOTE — Patient Instructions (Signed)
Third Trimester of Pregnancy The third trimester is from week 28 through week 40 (months 7 through 9). The third trimester is a time when the unborn baby (fetus) is growing rapidly. At the end of the ninth month, the fetus is about 20 inches in length and weighs 6-10 pounds. Body changes during your third trimester Your body will continue to go through many changes during pregnancy. The changes vary from woman to woman. During the third trimester:  Your weight will continue to increase. You can expect to gain 25-35 pounds (11-16 kg) by the end of the pregnancy.  You may begin to get stretch marks on your hips, abdomen, and breasts.  You may urinate more often because the fetus is moving lower into your pelvis and pressing on your bladder.  You may develop or continue to have heartburn. This is caused by increased hormones that slow down muscles in the digestive tract.  You may develop or continue to have constipation because increased hormones slow digestion and cause the muscles that push waste through your intestines to relax.  You may develop hemorrhoids. These are swollen veins (varicose veins) in the rectum that can itch or be painful.  You may develop swollen, bulging veins (varicose veins) in your legs.  You may have increased body aches in the pelvis, back, or thighs. This is due to weight gain and increased hormones that are relaxing your joints.  You may have changes in your hair. These can include thickening of your hair, rapid growth, and changes in texture. Some women also have hair loss during or after pregnancy, or hair that feels dry or thin. Your hair will most likely return to normal after your baby is born.  Your breasts will continue to grow and they will continue to become tender. A yellow fluid (colostrum) may leak from your breasts. This is the first milk you are producing for your baby.  Your belly button may stick out.  You may notice more swelling in your hands,  face, or ankles.  You may have increased tingling or numbness in your hands, arms, and legs. The skin on your belly may also feel numb.  You may feel short of breath because of your expanding uterus.  You may have more problems sleeping. This can be caused by the size of your belly, increased need to urinate, and an increase in your body's metabolism.  You may notice the fetus "dropping," or moving lower in your abdomen (lightening).  You may have increased vaginal discharge.  You may notice your joints feel loose and you may have pain around your pelvic bone. What to expect at prenatal visits You will have prenatal exams every 2 weeks until week 36. Then you will have weekly prenatal exams. During a routine prenatal visit:  You will be weighed to make sure you and the baby are growing normally.  Your blood pressure will be taken.  Your abdomen will be measured to track your baby's growth.  The fetal heartbeat will be listened to.  Any test results from the previous visit will be discussed.  You may have a cervical check near your due date to see if your cervix has softened or thinned (effaced).  You will be tested for Group B streptococcus. This happens between 35 and 37 weeks. Your health care provider may ask you:  What your birth plan is.  How you are feeling.  If you are feeling the baby move.  If you have had any abnormal   symptoms, such as leaking fluid, bleeding, severe headaches, or abdominal cramping.  If you are using any tobacco products, including cigarettes, chewing tobacco, and electronic cigarettes.  If you have any questions. Other tests or screenings that may be performed during your third trimester include:  Blood tests that check for low iron levels (anemia).  Fetal testing to check the health, activity level, and growth of the fetus. Testing is done if you have certain medical conditions or if there are problems during the pregnancy.  Nonstress test  (NST). This test checks the health of your baby to make sure there are no signs of problems, such as the baby not getting enough oxygen. During this test, a belt is placed around your belly. The baby is made to move, and its heart rate is monitored during movement. What is false labor? False labor is a condition in which you feel small, irregular tightenings of the muscles in the womb (contractions) that usually go away with rest, changing position, or drinking water. These are called Braxton Hicks contractions. Contractions may last for hours, days, or even weeks before true labor sets in. If contractions come at regular intervals, become more frequent, increase in intensity, or become painful, you should see your health care provider. What are the signs of labor?  Abdominal cramps.  Regular contractions that start at 10 minutes apart and become stronger and more frequent with time.  Contractions that start on the top of the uterus and spread down to the lower abdomen and back.  Increased pelvic pressure and dull back pain.  A watery or bloody mucus discharge that comes from the vagina.  Leaking of amniotic fluid. This is also known as your "water breaking." It could be a slow trickle or a gush. Let your health care provider know if it has a color or strange odor. If you have any of these signs, call your health care provider right away, even if it is before your due date. Follow these instructions at home: Medicines  Follow your health care provider's instructions regarding medicine use. Specific medicines may be either safe or unsafe to take during pregnancy.  Take a prenatal vitamin that contains at least 600 micrograms (mcg) of folic acid.  If you develop constipation, try taking a stool softener if your health care provider approves. Eating and drinking   Eat a balanced diet that includes fresh fruits and vegetables, whole grains, good sources of protein such as meat, eggs, or tofu,  and low-fat dairy. Your health care provider will help you determine the amount of weight gain that is right for you.  Avoid raw meat and uncooked cheese. These carry germs that can cause birth defects in the baby.  If you have low calcium intake from food, talk to your health care provider about whether you should take a daily calcium supplement.  Eat four or five small meals rather than three large meals a day.  Limit foods that are high in fat and processed sugars, such as fried and sweet foods.  To prevent constipation: ? Drink enough fluid to keep your urine clear or pale yellow. ? Eat foods that are high in fiber, such as fresh fruits and vegetables, whole grains, and beans. Activity  Exercise only as directed by your health care provider. Most women can continue their usual exercise routine during pregnancy. Try to exercise for 30 minutes at least 5 days a week. Stop exercising if you experience uterine contractions.  Avoid heavy lifting.  Do   not exercise in extreme heat or humidity, or at high altitudes.  Wear low-heel, comfortable shoes.  Practice good posture.  You may continue to have sex unless your health care provider tells you otherwise. Relieving pain and discomfort  Take frequent breaks and rest with your legs elevated if you have leg cramps or low back pain.  Take warm sitz baths to soothe any pain or discomfort caused by hemorrhoids. Use hemorrhoid cream if your health care provider approves.  Wear a good support bra to prevent discomfort from breast tenderness.  If you develop varicose veins: ? Wear support pantyhose or compression stockings as told by your healthcare provider. ? Elevate your feet for 15 minutes, 3-4 times a day. Prenatal care  Write down your questions. Take them to your prenatal visits.  Keep all your prenatal visits as told by your health care provider. This is important. Safety  Wear your seat belt at all times when driving.  Make  a list of emergency phone numbers, including numbers for family, friends, the hospital, and police and fire departments. General instructions  Avoid cat litter boxes and soil used by cats. These carry germs that can cause birth defects in the baby. If you have a cat, ask someone to clean the litter box for you.  Do not travel far distances unless it is absolutely necessary and only with the approval of your health care provider.  Do not use hot tubs, steam rooms, or saunas.  Do not drink alcohol.  Do not use any products that contain nicotine or tobacco, such as cigarettes and e-cigarettes. If you need help quitting, ask your health care provider.  Do not use any medicinal herbs or unprescribed drugs. These chemicals affect the formation and growth of the baby.  Do not douche or use tampons or scented sanitary pads.  Do not cross your legs for long periods of time.  To prepare for the arrival of your baby: ? Take prenatal classes to understand, practice, and ask questions about labor and delivery. ? Make a trial run to the hospital. ? Visit the hospital and tour the maternity area. ? Arrange for maternity or paternity leave through employers. ? Arrange for family and friends to take care of pets while you are in the hospital. ? Purchase a rear-facing car seat and make sure you know how to install it in your car. ? Pack your hospital bag. ? Prepare the baby's nursery. Make sure to remove all pillows and stuffed animals from the baby's crib to prevent suffocation.  Visit your dentist if you have not gone during your pregnancy. Use a soft toothbrush to brush your teeth and be gentle when you floss. Contact a health care provider if:  You are unsure if you are in labor or if your water has broken.  You become dizzy.  You have mild pelvic cramps, pelvic pressure, or nagging pain in your abdominal area.  You have lower back pain.  You have persistent nausea, vomiting, or  diarrhea.  You have an unusual or bad smelling vaginal discharge.  You have pain when you urinate. Get help right away if:  Your water breaks before 37 weeks.  You have regular contractions less than 5 minutes apart before 37 weeks.  You have a fever.  You are leaking fluid from your vagina.  You have spotting or bleeding from your vagina.  You have severe abdominal pain or cramping.  You have rapid weight loss or weight gain.  You have   shortness of breath with chest pain.  You notice sudden or extreme swelling of your face, hands, ankles, feet, or legs.  Your baby makes fewer than 10 movements in 2 hours.  You have severe headaches that do not go away when you take medicine.  You have vision changes. Summary  The third trimester is from week 28 through week 40, months 7 through 9. The third trimester is a time when the unborn baby (fetus) is growing rapidly.  During the third trimester, your discomfort may increase as you and your baby continue to gain weight. You may have abdominal, leg, and back pain, sleeping problems, and an increased need to urinate.  During the third trimester your breasts will keep growing and they will continue to become tender. A yellow fluid (colostrum) may leak from your breasts. This is the first milk you are producing for your baby.  False labor is a condition in which you feel small, irregular tightenings of the muscles in the womb (contractions) that eventually go away. These are called Braxton Hicks contractions. Contractions may last for hours, days, or even weeks before true labor sets in.  Signs of labor can include: abdominal cramps; regular contractions that start at 10 minutes apart and become stronger and more frequent with time; watery or bloody mucus discharge that comes from the vagina; increased pelvic pressure and dull back pain; and leaking of amniotic fluid. This information is not intended to replace advice given to you by your  health care provider. Make sure you discuss any questions you have with your health care provider. Document Released: 10/18/2001 Document Revised: 11/29/2016 Document Reviewed: 11/29/2016 Elsevier Interactive Patient Education  2019 Elsevier Inc.  

## 2019-01-24 LAB — 28 WEEK RH+PANEL
BASOS ABS: 0 10*3/uL (ref 0.0–0.2)
Basos: 0 %
EOS (ABSOLUTE): 0 10*3/uL (ref 0.0–0.4)
Eos: 0 %
GESTATIONAL DIABETES SCREEN: 72 mg/dL (ref 65–139)
HIV Screen 4th Generation wRfx: NONREACTIVE
Hematocrit: 36.3 % (ref 34.0–46.6)
Hemoglobin: 12.2 g/dL (ref 11.1–15.9)
Immature Grans (Abs): 0 10*3/uL (ref 0.0–0.1)
Immature Granulocytes: 0 %
LYMPHS ABS: 1.3 10*3/uL (ref 0.7–3.1)
Lymphs: 15 %
MCH: 31 pg (ref 26.6–33.0)
MCHC: 33.6 g/dL (ref 31.5–35.7)
MCV: 92 fL (ref 79–97)
MONOS ABS: 0.8 10*3/uL (ref 0.1–0.9)
Monocytes: 9 %
Neutrophils Absolute: 6.9 10*3/uL (ref 1.4–7.0)
Neutrophils: 76 %
PLATELETS: 240 10*3/uL (ref 150–450)
RBC: 3.94 x10E6/uL (ref 3.77–5.28)
RDW: 11.8 % (ref 11.7–15.4)
RPR: NONREACTIVE
WBC: 9.2 10*3/uL (ref 3.4–10.8)

## 2019-01-25 ENCOUNTER — Telehealth: Payer: Self-pay

## 2019-01-25 NOTE — Telephone Encounter (Signed)
Pt wants a note for work d/t Covid-19.  She works in [employer] and feels she should try to stay away form customers as much as possible d/t pregnancy.  [phone]

## 2019-01-26 ENCOUNTER — Other Ambulatory Visit: Payer: Self-pay | Admitting: Obstetrics & Gynecology

## 2019-01-28 NOTE — Telephone Encounter (Signed)
Pt returned call.  [facility] to let her know her letter is in MyChart.

## 2019-01-28 NOTE — Telephone Encounter (Signed)
Mailbox full

## 2019-02-06 ENCOUNTER — Ambulatory Visit (INDEPENDENT_AMBULATORY_CARE_PROVIDER_SITE_OTHER): Payer: Medicaid Other | Admitting: Maternal Newborn

## 2019-02-06 ENCOUNTER — Other Ambulatory Visit: Payer: Self-pay

## 2019-02-06 VITALS — BP 108/70 | Wt 201.0 lb

## 2019-02-06 DIAGNOSIS — O0990 Supervision of high risk pregnancy, unspecified, unspecified trimester: Secondary | ICD-10-CM

## 2019-02-06 DIAGNOSIS — O0993 Supervision of high risk pregnancy, unspecified, third trimester: Secondary | ICD-10-CM

## 2019-02-06 DIAGNOSIS — Z3A30 30 weeks gestation of pregnancy: Secondary | ICD-10-CM

## 2019-02-06 DIAGNOSIS — Z23 Encounter for immunization: Secondary | ICD-10-CM

## 2019-02-06 NOTE — Progress Notes (Signed)
ROB/TDap/BT consent form today- has questions about water birth

## 2019-02-06 NOTE — Patient Instructions (Signed)
 Hello,  Given the current COVID-19 pandemic, our practice is making changes in how we are providing care to our patients. We are limiting in-person visits for the safety of all of our patients.   As a practice, we have met to discuss the best way to minimize visits, but still provide excellent care to our expecting mothers.  We have decided on the following visit structure for low-risk pregnancies.  Initial Pregnancy visit will be conducted as a telephone or web visit.  Between 10-14 weeks  there will be one in-person visit for an ultrasound, lab work, and genetic screening. 20 weeks in-person visit with an anatomy ultrasound  28 weeks in-person office visit for a 1-hour glucose test and a TDAP vaccination 32 weeks in-person office visit 34 weeks telephone visit 36 weeks in-person office visit for GBS, chlamydia, and gonorrhea testing 38 weeks in-person office visit 40 weeks in-person office visit  Understandably, some patients will require more visits than what is outlined above. Additional visits will be determined on a case-by-case basis.   We will, as always, be available for emergencies or to address concerns that might arise between in-person visits. We ask that you allow us the opportunity to address any concerns over the phone or through a virtual visit first. We will be available to return your phone calls throughout the day.   If you are able to purchase a scale, a blood pressure machine, and a home fetal doppler visits could be limited further. This will help decrease your exposure risks, but these purchases are not a necessity.   Things seem to change daily and there is the possibility that this structure could change, please be patient as we adapt to a new way of caring for patients.   Thank you for trusting us with your prenatal care. Our practice values you and looks forward to providing you with excellent care.   Sincerely,   [facility], [facility]      COVID-19 and Your Pregnancy FAQ  How can I prevent infection with COVID-19 during my pregnancy? Social distancing is key. Please limit any interactions in public. Try and work from home if possible. Frequently wash your hands after touching possibly contaminated surfaces. Avoid touching your face.  Minimize trips to the store. Consider online ordering when possible.   Should I wear a mask? Masks should only be worn by those experiencing symptoms of COVID-19 or those with confirmed COVID-19 when they are in public or around other individuals.  What are the symptoms of COVID-19? Fever (greater than 100.4 F), dry cough, shortness of breath.  Am I more at risk for COVID-19 since I am pregnant? There is not currently data showing that pregnant women are more adversely impacted by COVID-19 than the general population. However, we know that pregnant women tend to have worse respiratory complications from similar diseases such as the flu and SARS and for this reason should be considered an at-risk population.  What do I do if I am experiencing the symptoms of COVID-19? Testing is being limited because of test availability. If you are experiencing symptoms you should quarantine yourself, and the members of your family, for at least 2 weeks at home.   Please visit this website for more information: https://www.cdc.gov/coronavirus/2019-ncov/if-you-are-sick/steps-when-sick.html  When should I go to the Emergency Room? Please go to the emergency room if you are experiencing ANY of these symptoms*:  1.    Difficulty breathing or shortness of breath 2.      Persistent pain or pressure in the chest 3.    Confusion or difficulty being aroused (or awakened) 4.    Bluish lips or face  *This list is not all inclusive. Please consult our office for any other symptoms that are severe or concerning.  What do I do if I am having difficulty breathing? You should go to the Emergency Room for  evaluation. At this time they have a tent set up for evaluating patients with COVID-19 symptoms.   How will my prenatal care be different because of the COVID-19 pandemic? It has been recommended to reduce the frequency of face-to-face visits and use resources such as telephone and virtual visits when possible. Using a scale, blood pressure machine and fetal doppler at home can further help reduce face-to-face visits. You will be provided with additional information on this topic.  We ask that you come to your visits alone to minimize potential exposures to  COVID-19.  How can I receive childbirth education? At this time in-person classes have been cancelled. You can register for online childbirth education, breastfeeding, and newborn care classes.  Please visit:  [email or web address] for more information  How will my hospital birth experience be different? The hospital is currently limiting visitors. This means that while you are in labor you can only have one person at the hospital with you. Additional family members will not be allowed to wait in the building or outside your room. Your one support person can be the father of the baby, a relative, a doula, or a friend. Once one support person is designated that person will wear a band. This band cannot be shared with multiple people.  How long will I stay in the hospital for after giving birth? It is also recommended that discharge home be expedited during the COVID-19 outbreak. This means staying for 1 day after a vaginal delivery and 2 days after a cesarean section.  What if I have COVID-19 and I am in labor? We ask that you wear a mask while on labor and delivery. We will try and accommodate you being placed in a room that is capable of filtering the air. Please call ahead if you are in labor and on your way to the hospital. The phone number for labor and delivery at [facility] is [phone].  If I have  COVID-19 when my baby is born how can I prevent my baby from contracting COVID-19? This is an issue that will have to be discussed on a case-by-case basis. Current recommendations suggest providing separate isolation rooms for both the mother and new infant as well as limiting visitors. However, there are practical challenges to this recommendation. The situation will assuredly change and decisions will be influenced by the desires of the mother and availability of space.  Some suggestions are the use of a curtain or physical barrier between mom and infant, hand hygiene, mom wearing a mask, or 6 feet of spacing between a mom and infant.   Can I breastfeed during the COVID-19 pandemic?   Yes, breastfeeding is encouraged.  Can I breastfeed if I have COVID-19? Yes. Covid-19 has not been found in breast milk. This means you cannot give COVID-19 to your child through breast milk. Breast feeding will also help pass antibodies to fight infection to your baby.   What precautions should I take when breastfeeding if I have COVID-19? If a mother and newborn do room-in and the mother wishes to feed at the breast, she should   put on a facemask and practice hand hygiene before each feeding.  What precautions should I take when pumping if I have COVID-19? Prior to expressing breast milk, mothers should practice hand hygiene. After each pumping session, all parts that come into contact with breast milk should be thoroughly washed and the entire pump should be appropriately disinfected per the manufacturer's instructions. This expressed breast milk should be fed to the newborn by a healthy caregiver.  What if I am pregnant and work in healthcare? Based on limited data regarding COVID-19 and pregnancy, ACOG currently does not propose creating additional restrictions on pregnant health care personnel because of COVID-19 alone. Pregnant women do not appear to be at higher risk of severe disease related to COVID-19.  Pregnant health care personnel should follow CDC risk assessment and infection control guidelines for health care personnel exposed to patients with suspected or confirmed COVID-19. Adherence to recommended infection prevention and control practices is an important part of protecting all health care personnel in health care settings.    Information on COVID-19 in pregnancy is very limited; however, facilities may want to consider limiting exposure of pregnant health care personnel to patients with confirmed or suspected COVID-19 infection, especially during higher-risk procedures (eg, aerosol-generating procedures), if feasible, based on staffing availability.    

## 2019-02-06 NOTE — Progress Notes (Signed)
Routine Prenatal Care Visit  Subjective  Heather [name] is a 23 y.o. G2P0010 at [redacted]w[redacted]d being seen today for ongoing prenatal care.  She is currently monitored for the following issues for this high-risk pregnancy and has Family history of breast cancer in female; Acne; Supervision of high risk pregnancy, antepartum; and Seizure (HCC) on their problem list.  ----------------------------------------------------------------------------------- Patient reports no complaints.   Contractions: Not present. Vag. Bleeding: None.  Movement: Present. No leaking of fluid.  ----------------------------------------------------------------------------------- The following portions of the patient's history were reviewed and updated as appropriate: allergies, current medications, past family history, past medical history, past social history, past surgical history and problem list. Problem list updated.  Objective  Blood pressure 108/70, weight 201 lb (91.2 kg), last menstrual period 07/26/2018. Pregravid weight 140 lb (63.5 kg) Total Weight Gain 61 lb (27.7 kg) Body mass index is 32.94 kg/m.   Could not provide urine sample today.  Fetal Status: Fetal Heart Rate (bpm): 144 Fundal Height: 31 cm Movement: Present     General:  Alert, oriented and cooperative. Patient is in no acute distress.  Skin: Skin is warm and dry. No rash noted.   Cardiovascular: Normal heart rate noted  Respiratory: Normal respiratory effort, no problems with respiration noted  Abdomen: Soft, gravid, appropriate for gestational age. Pain/Pressure: Absent     Pelvic:  Cervical exam deferred        Extremities: Normal range of motion.  Edema: None  Mental Status: Normal mood and affect. Normal behavior. Normal judgment and thought content.    Assessment   22 y.o. G2P0010 at 110w4d, EDD 04/13/2019 by Ultrasound presenting for a routine prenatal visit.  Plan   SECOND Problems (from 09/21/18 to present)    Problem Noted  Resolved   Supervision of high risk pregnancy, antepartum 09/23/2018 by [name], CNM No   Overview Addendum 01/23/2019  9:51 AM by Nadara Mustard, MD    Clinic [facility] Prenatal Labs  Dating confirmed Blood type: O/Positive/-- (11/15 1452)   Genetic Screen Normal XY Antibody:Negative (11/15 1452)  Anatomic [place] [facility] Rubella: <0.90 (11/15 1452) Varicella:I    GTT  Third trimester:  RPR: Non Reactive (11/15 1452)   Rhogam na HBsAg: Negative (11/15 1452)   TDaP vaccine                       Flu Shot: HIV: Non Reactive (11/15 1452)   Baby Food                                GBS:   Contraception  Pap:too young  CBB     CS/VBAC na   Support Person               Discussed spacing of visits due to COVID-19 precautions, encouraged to stay in touch via MyChart/telephone for any concerns or questions.  She inquired about waterbirth. We discussed that [facility] does not have any providers currently who can provide waterbirth attendance and that waterbirths are suspended for now at [facility] due to infection control restrictions.  She is concerned about labor with history of seizures, and we talked about the fact that she can change her decision to labor without an epidural throughout the process.  TDaP accepted today.  Please refer to After Visit Summary for other counseling recommendations.   Return in about 4 weeks (around 03/06/2019) for ROB telephone visit.  [name], CNM 02/06/2019

## 2019-02-22 ENCOUNTER — Telehealth: Payer: Self-pay

## 2019-02-22 ENCOUNTER — Ambulatory Visit (INDEPENDENT_AMBULATORY_CARE_PROVIDER_SITE_OTHER): Payer: Medicaid Other | Admitting: Obstetrics and Gynecology

## 2019-02-22 ENCOUNTER — Encounter: Payer: Self-pay | Admitting: Obstetrics and Gynecology

## 2019-02-22 ENCOUNTER — Other Ambulatory Visit: Payer: Self-pay

## 2019-02-22 VITALS — BP 110/70 | Wt 209.0 lb

## 2019-02-22 DIAGNOSIS — Z3A32 32 weeks gestation of pregnancy: Secondary | ICD-10-CM | POA: Diagnosis not present

## 2019-02-22 DIAGNOSIS — O368130 Decreased fetal movements, third trimester, not applicable or unspecified: Secondary | ICD-10-CM | POA: Diagnosis not present

## 2019-02-22 DIAGNOSIS — O0990 Supervision of high risk pregnancy, unspecified, unspecified trimester: Secondary | ICD-10-CM

## 2019-02-22 NOTE — Progress Notes (Signed)
Routine Prenatal Care Visit  Subjective  Heather [name] is a 23 y.o. G2P0010 at [redacted]w[redacted]d being seen today for ongoing prenatal care.  She is currently monitored for the following issues for this low-risk pregnancy and has Family history of breast cancer in female; Acne; Supervision of high risk pregnancy, antepartum; and Seizure (HCC) on their problem list.  ----------------------------------------------------------------------------------- Patient reports no complaints.   Contractions: Irritability. Vag. Bleeding: None.  Movement: (!) Decreased. Denies leaking of fluid.  ----------------------------------------------------------------------------------- The following portions of the patient's history were reviewed and updated as appropriate: allergies, current medications, past family history, past medical history, past social history, past surgical history and problem list. Problem list updated.   Objective  Blood pressure 110/70, weight 209 lb (94.8 kg), last menstrual period 07/26/2018. Pregravid weight 140 lb (63.5 kg) Total Weight Gain 69 lb (31.3 kg) Urinalysis: Urine Protein    Urine Glucose    Fetal Status: Fetal Heart Rate (bpm): 145   Movement: (!) Decreased     General:  Alert, oriented and cooperative. Patient is in no acute distress.  Skin: Skin is warm and dry. No rash noted.   Cardiovascular: Normal heart rate noted  Respiratory: Normal respiratory effort, no problems with respiration noted  Abdomen: Soft, gravid, appropriate for gestational age. Pain/Pressure: Absent     Pelvic:  Cervical exam deferred        Extremities: Normal range of motion.  Edema: None  Mental Status: Normal mood and affect. Normal behavior. Normal judgment and thought content.   NST: Baseline FHR: 145 beats/min Variability: moderate Accelerations: present Decelerations: absent Tocometry: not done  Interpretation:  INDICATIONS: decreased fetal movement RESULTS:  A NST procedure was  performed with FHR monitoring and a normal baseline established, appropriate time of 20-40 minutes of evaluation, and accels >2 seen w 15x15 characteristics.  Results show a REACTIVE NST.    Assessment   22 y.o. G2P0010 at [redacted]w[redacted]d by  04/13/2019, by Ultrasound presenting for work-in prenatal visit  Plan   SECOND Problems (from 09/21/18 to present)    Problem Noted Resolved   Supervision of high risk pregnancy, antepartum 09/23/2018 by [name], CNM No   Overview Addendum 02/06/2019 10:02 AM by [name], CNM    [facility]inic [facility] Prenatal Labs  Dating confirmed Blood type: O/Positive/-- (11/15 1452)   Genetic Screen Normal XY Antibody:Negative (11/15 1452)  Anatomic [place] [facility] Rubella: <0.90 (11/15 1452) Varicella:I    GTT  Third trimester: 72 RPR: Non Reactive (11/15 1452)   Rhogam na HBsAg: Negative (11/15 1452)   TDaP vaccine  02/06/2019               Flu Shot: HIV: Non Reactive (11/15 1452)   Baby Food                                GBS:   Contraception  Pap:too young  CBB     CS/VBAC na   Support Person                  Preterm labor symptoms and general obstetric precautions including but not limited to vaginal bleeding, contractions, leaking of fluid and fetal movement were reviewed in detail with the patient. Please refer to After Visit Summary for other counseling recommendations.   Return in 12 days (on 03/06/2019) for Keep previously scheduled appt.  Thomasene Mohair, MD, [facility], [facility] 02/22/2019 12:41 PM

## 2019-02-22 NOTE — Telephone Encounter (Signed)
Pt is 7 1/2 m preg; c/o decreased fetal movement; hard to get him to move.  [phone]  Pt has eaten, drank a soda, did jumping jacks, lied down in diff positions trying to get baby to move.  Appt made 11:30 c SDJ.

## 2019-03-06 ENCOUNTER — Other Ambulatory Visit: Payer: Self-pay

## 2019-03-06 ENCOUNTER — Ambulatory Visit (INDEPENDENT_AMBULATORY_CARE_PROVIDER_SITE_OTHER): Payer: Medicaid Other | Admitting: Certified Nurse Midwife

## 2019-03-06 DIAGNOSIS — Z3A34 34 weeks gestation of pregnancy: Secondary | ICD-10-CM

## 2019-03-06 DIAGNOSIS — R569 Unspecified convulsions: Secondary | ICD-10-CM

## 2019-03-06 DIAGNOSIS — O9989 Other specified diseases and conditions complicating pregnancy, childbirth and the puerperium: Secondary | ICD-10-CM

## 2019-03-06 DIAGNOSIS — O0990 Supervision of high risk pregnancy, unspecified, unspecified trimester: Secondary | ICD-10-CM

## 2019-03-06 NOTE — Progress Notes (Signed)
ROB- pt would like to know is there any way she can find out how much baby weighs (shes curious, no concerns)

## 2019-03-06 NOTE — Progress Notes (Signed)
Routine Prenatal Care Visit- Virtual Visit  Subjective   Virtual Visit via Telephone Note  I connected with@ on 03/06/19 at  9:50 AM EDT by telephone and verified that I am speaking with the correct person using two identifiers.   I discussed the limitations, risks, security and privacy concerns of performing an evaluation and management service by telephone and the availability of in person appointments. I also discussed with the patient that there may be a patient responsible charge related to this service. The patient expressed understanding and agreed to proceed.  The patient was at home I spoke with the patient from my  workstation The names of people involved in this encounter were: Tracey Harries , and [place], CMA , and myself .   Heather [name] is a 23 y.o. G2P0010 at [redacted]w[redacted]d being seen today for ongoing prenatal care.  She is currently monitored for the following issues for this high-risk pregnancy and has Family history of breast cancer in female; Acne; Supervision of high risk pregnancy, antepartum; and Seizure (HCC) on their problem list.  ----------------------------------------------------------------------------------- Patient reports good fetal movement  Denies leaking of fluid, regular contractions, or bleeding. Continues on Keppra Questions regarding pain relief in labor and what happens if she has a seizure in labor Wants to breast feed ----------------------------------------------------------------------------------- The following portions of the patient's history were reviewed and updated as appropriate: allergies, current medications, past family history, past medical history, past social history, past surgical history and problem list. Problem list updated.   Objective  Last menstrual period 07/26/2018. Pregravid weight 140 lb (63.5 kg) Total Weight Gain 69 lb (31.3 kg)  Physical Exam could not be performed. Because of the COVID-19 outbreak this visit was  performed over the phone and not in person.   Assessment   22 y.o. G2P0010 at [redacted]w[redacted]d by  04/13/2019, by Ultrasound presenting for routine prenatal visit   Plan   SECOND Problems (from 09/21/18 to present)    Problem Noted Resolved   Supervision of high risk pregnancy, antepartum 09/23/2018 by [name], CNM No   Overview Addendum 02/06/2019 10:02 AM by [name], CNM    Clinic [facility] Prenatal Labs  Dating confirmed Blood type: O/Positive/-- (11/15 1452)   Genetic Screen Normal XY Antibody:Negative (11/15 1452)  Anatomic [place] [facility] Rubella: <0.90 (11/15 1452) Varicella:I    GTT  Third trimester: 72 RPR: Non Reactive (11/15 1452)   Rhogam na HBsAg: Negative (11/15 1452)   TDaP vaccine  02/06/2019               Flu Shot: HIV: Non Reactive (11/15 1452)   Baby Food       Breast                         GBS:   Contraception  Pap:too young  CBB     CS/VBAC na   Support Person                  Gestational age appropriate obstetric precautions including but not limited to vaginal bleeding, contractions, leaking of fluid and fetal movement were reviewed in detail with the patient.  Discussed symptoms and signs of labor and answered questions regarding labor    Follow Up Instructions: ROB in 1-2 weeks for cultures   I discussed the assessment and treatment plan with the patient. The patient was provided an opportunity to ask questions and all were answered. The patient agreed with the plan  and demonstrated an understanding of the instructions.     I provided 15 minutes of non-face-to-face time during this encounter.   Farrel Connersolleen Jaxtyn Linville, CNM [facility], [facility] 03/06/2019

## 2019-03-12 ENCOUNTER — Observation Stay
Admission: EM | Admit: 2019-03-12 | Discharge: 2019-03-12 | Disposition: A | Discharge status: Home / Self Care | Payer: Medicaid Other | Attending: Obstetrics & Gynecology | Admitting: Obstetrics & Gynecology

## 2019-03-12 ENCOUNTER — Other Ambulatory Visit: Payer: Self-pay

## 2019-03-12 DIAGNOSIS — Z3A35 35 weeks gestation of pregnancy: Secondary | ICD-10-CM | POA: Insufficient documentation

## 2019-03-12 DIAGNOSIS — Z79899 Other long term (current) drug therapy: Secondary | ICD-10-CM | POA: Insufficient documentation

## 2019-03-12 DIAGNOSIS — O4703 False labor before 37 completed weeks of gestation, third trimester: Principal | ICD-10-CM | POA: Insufficient documentation

## 2019-03-12 DIAGNOSIS — O479 False labor, unspecified: Secondary | ICD-10-CM | POA: Diagnosis present

## 2019-03-12 DIAGNOSIS — O4700 False labor before 37 completed weeks of gestation, unspecified trimester: Secondary | ICD-10-CM | POA: Diagnosis present

## 2019-03-12 MED ORDER — ACETAMINOPHEN 325 MG PO TABS: 650.0000 mg | ORAL_TABLET | ORAL | Status: DC | PRN

## 2019-03-12 MED ORDER — ONDANSETRON HCL 4 MG/2ML IJ SOLN: 4.0000 mg | Freq: Four times a day (QID) | INTRAMUSCULAR | Status: DC | PRN

## 2019-03-12 NOTE — Discharge Instructions (Signed)
Braxton Hicks Contractions Contractions of the uterus can occur throughout pregnancy, but they are not always a sign that you are in labor. You may have practice contractions called Braxton Hicks contractions. These false labor contractions are sometimes confused with true labor. What are Braxton Hicks contractions? Braxton Hicks contractions are tightening movements that occur in the muscles of the uterus before labor. Unlike true labor contractions, these contractions do not result in opening (dilation) and thinning of the cervix. Toward the end of pregnancy (32-34 weeks), Braxton Hicks contractions can happen more often and may become stronger. These contractions are sometimes difficult to tell apart from true labor because they can be very uncomfortable. You should not feel embarrassed if you go to the hospital with false labor. Sometimes, the only way to tell if you are in true labor is for your health care provider to look for changes in the cervix. The health care provider will do a physical exam and may monitor your contractions. If you are not in true labor, the exam should show that your cervix is not dilating and your water has not broken. If there are no other health problems associated with your pregnancy, it is completely safe for you to be sent home with false labor. You may continue to have Braxton Hicks contractions until you go into true labor. How to tell the difference between true labor and false labor True labor  Contractions last 30-70 seconds.  Contractions become very regular.  Discomfort is usually felt in the top of the uterus, and it spreads to the lower abdomen and low back.  Contractions do not go away with walking.  Contractions usually become more intense and increase in frequency.  The cervix dilates and gets thinner. False labor  Contractions are usually shorter and not as strong as true labor contractions.  Contractions are usually irregular.  Contractions  are often felt in the front of the lower abdomen and in the groin.  Contractions may go away when you walk around or change positions while lying down.  Contractions get weaker and are shorter-lasting as time goes on.  The cervix usually does not dilate or become thin. Follow these instructions at home:   Take over-the-counter and prescription medicines only as told by your health care provider.  Keep up with your usual exercises and follow other instructions from your health care provider.  Eat and drink lightly if you think you are going into labor.  If Braxton Hicks contractions are making you uncomfortable: ? Change your position from lying down or resting to walking, or change from walking to resting. ? Sit and rest in a tub of warm water. ? Drink enough fluid to keep your urine pale yellow. Dehydration may cause these contractions. ? Do slow and deep breathing several times an hour.  Keep all follow-up prenatal visits as told by your health care provider. This is important. Contact a health care provider if:  You have a fever.  You have continuous pain in your abdomen. Get help right away if:  Your contractions become stronger, more regular, and closer together.  You have fluid leaking or gushing from your vagina.  You pass blood-tinged mucus (bloody show).  You have bleeding from your vagina.  You have low back pain that you never had before.  You feel your baby's head pushing down and causing pelvic pressure.  Your baby is not moving inside you as much as it used to. Summary  Contractions that occur before labor are   called Braxton Hicks contractions, false labor, or practice contractions.  Braxton Hicks contractions are usually shorter, weaker, farther apart, and less regular than true labor contractions. True labor contractions usually become progressively stronger and regular, and they become more frequent.  Manage discomfort from Braxton Hicks contractions  by changing position, resting in a warm bath, drinking plenty of water, or practicing deep breathing. This information is not intended to replace advice given to you by your health care provider. Make sure you discuss any questions you have with your health care provider. Document Released: 03/09/2017 Document Revised: 08/08/2017 Document Reviewed: 03/09/2017 Elsevier Interactive Patient Education  2019 [employer]vier Inc.  

## 2019-03-12 NOTE — OB Triage Note (Signed)
Patient came in for observation for lower abdominal pain that was gotten worse the past 2 days. Pateint reports pain 7/10. Patient denies leaking of fluid but repots pink tinged vaginal spotting for the last two days. Patient reports + FM upon arrival, vaginal bleeding and spotting. Vital signs stable and patient afebrile. FHR baseline 145 with moderate variability with accelerations 15 x 15 and no decelerations. Family at bedside. Will continue to monitor.

## 2019-03-12 NOTE — Discharge Summary (Signed)
  See FPN 

## 2019-03-12 NOTE — Final Progress Note (Signed)
Physician Final Progress Note  Patient ID: Heather [name] MRN: [id number] DOB/AGE: [birth year] 23 y.o.  Admit date: 03/12/2019 Admitting provider: Nadara Mustardobert P Zane Samson, MD Discharge date: 03/12/2019  Admission Diagnoses: Back pain, Contraction pain, 35 weeks pregnancy  Discharge Diagnoses:  Active Problems:   Preterm contractions   Back pain  Consults: None  Significant Findings/ Diagnostic Studies:  Obstetrics Admission History & Physical   Vaginal Bleeding and Contractions   HPI:  23 y.o. G2P0010 @ 6926w3d (04/13/2019, by Ultrasound). Admitted on 03/12/2019:   Patient Active Problem List   Diagnosis Date Noted  . Preterm contractions 03/12/2019  . Supervision of high risk pregnancy, antepartum 09/23/2018  . Seizure (HCC) 02/28/2018  . Acne 12/06/2015  . Family history of breast cancer in female 11/30/2015     Presents for worsening low back pain and lower abdominal crampiug c/w contractions for 2 days also noted pink spotting 2 days ago briefly.   No recent trauma, sex, injury infection.  No recent seizure; takes Keppra without side effect.  [facility]. Pregnancy complicated by none.  ROS: A review of systems was performed and negative, except as stated in the above HPI. PMHx:  Past Medical History:  Diagnosis Date  . Seizures (HCC)    last at age 23; stopped taking AED's in Spring 2014.   PSHx: History reviewed. No pertinent surgical history. Medications:  Medications Prior to Admission  Medication Sig Dispense Refill Last Dose  . levETIRAcetam (KEPPRA) 500 MG tablet Take 1 tablet (500 mg total) by mouth 2 (two) times daily. 60 tablet 6 03/12/2019  . triamcinolone cream (KENALOG) 0.1 % APPLY TO AFFECTED AREA TWICE DAILY FOR UP TO 2 WEEKS   Unknown at Unknown time   Allergies: has No Known Allergies. OBHx:  OB History  Gravida Para Term Preterm AB Living  2 0 0 0 1 0  SAB TAB Ectopic Multiple Live Births  1 0 0 0      # Outcome Date GA Lbr Len/2nd  Weight Sex Delivery Anes PTL Lv  2 Current           1 SAB            WUJ:WJXBJYNW/[id number]:Negative/unremarkable except as detailed in HPI.Marland Kitchen.  No family history of birth defects. Soc Hx: Alcohol: none and Recreational drug use: none  Objective:   Vitals:   03/12/19 2202  BP: 131/78  Pulse: (!) 115  Resp: 16  Temp: 98 F (36.7 C)   Constitutional: Well nourished, well developed female in no acute distress.  HEENT: normal Skin: Warm and dry.  Cardiovascular:Regular rate and rhythm.   Extremity: trace to 1+ bilateral pedal edema Respiratory: Clear to auscultation bilateral. Normal respiratory effort Abdomen: gravid, ND, FHT present, mild tenderness on exam Back: no CVAT Neuro: DTRs 2+, Cranial nerves grossly intact Psych: Alert and Oriented x3. No memory deficits. Normal mood and affect.  MS: normal gait, normal bilateral lower extremity ROM/strength/stability.  Pelvic exam: is not limited by body habitus EGBUS: within normal limits Vagina: within normal limits and with normal mucosa, no blood Cervix: CERVIX: 0 cm dilated, 0 effaced, -3 station Uterus: No contractions observed for 20 minutes.  Adnexa: not evaluated  EFM:FHR: 140 bpm, variability: moderate,  accelerations:  Present,  decelerations:  Absent Toco: None   Perinatal info:  Blood type: O positive Rubella- Not immune Varicella -Immune TDaP tetanus status unknown to the patient RPR NR / HIV Neg/ HBsAg Neg   Results for orders placed or  performed during the hospital encounter of 03/12/19  Urinalysis, Complete w Microscopic  Result Value Ref Range   Color, Urine STRAW (A) YELLOW   APPearance HAZY (A) CLEAR   Specific Gravity, Urine 1.008 1.005 - 1.030   pH 6.0 5.0 - 8.0   Glucose, UA NEGATIVE NEGATIVE mg/dL   Hgb urine dipstick NEGATIVE NEGATIVE   Bilirubin Urine NEGATIVE NEGATIVE   Ketones, ur NEGATIVE NEGATIVE mg/dL   Protein, ur NEGATIVE NEGATIVE mg/dL   Nitrite NEGATIVE NEGATIVE   Leukocytes,Ua NEGATIVE NEGATIVE     Assessment & Plan:   23 y.o. G2P0010 @ [redacted]w[redacted]d, Admitted on 03/12/2019: Low back pain and Braxton Hicks UA, treat as necessary No s/sx PTL Outpatient management planned    Procedures: A NST procedure was performed with FHR monitoring and a normal baseline established, appropriate time of 20-40 minutes of evaluation, and accels >2 seen w 15x15 characteristics.  Results show a REACTIVE NST.   Discharge Condition: good  Disposition: Discharge disposition: 01-Home or Self Care     Home  Diet: Regular diet  Discharge Activity: Activity as tolerated  Discharge Instructions    Call MD for:   Complete by:  As directed    Worsening contractions or pain; leakage of fluid; bleeding.   Diet general   Complete by:  As directed    Increase activity slowly   Complete by:  As directed      Allergies as of 03/12/2019   No Known Allergies     Medication List    TAKE these medications   levETIRAcetam 500 MG tablet Commonly known as:  Keppra Take 1 tablet (500 mg total) by mouth 2 (two) times daily.   triamcinolone cream 0.1 % Commonly known as:  KENALOG APPLY TO AFFECTED AREA TWICE DAILY FOR UP TO 2 WEEKS      Follow-up Information    Nadara Mustard, MD. Go in 1 week(s).   Specialty:  Obstetrics and Gynecology Contact information: [address] [place] [address] [phone]           Total time spent taking care of this patient: 20 minutes  Signed: Letitia Libra 03/12/2019, 11:09 PM

## 2019-03-13 LAB — URINALYSIS, COMPLETE (UACMP) WITH MICROSCOPIC
Bilirubin Urine: NEGATIVE
Glucose, UA: NEGATIVE mg/dL
Hgb urine dipstick: NEGATIVE
Ketones, ur: NEGATIVE mg/dL
Leukocytes,Ua: NEGATIVE
Nitrite: NEGATIVE
Protein, ur: NEGATIVE mg/dL
RBC / HPF: NONE SEEN RBC/hpf (ref 0–5)
Specific Gravity, Urine: 1.008 (ref 1.005–1.030)
pH: 6 (ref 5.0–8.0)

## 2019-03-20 ENCOUNTER — Encounter: Payer: Self-pay | Admitting: Obstetrics & Gynecology

## 2019-03-20 ENCOUNTER — Other Ambulatory Visit (HOSPITAL_COMMUNITY)
Admission: RE | Admit: 2019-03-20 | Discharge: 2019-03-20 | Disposition: A | Discharge status: Home / Self Care | Payer: Medicaid Other | Source: Ambulatory Visit | Attending: Obstetrics & Gynecology | Admitting: Obstetrics & Gynecology

## 2019-03-20 ENCOUNTER — Ambulatory Visit (INDEPENDENT_AMBULATORY_CARE_PROVIDER_SITE_OTHER): Payer: Medicaid Other | Admitting: Obstetrics & Gynecology

## 2019-03-20 ENCOUNTER — Other Ambulatory Visit: Payer: Self-pay

## 2019-03-20 VITALS — BP 110/60 | Wt 221.0 lb

## 2019-03-20 DIAGNOSIS — Z3A36 36 weeks gestation of pregnancy: Secondary | ICD-10-CM

## 2019-03-20 DIAGNOSIS — Z113 Encounter for screening for infections with a predominantly sexual mode of transmission: Secondary | ICD-10-CM

## 2019-03-20 DIAGNOSIS — O0990 Supervision of high risk pregnancy, unspecified, unspecified trimester: Secondary | ICD-10-CM

## 2019-03-20 DIAGNOSIS — Z3685 Encounter for antenatal screening for Streptococcus B: Secondary | ICD-10-CM

## 2019-03-20 DIAGNOSIS — R569 Unspecified convulsions: Secondary | ICD-10-CM

## 2019-03-20 DIAGNOSIS — O9989 Other specified diseases and conditions complicating pregnancy, childbirth and the puerperium: Secondary | ICD-10-CM

## 2019-03-20 LAB — OB RESULTS CONSOLE GC/CHLAMYDIA: Gonorrhea: NEGATIVE

## 2019-03-20 LAB — OB RESULTS CONSOLE GBS: GBS: NEGATIVE

## 2019-03-20 NOTE — Progress Notes (Signed)
  Subjective  Fetal Movement? yes Contractions? no Leaking Fluid? no Vaginal Bleeding? no  Objective  BP 110/60   Wt 221 lb (100.2 kg)   LMP 07/26/2018 (Exact Date)   BMI 35.67 kg/m  General: NAD Pumonary: no increased work of breathing Abdomen: gravid, non-tender Extremities: no edema Psychiatric: mood appropriate, affect full  Assessment  23 y.o. G2P0010 at [redacted]w[redacted]d by  04/13/2019, by Ultrasound presenting for routine prenatal visit  Plan   Problem List Items Addressed This Visit      Other   Supervision of high risk pregnancy, antepartum   Seizure (HCC)    Other Visit Diagnoses    [redacted] weeks gestation of pregnancy    -  Primary   Antenatal screening for streptococcus B       Relevant Orders   Culture, beta strep (group b only)   Screen for STD (sexually transmitted disease)       Relevant Orders   Cervicovaginal ancillary only      SECOND Problems (from 09/21/18 to present)    Problem Noted Resolved   Supervision of high risk pregnancy, antepartum 09/23/2018 by [name], CNM No   Overview Addendum 02/06/2019 10:02 AM by [name], CNM    Clinic [facility] Prenatal Labs  Dating confirmed Blood type: O/Positive/-- (11/15 1452)   Genetic Screen Normal XY Antibody:Negative (11/15 1452)  Anatomic [place] [facility] Rubella: <0.90 (11/15 1452) Varicella:I    GTT  Third trimester: 72 RPR: Non Reactive (11/15 1452)   Rhogam na HBsAg: Negative (11/15 1452)   TDaP vaccine  02/06/2019               Flu Shot: HIV: Non Reactive (11/15 1452)   Baby Food Breast                               GBS:p   Contraception Condoms Pap:too young  CBB  No   CS/VBAC na   Support Person                Keppra, no recent seizure activity  Annamarie Major, MD, [facility], [facility] 03/20/2019  11:09 AM

## 2019-03-20 NOTE — Patient Instructions (Signed)
Group B Streptococcus Infection During Pregnancy  Group B Streptococcus (GBS) is a type of bacteria (Streptococcus agalactiae) that is often found in healthy people, commonly in the rectum, vagina, and intestines. In people who are healthy and not pregnant, the bacteria rarely cause serious illness or complications. However, women who test positive for GBS during pregnancy can pass the bacteria to their baby during childbirth, which can cause serious infection in the baby after birth. Women with GBS may also have infections during their pregnancy or immediately after childbirth, such as such as urinary tract infections (UTIs) or infections of the uterus (uterine infections). Having GBS also increases a woman's risk of complications during pregnancy, such as early (preterm) labor or delivery, miscarriage, or stillbirth. Routine testing (screening) for GBS is recommended for all pregnant women. What increases the risk? You may have a higher risk for GBS infection during pregnancy if you had one during a past pregnancy. What are the signs or symptoms? In most cases, GBS infection does not cause symptoms in pregnant women. Signs and symptoms of a possible GBS-related infection may include:  Labor starting before the 37th week of pregnancy.  A UTI or bladder infection, which may cause: ? Fever. ? Pain or burning during urination. ? Frequent urination.  Fever during labor, along with: ? Bad-smelling discharge. ? Uterine tenderness. ? Rapid heartbeat in the mother, baby, or both. Rare but serious symptoms of a possible GBS-related infection in women include:  Blood infection (septicemia). This may cause fever, chills, or confusion.  Lung infection (pneumonia). This may cause fever, chills, cough, rapid breathing, difficulty breathing, or chest pain.  Bone, joint, skin, or soft tissue infection. How is this diagnosed? You may be screened for GBS between week 35 and week 37 of your pregnancy. If  you have symptoms of preterm labor, you may be screened earlier. This condition is diagnosed based on lab test results from:  A swab of fluid from the vagina and rectum.  A urine sample. How is this treated? This condition is treated with antibiotic medicine. When you go into labor, or as soon as your water breaks (your membranes rupture), you will be given antibiotics through an IV tube. Antibiotics will continue until after you give birth. If you are having a cesarean delivery, you do not need antibiotics unless your membranes have already ruptured. Follow these instructions at home:  Take over-the-counter and prescription medicines only as told by your health care provider.  Take your antibiotic medicine as told by your health care provider. Do not stop taking the antibiotic even if you start to feel better.  Keep all pre-birth (prenatal) visits and follow-up visits as told by your health care provider. This is important. Contact a health care provider if:  You have pain or burning when you urinate.  You have to urinate frequently.  You have a fever or chills.  You develop a bad-smelling vaginal discharge. Get help right away if:  Your membranes rupture.  You go into labor.  You have severe pain in your abdomen.  You have difficulty breathing.  You have chest pain. This information is not intended to replace advice given to you by your health care provider. Make sure you discuss any questions you have with your health care provider. Document Released: 01/31/2008 Document Revised: 05/20/2016 Document Reviewed: 05/19/2016 Elsevier Interactive Patient Education  2019 Elsevier Inc.  

## 2019-03-21 LAB — CERVICOVAGINAL ANCILLARY ONLY
Chlamydia: NEGATIVE
Neisseria Gonorrhea: NEGATIVE

## 2019-03-24 LAB — CULTURE, BETA STREP (GROUP B ONLY): Strep Gp B Culture: NEGATIVE

## 2019-03-28 ENCOUNTER — Encounter: Payer: Self-pay | Admitting: Maternal Newborn

## 2019-03-28 ENCOUNTER — Other Ambulatory Visit: Payer: Self-pay

## 2019-03-28 ENCOUNTER — Ambulatory Visit (INDEPENDENT_AMBULATORY_CARE_PROVIDER_SITE_OTHER): Payer: Medicaid Other | Admitting: Maternal Newborn

## 2019-03-28 ENCOUNTER — Inpatient Hospital Stay
Admission: EM | Admit: 2019-03-28 | Discharge: 2019-04-01 | DRG: 806 | Disposition: A | Discharge status: Home / Self Care | Payer: Medicaid Other | Attending: Obstetrics & Gynecology | Admitting: Obstetrics & Gynecology

## 2019-03-28 DIAGNOSIS — O368130 Decreased fetal movements, third trimester, not applicable or unspecified: Secondary | ICD-10-CM

## 2019-03-28 DIAGNOSIS — Z3A37 37 weeks gestation of pregnancy: Secondary | ICD-10-CM

## 2019-03-28 DIAGNOSIS — O0990 Supervision of high risk pregnancy, unspecified, unspecified trimester: Secondary | ICD-10-CM

## 2019-03-28 DIAGNOSIS — O99354 Diseases of the nervous system complicating childbirth: Secondary | ICD-10-CM | POA: Diagnosis present

## 2019-03-28 DIAGNOSIS — O134 Gestational [pregnancy-induced] hypertension without significant proteinuria, complicating childbirth: Secondary | ICD-10-CM | POA: Diagnosis not present

## 2019-03-28 DIAGNOSIS — Z87891 Personal history of nicotine dependence: Secondary | ICD-10-CM

## 2019-03-28 DIAGNOSIS — O133 Gestational [pregnancy-induced] hypertension without significant proteinuria, third trimester: Secondary | ICD-10-CM

## 2019-03-28 DIAGNOSIS — Z3490 Encounter for supervision of normal pregnancy, unspecified, unspecified trimester: Secondary | ICD-10-CM | POA: Diagnosis present

## 2019-03-28 DIAGNOSIS — Z1159 Encounter for screening for other viral diseases: Secondary | ICD-10-CM

## 2019-03-28 DIAGNOSIS — G40909 Epilepsy, unspecified, not intractable, without status epilepticus: Secondary | ICD-10-CM | POA: Diagnosis present

## 2019-03-28 DIAGNOSIS — O368190 Decreased fetal movements, unspecified trimester, not applicable or unspecified: Secondary | ICD-10-CM | POA: Diagnosis present

## 2019-03-28 DIAGNOSIS — O368990 Maternal care for other specified fetal problems, unspecified trimester, not applicable or unspecified: Secondary | ICD-10-CM | POA: Diagnosis present

## 2019-03-28 DIAGNOSIS — O139 Gestational [pregnancy-induced] hypertension without significant proteinuria, unspecified trimester: Secondary | ICD-10-CM | POA: Diagnosis present

## 2019-03-28 LAB — COMPREHENSIVE METABOLIC PANEL
ALT: 17 U/L (ref 0–44)
AST: 21 U/L (ref 15–41)
Albumin: 3 g/dL — ABNORMAL LOW (ref 3.5–5.0)
Alkaline Phosphatase: 113 U/L (ref 38–126)
Anion gap: 8 (ref 5–15)
BUN: 7 mg/dL (ref 6–20)
CO2: 21 mmol/L — ABNORMAL LOW (ref 22–32)
Calcium: 8.5 mg/dL — ABNORMAL LOW (ref 8.9–10.3)
Chloride: 107 mmol/L (ref 98–111)
Creatinine, Ser: 0.6 mg/dL (ref 0.44–1.00)
GFR calc Af Amer: >60 mL/min (ref >60)
GFR calc non Af Amer: >60 mL/min (ref >60)
Glucose, Bld: 73 mg/dL (ref 70–99)
Potassium: 3.9 mmol/L (ref 3.5–5.1)
Sodium: 136 mmol/L (ref 135–145)
Total Bilirubin: 0.9 mg/dL (ref 0.3–1.2)
Total Protein: 6.5 g/dL (ref 6.5–8.1)

## 2019-03-28 LAB — URINE DRUG SCREEN, QUALITATIVE (ARMC ONLY)
Amphetamines, Ur Screen: NOT DETECTED
Barbiturates, Ur Screen: NOT DETECTED
Benzodiazepine, Ur Scrn: NOT DETECTED
Cannabinoid 50 Ng, Ur ~~LOC~~: NOT DETECTED
Cocaine Metabolite,Ur ~~LOC~~: NOT DETECTED
MDMA (Ecstasy)Ur Screen: NOT DETECTED
Methadone Scn, Ur: NOT DETECTED
Opiate, Ur Screen: NOT DETECTED
Phencyclidine (PCP) Ur S: NOT DETECTED
Tricyclic, Ur Screen: NOT DETECTED

## 2019-03-28 LAB — CBC
HCT: 37.3 % (ref 36.0–46.0)
Hemoglobin: 12.4 g/dL (ref 12.0–15.0)
MCH: 30.2 pg (ref 26.0–34.0)
MCHC: 33.2 g/dL (ref 30.0–36.0)
MCV: 91 fL (ref 80.0–100.0)
Platelets: 271 10*3/uL (ref 150–400)
RBC: 4.1 MIL/uL (ref 3.87–5.11)
RDW: 13.3 % (ref 11.5–15.5)
WBC: 12.5 10*3/uL — ABNORMAL HIGH (ref 4.0–10.5)
nRBC: 0 % (ref 0.0–0.2)

## 2019-03-28 LAB — PROTEIN / CREATININE RATIO, URINE
Creatinine, Urine: 22 mg/dL
Total Protein, Urine: <6 mg/dL

## 2019-03-28 LAB — TYPE AND SCREEN
ABO/RH(D): O POS
Antibody Screen: NEGATIVE

## 2019-03-28 LAB — SARS CORONAVIRUS 2 BY RT PCR (HOSPITAL ORDER, PERFORMED IN ~~LOC~~ HOSPITAL LAB): SARS Coronavirus 2: NEGATIVE

## 2019-03-28 MED ORDER — OXYTOCIN 10 UNIT/ML IJ SOLN
INTRAMUSCULAR | Status: AC
  Filled 2019-03-28: qty 2

## 2019-03-28 MED ORDER — AMMONIA AROMATIC IN INHA
RESPIRATORY_TRACT | Status: AC
  Filled 2019-03-28: qty 10

## 2019-03-28 MED ORDER — LIDOCAINE HCL (PF) 1 % IJ SOLN: 30.0000 mL | INTRAMUSCULAR | Status: DC | PRN

## 2019-03-28 MED ORDER — MISOPROSTOL 25 MCG QUARTER TABLET
25.0000 ug | ORAL_TABLET | ORAL | Status: DC | PRN
  Administered 2019-03-28: 20:00:00 25 ug via VAGINAL
  Filled 2019-03-28: qty 1

## 2019-03-28 MED ORDER — OXYTOCIN 10 UNIT/ML IJ SOLN: 10.0000 [IU] | Freq: Once | INTRAMUSCULAR | Status: DC

## 2019-03-28 MED ORDER — OXYTOCIN BOLUS FROM INFUSION
500.0000 mL | Freq: Once | INTRAVENOUS | Status: AC
  Administered 2019-03-30: 500 mL via INTRAVENOUS

## 2019-03-28 MED ORDER — OXYTOCIN 40 UNITS IN NORMAL SALINE INFUSION - SIMPLE MED
2.5000 [IU]/h | INTRAVENOUS | Status: DC
  Filled 2019-03-28: qty 1000

## 2019-03-28 MED ORDER — LIDOCAINE HCL (PF) 1 % IJ SOLN
INTRAMUSCULAR | Status: AC
  Filled 2019-03-28: qty 30

## 2019-03-28 MED ORDER — SOD CITRATE-CITRIC ACID 500-334 MG/5ML PO SOLN: 30.0000 mL | ORAL | Status: DC | PRN

## 2019-03-28 MED ORDER — MISOPROSTOL 200 MCG PO TABS
ORAL_TABLET | ORAL | Status: AC
  Administered 2019-03-28: 25 ug via VAGINAL
  Filled 2019-03-28: qty 4

## 2019-03-28 MED ORDER — TERBUTALINE SULFATE 1 MG/ML IJ SOLN: 0.2500 mg | Freq: Once | INTRAMUSCULAR | Status: DC | PRN

## 2019-03-28 MED ORDER — LACTATED RINGERS IV SOLN
INTRAVENOUS | Status: DC
  Administered 2019-03-28 – 2019-03-30 (×5): via INTRAVENOUS

## 2019-03-28 MED ORDER — LACTATED RINGERS IV SOLN: 500.0000 mL | INTRAVENOUS | Status: DC | PRN

## 2019-03-28 MED ORDER — ONDANSETRON HCL 4 MG/2ML IJ SOLN: 4.0000 mg | Freq: Four times a day (QID) | INTRAMUSCULAR | Status: DC | PRN

## 2019-03-28 MED ORDER — OXYTOCIN 40 UNITS IN NORMAL SALINE INFUSION - SIMPLE MED
INTRAVENOUS | Status: AC
  Filled 2019-03-28: qty 1000

## 2019-03-28 MED ORDER — OXYTOCIN 40 UNITS IN NORMAL SALINE INFUSION - SIMPLE MED
1.0000 m[IU]/min | INTRAVENOUS | Status: DC
  Administered 2019-03-28: 1 m[IU]/min via INTRAVENOUS

## 2019-03-28 NOTE — Progress Notes (Signed)
    Virtual Visit via Telephone Note  I connected with Heather [name] on 03/28/19 at  8:10 AM EDT by telephone and verified that I am speaking with the correct person using two identifiers.  Location: Patient: Home  Provider: Office   I discussed the limitations, risks, security and privacy concerns of performing an evaluation and management service by telephone and the availability of in person appointments.  The patient expressed understanding and agreed to proceed.  See note below for documentation of the visit.   The patient was advised seek an in-person evaluation now in Labor and Delivery and plans to do so.  I provided 7 minutes of non-face-to-face time during this encounter.   [name], CNM   Subjective  Heather [name] is a 23 y.o. G2P0010 at [redacted]w[redacted]d being seen today for ongoing prenatal care.  She is currently monitored for the following issues for this high-risk pregnancy and has Family history of breast cancer in female; Acne; Supervision of high risk pregnancy, antepartum; Seizure (HCC); and Preterm contractions on their problem list.  ----------------------------------------------------------------------------------- Patient reports decreased fetal movement. She has not felt movement from baby for the last two days.  She has been experiencing some back pain and sharp vaginal pain; these have been ongoing since her last office visit. Reports headaches for which she takes Tylenol PM and sleeps with resolution afterwards. Denies visual changes and epigastric pain. Has not had any recent seizures. Contractions: Not present. Vag. Bleeding: None.  Movement: (!) Decreased. No leaking of fluid.  ----------------------------------------------------------------------------------- The following portions of the patient's history were reviewed and updated as appropriate: allergies, current medications, past family history, past medical history, past social history, past  surgical history and problem list. Problem list updated.  Objective  Last menstrual period 07/26/2018. Pregravid weight 140 lb (63.5 kg) Total Weight Gain 81 lb (36.7 kg)  Physical exam not possible due to telephone visit.  Assessment   22 y.o. G2P0010 at [redacted]w[redacted]d EDD 04/13/2019, by Ultrasound presenting for a  prenatal telephone visit, with decreased fetal movement.  Plan   SECOND Problems (from 09/21/18 to present)    Problem Noted Resolved   Supervision of high risk pregnancy, antepartum 09/23/2018 by [name], CNM No   Overview Addendum 02/06/2019 10:02 AM by [name], CNM    Clinic [facility] Prenatal Labs  Dating confirmed Blood type: O/Positive/-- (11/15 1452)   Genetic Screen Normal XY Antibody:Negative (11/15 1452)  Anatomic [place] [facility] Rubella: <0.90 (11/15 1452) Varicella:I    GTT  Third trimester: 72 RPR: Non Reactive (11/15 1452)   Rhogam na HBsAg: Negative (11/15 1452)   TDaP vaccine  02/06/2019               Flu Shot: HIV: Non Reactive (11/15 1452)   Baby Food                                GBS:   Contraception  Pap:too young  CBB     CS/VBAC na   Support Person                 Advised patient to go to Labor and Delivery now for decreased fetal movement.   [name], CNM 03/28/2019  8:30 AM

## 2019-03-28 NOTE — H&P (Signed)
OB History & Physical   History of Present Illness:  Chief Complaint: Decreased fetal movement  HPI:  Heather [name] is a 23 y.o. G2P0010 female at [redacted]w[redacted]d dated by 12 week ultrasound.  Her pregnancy has been complicated by seizures (on keppra, last seizure in 09/2018), marijuana on UDS at beginning of pregnancy.    She denies contractions.   She denies leakage of fluid.   She reports vaginal bleeding.   She had an episode of spotting earlier today after voiding.  None currently.  She denies fetal movement.  She has not felt fetal movement for the past two days.  Maternal Medical History:   Past Medical History:  Diagnosis Date  . Seizures (HCC)    last seizure November 2019    Past Surgical History:  Procedure Laterality Date  . NO PAST SURGERIES     Allergies: No Known Allergies  Prior to Admission medications   Medication Sig Start Date End Date Taking? Authorizing Provider  levETIRAcetam (KEPPRA) 500 MG tablet Take 1 tablet (500 mg total) by mouth 2 (two) times daily. 11/23/18  Yes Nadara Mustard, MD    OB History  Gravida Para Term Preterm AB Living  2 0 0 0 1 0  SAB TAB Ectopic Multiple Live Births  1 0 0 0      # Outcome Date GA Lbr Len/2nd Weight Sex Delivery Anes PTL Lv  2 Current           1 SAB             Prenatal care site: [facility]  Social History: She  reports that she quit smoking about 7 months ago. Her smoking use included cigarettes. She has never used smokeless tobacco. She reports that she does not drink alcohol or use drugs.  Family History: family history includes Cancer in her maternal grandmother and maternal uncle; Hypertension in her father and paternal grandmother.   Review of Systems:  Review of Systems  Constitutional: Negative.   HENT: Negative.   Eyes: Negative.   Respiratory: Negative.   Cardiovascular: Negative.   Gastrointestinal: Negative.   Genitourinary: Negative.   Musculoskeletal: Negative.   Skin: Negative.    Neurological: Negative.   Psychiatric/Behavioral: Negative.      Physical Exam:  Vital Signs: BP 117/68 (BP Location: Left Arm)   Pulse 91   Temp (!) 97.4 F (36.3 C) (Oral)   Resp 16   LMP 07/26/2018 (Exact Date)   SpO2 99%  Physical Exam Constitutional:      General: She is not in acute distress.    Appearance: Normal appearance.  Genitourinary:     Genitourinary Comments: Cervix:  0.5 cm per RN  HENT:     Head: Normocephalic and atraumatic.  Eyes:     General: No scleral icterus.    Conjunctiva/sclera: Conjunctivae normal.  Neck:     Musculoskeletal: Normal range of motion and neck supple. No neck rigidity.  Cardiovascular:     Rate and Rhythm: Normal rate and regular rhythm.     Heart sounds: No murmur. No friction rub. No gallop.   Pulmonary:     Effort: Pulmonary effort is normal.     Breath sounds: Normal breath sounds. No wheezing, rhonchi or rales.  Abdominal:     Comments: Gravid, NT  Musculoskeletal: Normal range of motion.        General: No swelling.  Neurological:     General: No focal deficit present.     Mental  Status: She is alert and oriented to person, place, and time.     Cranial Nerves: No cranial nerve deficit.  Skin:    General: Skin is warm and dry.  Psychiatric:        Mood and Affect: Mood normal.        Behavior: Behavior normal.        Judgment: Judgment normal.    Pertinent Results:  Prenatal Labs: Blood type/Rh O positive  Antibody screen negative  Rubella Not immune  Varicella Immune    RPR NR  HBsAg negative  HIV negative  GC negative  Chlamydia negative  Genetic screening NIPT, XY  1 hour GTT 72  3 hour GTT n/a  GBS negative on 03/20/2019   Baseline FHR: 145 beats/min   Variability: moderate   Accelerations: present   Decelerations: present (intermittent decelerations (initially to 90s, several to 110s). Contractions: infrequent Overall assessment: cat 2  Bedside Ultrasound:  Number of Fetus: 1  Presentation:  cephalic  Fluid: MVP > 2 cm  Placental Location: anterior  BPP performed by me: Component  Normal (2 points) Abnormal (0 points)  Fetal Breathing Movements One or more episodes of fetal breathing lasting at least 30 seconds within 30 minutes. No episodes of fetal breathing movements lasting at least 30 seconds during a 30 minute period of observation.  Gross Body Movement 3 or more discrete body or limb movements within 30 minutes  Less than 3 body or limb movements in 30 minutes  Fetal Tone One or more episodes of active extension and flexion of a fetal extremity OR opening and closing of the hand within 30 minutes  Slow extension with no return or slow return to flexion of a fetal extremity  OR no  fetal movement  Amniotic Fluid Volume * A single deepest vertical pocket of amniotic fluid measures greater than 2 centimeters. is present  A single deepest vertical pocket of amniotic fluid measures 2 centimeters or less  Non-stress test (NST)** Reactive  Nonreactive  *Amniotic Fluid Volume Measured as the vertical measurement , in centimeters, of the single deepest pocket of amniotic fluid with a transverse measurement of 1 cm or more wide  without fetal small parts or umbilical cord . [2]  ** Reactive Two or more fetal heart rate accelerations that peak (but do not necessarily remain) at least 15 beats per minute above the baseline and last at least 15 seconds from baseline to baseline during 20 minutes of observation **Nonreactive Less than two accelerations of fetal heart rate as described above after 40 minutes of observation [1]   Score: 6/10 with normal fluid.     Assessment:  [name] is a 23 y.o. 262P0010 female at 3143w5d with non-reassuring fetal status. She has continued to have a a fetal tracing that shows decelerations.  Per ACOG, she has been monitored and with continued decelerations will move to delivery.  Will start with Contraction Stress Test.  If she passes, will  consider starting labor induction. Otherwise, will deliver by cesarean section.  Discussed all the above with the patient and she voiced understanding and agreement.     Plan:  1. Admit to Labor & Delivery  2. CBC, T&S, NPO (for CST. If IOL, will change to clears), IVF 3. GBS negative.   4. Fetwal well-being: equivocal, but concerning. Moving to deliver, as above 5. CST to assess placental function. If passes, will induce. Otherwise, will move to cesarean delivery.    Thomasene MohairStephen , MD 03/28/2019  2:05 PM

## 2019-03-28 NOTE — Progress Notes (Signed)
ROB- baby is not moving much for about 2 days now

## 2019-03-29 LAB — RPR: RPR Ser Ql: NONREACTIVE

## 2019-03-29 MED ORDER — MISOPROSTOL 200 MCG PO TABS
ORAL_TABLET | ORAL | Status: AC
  Filled 2019-03-29: qty 4

## 2019-03-29 MED ORDER — AMMONIA AROMATIC IN INHA
RESPIRATORY_TRACT | Status: AC
  Filled 2019-03-29: qty 10

## 2019-03-29 MED ORDER — BUPIVACAINE HCL (PF) 0.5 % IJ SOLN
INTRAMUSCULAR | Status: AC
  Filled 2019-03-29: qty 30

## 2019-03-29 MED ORDER — OXYTOCIN 10 UNIT/ML IJ SOLN
INTRAMUSCULAR | Status: AC
  Filled 2019-03-29: qty 2

## 2019-03-29 MED ORDER — LIDOCAINE HCL (PF) 1 % IJ SOLN
INTRAMUSCULAR | Status: AC
  Filled 2019-03-29: qty 30

## 2019-03-29 NOTE — Progress Notes (Signed)
Spoke with patient to review plan. She has had another prolonged deceleration, but the fetal tracing is category 1 at this time. Also, she has had some elevated blood pressures and currently has a headache. Will monitor closely, but it appears she may have gestation hypertension. Will continue to monitor for severe signs.  Her preeclampsia labs are normal.  As for her labor, will switch to pitocin at this time.  Cervix is 1.5 cm per RN. Offered option for more contraction control.  She is having contractions that are quite close together.  We discussed that we may have more control with pitocin.  She refused a foley balloon to help with cervical ripening. Will start low-dose pitocin.  If she has another prolonged deceleration or concerning fetal tracing, we discussed that we will likely move to c-section for delivery.  She voiced understanding and agreement with this plan.   Thomasene Mohair, MD, [facility], [facility] 03/29/2019 12:16 AM

## 2019-03-29 NOTE — Progress Notes (Signed)
  Labor Progress Note   22 y.o. G2P0010 @ [redacted]w[redacted]d , admitted for  Pregnancy, Labor Management. IOL  Subjective:  Not feeling contractions  Objective:  BP 133/83 (BP Location: Left Arm)   Pulse 91   Temp 98 F (36.7 C) (Oral)   Resp 18   Ht 5\' 6"  (1.676 m)   Wt 100.2 kg   LMP 07/26/2018 (Exact Date)   SpO2 99%   BMI 35.67 kg/m  Abd: gravid, ND, FHT present, mild tenderness on exam Extr: no edema SVE: CERVIX: 2.5 cm dilated, 80 effaced, -2,-1 station  EFM: FHR: 145 bpm, variability: moderate,  accelerations:  Present,  decelerations:  Absent Toco: Frequency: difficult to trace- adjusting monitors, most recent value per RN is 2-32m Labs: I have reviewed the patient's lab results.   Assessment & Plan:  G2P0010 @ [redacted]w[redacted]d, admitted for  Pregnancy and Labor/Delivery Management  1. Pain management: none. 2. FWB: FHT category I.  3. ID: GBS negative 4. Labor management: continue to titrate pitocin once adequate tracing  All discussed with patient, see orders   Tresea Mall, CNM [facility] 03/29/2019  8:57 AM

## 2019-03-30 ENCOUNTER — Encounter: Payer: Self-pay | Admitting: Anesthesiology

## 2019-03-30 ENCOUNTER — Inpatient Hospital Stay: Payer: Medicaid Other | Admitting: Anesthesiology

## 2019-03-30 DIAGNOSIS — Z3A37 37 weeks gestation of pregnancy: Secondary | ICD-10-CM

## 2019-03-30 DIAGNOSIS — O134 Gestational [pregnancy-induced] hypertension without significant proteinuria, complicating childbirth: Secondary | ICD-10-CM

## 2019-03-30 MED ORDER — PHENYLEPHRINE 40 MCG/ML (10ML) SYRINGE FOR IV PUSH (FOR BLOOD PRESSURE SUPPORT): 80.0000 ug | PREFILLED_SYRINGE | INTRAVENOUS | Status: DC | PRN

## 2019-03-30 MED ORDER — DIPHENHYDRAMINE HCL 25 MG PO CAPS: 25.0000 mg | ORAL_CAPSULE | Freq: Four times a day (QID) | ORAL | Status: DC | PRN

## 2019-03-30 MED ORDER — WITCH HAZEL-GLYCERIN EX PADS: 1.0000 "application " | MEDICATED_PAD | CUTANEOUS | Status: DC | PRN

## 2019-03-30 MED ORDER — OXYCODONE-ACETAMINOPHEN 5-325 MG PO TABS: 2.0000 | ORAL_TABLET | ORAL | Status: DC | PRN

## 2019-03-30 MED ORDER — DIBUCAINE (PERIANAL) 1 % EX OINT: 1.0000 "application " | TOPICAL_OINTMENT | CUTANEOUS | Status: DC | PRN

## 2019-03-30 MED ORDER — COCONUT OIL OIL
1.0000 "application " | TOPICAL_OIL | Status: DC | PRN
  Administered 2019-03-31: 1 via TOPICAL
  Filled 2019-03-30: qty 120

## 2019-03-30 MED ORDER — EPHEDRINE 5 MG/ML INJ: 10.0000 mg | INTRAVENOUS | Status: DC | PRN

## 2019-03-30 MED ORDER — SENNOSIDES-DOCUSATE SODIUM 8.6-50 MG PO TABS
2.0000 | ORAL_TABLET | ORAL | Status: DC
  Filled 2019-03-30 (×2): qty 2

## 2019-03-30 MED ORDER — SIMETHICONE 80 MG PO CHEW: 80.0000 mg | CHEWABLE_TABLET | ORAL | Status: DC | PRN

## 2019-03-30 MED ORDER — IBUPROFEN 600 MG PO TABS
600.0000 mg | ORAL_TABLET | Freq: Four times a day (QID) | ORAL | Status: DC
  Administered 2019-03-30 – 2019-03-31 (×3): 600 mg via ORAL
  Filled 2019-03-30 (×3): qty 1

## 2019-03-30 MED ORDER — FENTANYL 2.5 MCG/ML W/ROPIVACAINE 0.15% IN NS 100 ML EPIDURAL (ARMC)
EPIDURAL | Status: AC
  Filled 2019-03-30: qty 100

## 2019-03-30 MED ORDER — LIDOCAINE HCL (PF) 1 % IJ SOLN
INTRAMUSCULAR | Status: DC | PRN
  Administered 2019-03-30: 3 mL

## 2019-03-30 MED ORDER — LACTATED RINGERS IV SOLN: 500.0000 mL | Freq: Once | INTRAVENOUS | Status: DC

## 2019-03-30 MED ORDER — BENZOCAINE-MENTHOL 20-0.5 % EX AERO: 1.0000 "application " | INHALATION_SPRAY | CUTANEOUS | Status: DC | PRN

## 2019-03-30 MED ORDER — LEVETIRACETAM 500 MG PO TABS
500.0000 mg | ORAL_TABLET | Freq: Two times a day (BID) | ORAL | Status: DC
  Administered 2019-03-30 – 2019-03-31 (×4): 500 mg via ORAL
  Filled 2019-03-30 (×7): qty 1

## 2019-03-30 MED ORDER — OXYCODONE-ACETAMINOPHEN 5-325 MG PO TABS: 1.0000 | ORAL_TABLET | ORAL | Status: DC | PRN

## 2019-03-30 MED ORDER — MEASLES, MUMPS & RUBELLA VAC IJ SOLR: 0.5000 mL | INTRAMUSCULAR | Status: DC | PRN

## 2019-03-30 MED ORDER — FENTANYL 2.5 MCG/ML W/ROPIVACAINE 0.15% IN NS 100 ML EPIDURAL (ARMC)
EPIDURAL | Status: DC | PRN
  Administered 2019-03-30: 12 mL/h via EPIDURAL

## 2019-03-30 MED ORDER — DIPHENHYDRAMINE HCL 50 MG/ML IJ SOLN: 12.5000 mg | INTRAMUSCULAR | Status: DC | PRN

## 2019-03-30 MED ORDER — BUPIVACAINE HCL (PF) 0.25 % IJ SOLN
INTRAMUSCULAR | Status: DC | PRN
  Administered 2019-03-30: 10 mL via EPIDURAL

## 2019-03-30 MED ORDER — LIDOCAINE-EPINEPHRINE (PF) 1.5 %-1:200000 IJ SOLN
INTRAMUSCULAR | Status: DC | PRN
  Administered 2019-03-30: 3 mL via PERINEURAL

## 2019-03-30 MED ORDER — PRENATAL MULTIVITAMIN CH
1.0000 | ORAL_TABLET | Freq: Every day | ORAL | Status: DC
  Administered 2019-03-30 – 2019-03-31 (×2): 1 via ORAL
  Filled 2019-03-30 (×2): qty 1

## 2019-03-30 MED ORDER — FENTANYL 2.5 MCG/ML W/ROPIVACAINE 0.15% IN NS 100 ML EPIDURAL (ARMC): 12.0000 mL/h | EPIDURAL | Status: DC

## 2019-03-30 MED ORDER — ONDANSETRON HCL 4 MG PO TABS: 4.0000 mg | ORAL_TABLET | ORAL | Status: DC | PRN

## 2019-03-30 MED ORDER — ONDANSETRON HCL 4 MG/2ML IJ SOLN: 4.0000 mg | INTRAMUSCULAR | Status: DC | PRN

## 2019-03-30 MED ORDER — ACETAMINOPHEN 325 MG PO TABS
650.0000 mg | ORAL_TABLET | ORAL | Status: DC | PRN
  Administered 2019-03-30 – 2019-04-01 (×5): 650 mg via ORAL
  Filled 2019-03-30 (×5): qty 2

## 2019-03-30 NOTE — Anesthesia Preprocedure Evaluation (Signed)
Anesthesia Evaluation  Patient identified by MRN, date of birth, ID band Patient awake    Reviewed: Allergy & Precautions, NPO status , Patient's Chart, lab work & pertinent test results  Airway Mallampati: II  TM Distance: >3 FB     Dental  (+) Teeth Intact   Pulmonary former smoker,    Pulmonary exam normal        Cardiovascular negative cardio ROS Normal cardiovascular exam     Neuro/Psych Seizures -,  negative psych ROS   GI/Hepatic negative GI ROS, Neg liver ROS,   Endo/Other  negative endocrine ROS  Renal/GU negative Renal ROS  negative genitourinary   Musculoskeletal negative musculoskeletal ROS (+)   Abdominal Normal abdominal exam  (+)   Peds negative pediatric ROS (+)  Hematology negative hematology ROS (+)   Anesthesia Other Findings   Reproductive/Obstetrics (+) Pregnancy                             Anesthesia Physical Anesthesia Plan  ASA: II  Anesthesia Plan: Epidural   Post-op Pain Management:    Induction:   PONV Risk Score and Plan:   Airway Management Planned: Natural Airway  Additional Equipment:   Intra-op Plan:   Post-operative Plan:   Informed Consent: I have reviewed the patients History and Physical, chart, labs and discussed the procedure including the risks, benefits and alternatives for the proposed anesthesia with the patient or authorized representative who has indicated his/her understanding and acceptance.     Dental advisory given  Plan Discussed with: CRNA and Surgeon  Anesthesia Plan Comments:         Anesthesia Quick Evaluation

## 2019-03-30 NOTE — Discharge Summary (Deleted)
Delivery Note Primary OB: [facility] Delivery Provider: [name], CNM Gestational Age: Full term Antepartum complications: pregnancy-induced hypertension Intrapartum complications: None  A viable Female was delivered via vertex presentation, LOA. No nuchal cord was present. Delivery of the shoulders and body followed without difficulty. The infant was placed on the maternal abdomen. The umbilical cord was doubly clamped and cut following delayed cord clamping. Cord blood was collected. The placenta was delivered spontaneously and was inspected and found to be intact with a three vessel cord. The cervix and vagina were inspected. There was a hemostatic right vaginal abrasion that did not require repair.There were no perineal lacerations. The fundus was firm. Patient and infant were bonding in stable condition. All counts were correct.  Anesthesia:  epidural Episiotomy:  none Lacerations:  right vaginal abrasion Suture Repair: none Quantitative. Blood Loss (mL): 110  Mom to postpartum.  Baby to Couplet care / Skin to Skin.  [name], CNM [facility], [facility] 03/30/2019  11:33 AM

## 2019-03-30 NOTE — Discharge Summary (Addendum)
OB Discharge Summary     Patient Name: Heather [name] DOB: 1995/11/30 MRN: [id number]  Date of admission: 03/28/2019 Delivering Provider: [name], CNM  Date of Delivery: 03/30/2019  Date of discharge: 04/01/2019 Admitting diagnosis: Non-reassuring fetal status Intrauterine pregnancy: [redacted]w[redacted]d     Secondary diagnosis: Gestational Hypertension     Discharge diagnosis: Term Pregnancy Delivered, Gestational hypertension                        Hospital course:  Induction of Labor With Vaginal Delivery   23 y.o. yo G2P0010 at [redacted]w[redacted]d was admitted to the hospital 03/28/2019 for induction of labor.  Indication for induction: Gestational hypertension, Decreased Fetal movement.  Patient had an uncomplicated labor course as follows: Membrane Rupture Time/Date: 4:40 AM ,03/30/2019   Intrapartum Procedures: Episiotomy: None [1]                                         Lacerations:  Vaginal abrasion Patient had delivery of a Viable infant.  Information for the patient's newborn:  Jahlisa, Owens [[id number]]  Delivery Method: Vag-Spont   03/30/2019  Details of delivery can be found in separate delivery note.  Patient had a routine postpartum course. Patient is discharged home 04/01/19.                                                                 Post partum procedures: none  Complications: None  Physical exam on 04/01/2019: Vitals:   03/31/19 0744 03/31/19 1638 04/01/19 0015 04/01/19 0913  BP: 129/87 130/76 136/84 136/88  Pulse: 85 68 89 88  Resp: 20 20 17 18   Temp: 98.7 F (37.1 C) 98.6 F (37 C) 98.7 F (37.1 C) 99 F (37.2 C)  TempSrc: Oral Oral Oral Oral  SpO2: 97%  98% 99%  Weight:      Height:       General: alert, cooperative and no distress Lochia: appropriate Uterine Fundus: firm Incision: N/A DVT Evaluation: No evidence of DVT seen on physical exam.  Labs: Lab Results  Component Value Date   WBC 11.9 (H) 03/31/2019   HGB 11.2 (L) 03/31/2019   HCT 33.9  (L) 03/31/2019   MCV 93.4 03/31/2019   PLT 220 03/31/2019   CMP Latest Ref Rng & Units 03/28/2019  Glucose 70 - 99 mg/dL 73  BUN 6 - 20 mg/dL 7  Creatinine 9.62 - 8.36 mg/dL 6.29  Sodium 476 - 546 mmol/L 136  Potassium 3.5 - 5.1 mmol/L 3.9  Chloride 98 - 111 mmol/L 107  CO2 22 - 32 mmol/L 21(L)  Calcium 8.9 - 10.3 mg/dL 5.0(P)  Total Protein 6.5 - 8.1 g/dL 6.5  Total Bilirubin 0.3 - 1.2 mg/dL 0.9  Alkaline Phos 38 - 126 U/L 113  AST 15 - 41 U/L 21  ALT 0 - 44 U/L 17    Discharge instruction: per After Visit Summary.  Medications:  Allergies as of 04/01/2019   No Known Allergies     Medication List    TAKE these medications   acetaminophen 325 MG tablet Commonly known as:  Tylenol Take 2 tablets (650 mg total) by  mouth every 4 (four) hours as needed for mild pain or moderate pain (for pain scale < 7).   levETIRAcetam 500 MG tablet Commonly known as:  Keppra Take 1 tablet (500 mg total) by mouth 2 (two) times daily.   prenatal multivitamin Tabs tablet Take 1 tablet by mouth daily at 12 noon.   triamcinolone cream 0.1 % Commonly known as:  KENALOG APPLY TO AFFECTED AREA TWICE DAILY FOR UP TO 2 WEEKS       Diet: routine diet  Activity: Advance as tolerated. Pelvic rest for 6 weeks.   Outpatient follow up: Follow-up Information    Oswaldo ConroySchmid, Jacelyn Y, CNM. Schedule an appointment as soon as possible for a visit.   Specialties:  Certified Nurse Midwife, Radiology Why:  for a blood pressure check in 2-3 days Contact information: [address] [place] [id number] [phone]             Postpartum contraception: Undecided Rhogam Given postpartum: no Rubella vaccine given postpartum: Ordered Varicella vaccine given postpartum: no TDaP given antepartum or postpartum: Yes, antepartum on 02/06/2019  Newborn Data: Live born female  Birth Weight: 6 lb 6.3 oz (2900 g) APGAR: 8, 9  Newborn Delivery   Birth date/time:  03/30/2019 10:57:00 Delivery type:   Vaginal, Spontaneous      Baby Feeding: Breast  Disposition: home with mother  SIGNED:  Farrel ConnersColleen Alyzae Hawkey, CNM 04/01/2019 11:30 AM

## 2019-03-30 NOTE — Progress Notes (Signed)
SVE: 3/80/-2   NST: 140 bpm baseline, moderate variability, 15x15 accelerations, no decelerations. Tocometer : 3-4  Membranes swept. Continue with IV pitocin.  Adelene Idler MD [facility], [facility] 03/30/2019 1:49 AM

## 2019-03-30 NOTE — Anesthesia Procedure Notes (Signed)
Epidural Patient location during procedure: OB Start time: 03/30/2019 6:25 AM End time: 03/30/2019 6:35 AM  Staffing Anesthesiologist: Yves Dill, MD Performed: anesthesiologist   Preanesthetic Checklist Completed: patient identified, site marked, surgical consent, pre-op evaluation, timeout performed, IV checked, risks and benefits discussed and monitors and equipment checked  Epidural Patient position: sitting Prep: Betadine Patient monitoring: heart rate, continuous pulse ox and blood pressure Approach: midline Location: L3-L4 Injection technique: LOR air  Needle:  Needle type: Tuohy  Needle gauge: 17 G Needle length: 9 cm and 9 Catheter type: closed end flexible Catheter size: 19 Gauge Test dose: negative and 1.5% lidocaine with Epi 1:200 K  Assessment Events: blood not aspirated, injection not painful, no injection resistance, negative IV test and no paresthesia  Additional Notes Time out called.  Patient placed in sitting position.  Back prepped and draped in sterile fashion.  A skin wheal was made in the L3-L4 interspace with 1% Lidocaine plain.  A 17G Tuohy needle was advanced into the epidural space by a loss of resistance technique.  No blood or paresthesias.  The epidural catheter was advanced 3 cm into the epidural space and the TD was negative.  The catheter was affixed to the back in sterile fashion.  The patient tolerated the procedure well.Reason for block:procedure for pain

## 2019-03-31 LAB — CBC
HCT: 33.9 % — ABNORMAL LOW (ref 36.0–46.0)
Hemoglobin: 11.2 g/dL — ABNORMAL LOW (ref 12.0–15.0)
MCH: 30.9 pg (ref 26.0–34.0)
MCHC: 33 g/dL (ref 30.0–36.0)
MCV: 93.4 fL (ref 80.0–100.0)
Platelets: 220 10*3/uL (ref 150–400)
RBC: 3.63 MIL/uL — ABNORMAL LOW (ref 3.87–5.11)
RDW: 13.3 % (ref 11.5–15.5)
WBC: 11.9 10*3/uL — ABNORMAL HIGH (ref 4.0–10.5)
nRBC: 0 % (ref 0.0–0.2)

## 2019-03-31 MED ORDER — IBUPROFEN 600 MG PO TABS
600.0000 mg | ORAL_TABLET | Freq: Four times a day (QID) | ORAL | Status: DC
  Administered 2019-03-31 – 2019-04-01 (×4): 600 mg via ORAL
  Filled 2019-03-31 (×4): qty 1

## 2019-03-31 NOTE — Plan of Care (Signed)
Vs stable; up ad lib; tolerating regular diet; taking motrin and tylenol for pain control; breastfeeding and did need some assistance this shift

## 2019-03-31 NOTE — Anesthesia Postprocedure Evaluation (Signed)
Anesthesia Post Note  Patient: Heather [name]  Procedure(s) Performed: AN AD HOC LABOR EPIDURAL  Patient location during evaluation: Mother Baby Anesthesia Type: Epidural Level of consciousness: awake and alert Pain management: pain level controlled Vital Signs Assessment: post-procedure vital signs reviewed and stable Respiratory status: spontaneous breathing, nonlabored ventilation and respiratory function stable Cardiovascular status: stable Postop Assessment: no headache, no backache and epidural receding Anesthetic complications: no     Last Vitals:  Vitals:   03/30/19 2317 03/31/19 0744  BP: 128/62 129/87  Pulse: 98 85  Resp: 18 20  Temp: 37.1 C 37.1 C  SpO2: 98% 97%    Last Pain:  Vitals:   03/31/19 0744  TempSrc: Oral  PainSc:                  Yevette Edwards

## 2019-04-01 DIAGNOSIS — O139 Gestational [pregnancy-induced] hypertension without significant proteinuria, unspecified trimester: Secondary | ICD-10-CM | POA: Diagnosis present

## 2019-04-01 DIAGNOSIS — Z3490 Encounter for supervision of normal pregnancy, unspecified, unspecified trimester: Secondary | ICD-10-CM | POA: Diagnosis present

## 2019-04-01 MED ORDER — PRENATAL MULTIVITAMIN CH: 1.0000 | ORAL_TABLET | Freq: Every day | ORAL | Status: DC

## 2019-04-01 MED ORDER — ACETAMINOPHEN 325 MG PO TABS: 650.0000 mg | ORAL_TABLET | ORAL | Status: DC | PRN

## 2019-04-01 NOTE — Plan of Care (Signed)
Vs stable; up ad lib; taking tylenol and motrin for pain control; breast feeding and does need assisting with positioning and latching infant

## 2019-04-01 NOTE — Addendum Note (Signed)
Addendum  created 04/01/19 0954 by Omer Jack, CRNA   Clinical Note Signed

## 2019-04-01 NOTE — Discharge Instructions (Signed)
Vaginal Delivery, Care After °Refer to this sheet in the next few weeks. These instructions provide you with information about caring for yourself after vaginal delivery. Your health care provider may also give you more specific instructions. Your treatment has been planned according to current medical practices, but problems sometimes occur. Call your health care provider if you have any problems or questions. °What can I expect after the procedure? °After vaginal delivery, it is common to have: °· Some bleeding from your vagina. °· Soreness in your abdomen, your vagina, and the area of skin between your vaginal opening and your anus (perineum). °· Pelvic cramps. °· Fatigue. °Follow these instructions at home: °Medicines °· Take over-the-counter and prescription medicines only as told by your health care provider. °· If you were prescribed an antibiotic medicine, take it as told by your health care provider. Do not stop taking the antibiotic until it is finished. °Driving ° °· Do not drive or operate heavy machinery while taking prescription pain medicine. °· Do not drive for 24 hours if you received a sedative. °Lifestyle °· Do not drink alcohol. This is especially important if you are breastfeeding or taking medicine to relieve pain. °· Do not use tobacco products, including cigarettes, chewing tobacco, or e-cigarettes. If you need help quitting, ask your health care provider. °Eating and drinking °· Drink at least 8 eight-ounce glasses of water every day unless you are told not to by your health care provider. If you choose to breastfeed your baby, you may need to drink more water than this. °· Eat high-fiber foods every day. These foods may help prevent or relieve constipation. High-fiber foods include: °? Whole grain cereals and breads. °? Brown rice. °? Beans. °? Fresh fruits and vegetables. °Activity °· Return to your normal activities as told by your health care provider. Ask your health care provider what  activities are safe for you. °· Rest as much as possible. Try to rest or take a nap when your baby is sleeping. °· Do not lift anything that is heavier than your baby or 10 lb (4.5 kg) until your health care provider says that it is safe. °· Talk with your health care provider about when you can engage in sexual activity. This may depend on your: °? Risk of infection. °? Rate of healing. °? Comfort and desire to engage in sexual activity. °Vaginal Care °· If you have an episiotomy or a vaginal tear, check the area every day for signs of infection. Check for: °? More redness, swelling, or pain. °? More fluid or blood. °? Warmth. °? Pus or a bad smell. °· Do not use tampons or douches until your health care provider says this is safe. °· Watch for any blood clots that may pass from your vagina. These may look like clumps of dark red, brown, or black discharge. °General instructions °· Keep your perineum clean and dry as told by your health care provider. °· Wear loose, comfortable clothing. °· Wipe from front to back when you use the toilet. °· Ask your health care provider if you can shower or take a bath. If you had an episiotomy or a perineal tear during labor and delivery, your health care provider may tell you not to take baths for a certain length of time. °· Wear a bra that supports your breasts and fits you well. °· If possible, have someone help you with household activities and help care for your baby for at least a few days after you   leave the hospital.  Keep all follow-up visits for you and your baby as told by your health care provider. This is important. Contact a health care provider if:  You have: ? Vaginal discharge that has a bad smell. ? Difficulty urinating. ? Pain when urinating. ? A sudden increase or decrease in the frequency of your bowel movements. ? More redness, swelling, or pain around your episiotomy or vaginal tear. ? More fluid or blood coming from your episiotomy or vaginal  tear. ? Pus or a bad smell coming from your episiotomy or vaginal tear. ? A fever. ? A rash. ? Little or no interest in activities you used to enjoy. ? Questions about caring for yourself or your baby.  Your episiotomy or vaginal tear feels warm to the touch.  Your episiotomy or vaginal tear is separating or does not appear to be healing.  Your breasts are painful, hard, or turn red.  You feel unusually sad or worried.  You feel nauseous or you vomit.  You pass large blood clots from your vagina. If you pass a blood clot from your vagina, save it to show to your health care provider. Do not flush blood clots down the toilet without having your health care provider look at them.  You urinate more than usual.  You are dizzy or light-headed.  You have not breastfed at all and you have not had a menstrual period for 12 weeks after delivery.  You have stopped breastfeeding and you have not had a menstrual period for 12 weeks after you stopped breastfeeding. Get help right away if:  You have: ? Pain that does not go away or does not get better with medicine, including a headache ? Chest pain. ? Difficulty breathing. ? Blurred vision or spots in your vision. ? Thoughts about hurting yourself or your baby.  You develop pain in your abdomen or in one of your legs.  You develop a severe headache.  You faint.  You bleed from your vagina so much that you fill two sanitary pads in one hour. This information is not intended to replace advice given to you by your health care provider. Make sure you discuss any questions you have with your health care provider. Document Released: 10/21/2000 Document Revised: 04/06/2016 Document Reviewed: 11/08/2015 Elsevier Interactive Patient Education  2019 [employer]     Perinatal Depression When a woman feels excessive sadness, anger, or anxiety during pregnancy or during the first 12 months after she gives birth, she has a condition called  perinatal depression. Depression can interfere with work, school, relationships, and other everyday activities. If it is not managed properly, it can also cause problems in the mother and her baby. Sometimes, perinatal depression is left untreated because symptoms are thought to be normal mood swings during and right after pregnancy. If you have symptoms of depression, it is important to talk with your health care provider. What are the causes? The exact cause of this condition is not known. Hormonal changes during and after pregnancy may play a role in causing perinatal depression. What increases the risk? You are more likely to develop this condition if:  You have a personal or family history of depression, anxiety, or mood disorders.  You experience a stressful life event during pregnancy, such as the death of a loved one.  You have a lot of regular life stress.  You do not have support from family members or loved ones, or you are in an abusive relationship. What  are the signs or symptoms? Symptoms of this condition include:  Feeling sad or hopeless.  Feelings of guilt.  Feeling irritable or overwhelmed.  Changes in your appetite.  Lack of energy or motivation.  Sleep problems.  Difficulty concentrating or completing tasks.  Loss of interest in hobbies or relationships.  Headaches or stomach problems that do not go away. How is this diagnosed? This condition is diagnosed based on a physical exam and mental evaluation. In some cases, your health care provider may use a depression screening tool. These tools include a list of questions that can help a health care provider diagnose depression. Your health care provider may refer you to a mental health expert who specializes in depression. How is this treated? This condition may be treated with:  Medicines. Your health care provider will only give you medicines that have been proven safe for pregnancy and breastfeeding.  Talk  therapy with a mental health professional to help change your patterns of thinking (cognitive behavioral therapy).  Support groups.  Brain stimulation or light therapies.  Stress reduction therapies, such as mindfulness. Follow these instructions at home: Lifestyle  Do not use any products that contain nicotine or tobacco, such as cigarettes and e-cigarettes. If you need help quitting, ask your health care provider.  Do not use alcohol when you are pregnant. After your baby is born, limit alcohol intake to no more than 1 drink a day. One drink equals 12 oz of beer, 5 oz of wine, or 1 oz of hard liquor.  Consider joining a support group for new mothers. Ask your health care provider for recommendations.  Take good care of yourself. Make sure you: ? Get plenty of sleep. If you are having trouble sleeping, talk with your health care provider. ? Eat a healthy diet. This includes plenty of fruits and vegetables, whole grains, and lean proteins. ? Exercise regularly, as told by your health care provider. Ask your health care provider what exercises are safe for you. General instructions  Take over-the-counter and prescription medicines only as told by your health care provider.  Talk with your partner or family members about your feelings during pregnancy. Share any concerns or anxieties that you may have.  Ask for help with tasks or chores when you need it. Ask friends and family members to provide meals, watch your children, or help with cleaning.  Keep all follow-up visits as told by your health care provider. This is important. Contact a health care provider if:  You (or people close to you) notice that you have any symptoms of depression.  You have depression and your symptoms get worse.  You experience side effects from medicines, such as nausea or sleep problems. Get help right away if:  You feel like hurting yourself, your baby, or someone else. If you ever feel like you may  hurt yourself or others, or have thoughts about taking your own life, get help right away. You can go to your nearest emergency department or call:  Your local emergency services (911 in the [place]  A suicide crisis helpline, such as the National Suicide Prevention Lifeline at 361-336-62371-206-743-1573. This is open 24 hours a day. Summary  Perinatal depression is when a woman feels excessive sadness, anger, or anxiety during pregnancy or during the first 12 months after she gives birth.  If perinatal depression is not treated, it can lead to health problems for the mother and her baby.  This condition is treated with medicines, talk therapy, stress  reduction therapies, or a combination of two or more treatments.  Talk with your partner or family members about your feelings. Do not be afraid to ask for help. This information is not intended to replace advice given to you by your health care provider. Make sure you discuss any questions you have with your health care provider. Document Released: 12/21/2016 Document Revised: 12/21/2016 Document Reviewed: 12/21/2016 Elsevier Interactive Patient Education  [employer].

## 2019-04-01 NOTE — Progress Notes (Signed)
Post Partum Day 2 Subjective: no complaints, up ad lib, voiding and tolerating PO. Breast feeding. Ready for discharge now that baby has been discharged.  Objective: Blood pressure 136/84, pulse 89, temperature 98.7 F (37.1 C), temperature source Oral, resp. rate 17, height 5\' 6"  (1.676 m), weight 100.2 kg, last menstrual period 07/26/2018, SpO2 98 %, unknown if currently breastfeeding.  Physical Exam:  General: alert, cooperative and no distress Lochia: appropriate Uterine Fundus: firm/ U/ ML  DVT Evaluation: No evidence of DVT seen on physical exam.  Recent Labs    03/31/19 0605  HGB 11.2*  HCT 33.9*  WBC 11.9*  PLT 220    Assessment/Plan: Discharge home. Discharge summary amended to reflect discharge today rather than yesterday Baby was observed overnight   LOS: 4 days   Farrel Conners 04/01/2019, 8:52 AM

## 2019-04-01 NOTE — Anesthesia Postprocedure Evaluation (Signed)
Anesthesia Post Note  Patient: Heather [name]  Procedure(s) Performed: AN AD HOC LABOR EPIDURAL  Patient location during evaluation: Mother Baby Anesthesia Type: Epidural Level of consciousness: awake and alert and oriented Pain management: pain level controlled Vital Signs Assessment: post-procedure vital signs reviewed and stable Respiratory status: spontaneous breathing Cardiovascular status: stable Postop Assessment: patient able to bend at knees, no apparent nausea or vomiting, adequate PO intake, able to ambulate, no headache and no backache Anesthetic complications: no     Last Vitals:  Vitals:   04/01/19 0015 04/01/19 0913  BP: 136/84 136/88  Pulse: 89 88  Resp: 17 18  Temp: 37.1 C 37.2 C  SpO2: 98% 99%    Last Pain:  Vitals:   04/01/19 0913  TempSrc: Oral  PainSc:                  Zachary George

## 2019-04-04 ENCOUNTER — Ambulatory Visit (INDEPENDENT_AMBULATORY_CARE_PROVIDER_SITE_OTHER): Payer: Medicaid Other | Admitting: Certified Nurse Midwife

## 2019-04-04 ENCOUNTER — Other Ambulatory Visit: Payer: Self-pay

## 2019-04-04 ENCOUNTER — Encounter: Payer: Self-pay | Admitting: Certified Nurse Midwife

## 2019-04-04 VITALS — BP 132/82 | HR 77 | Ht 66.0 in | Wt 217.8 lb

## 2019-04-04 DIAGNOSIS — O134 Gestational [pregnancy-induced] hypertension without significant proteinuria, complicating childbirth: Secondary | ICD-10-CM

## 2019-04-04 NOTE — Progress Notes (Signed)
  HPI: 23 year old BF G2 P1011, now 5 days postpartum, presents for a blood pressure check. Her labor was induced for gestational hypertension and she had an SVD of a 6#6oz baby boy 03/30/2019. She is not currently on any antihypertensives.Reports feeling occasional substernal chest pain and frontal headaches since being discharged from the hospital on 5/25. Her headaches resolve with Tylenol and are accompanied by nasal congestion. No palpitations with chest pain. She though her pain was due to engorged breasts. She is breast feeding and supplementing with formula. She is getting some sleep at night.  PMHx: She  has a past medical history of Seizures (HCC). Also,  has a past surgical history that includes No past surgeries., family history includes Cancer in her maternal grandmother and maternal uncle; Hypertension in her father and paternal grandmother.,  reports that she quit smoking about 8 months ago. Her smoking use included cigarettes. She has never used smokeless tobacco. She reports that she does not drink alcohol or use drugs.  She has a current medication list which includes the following prescription(s): acetaminophen, levetiracetam, prenatal multivitamin, and triamcinolone cream. Also, has No Known Allergies.  ROS  Objective: BP 124/90   Ht 5\' 6"  (1.676 m)   Wt 217 lb 12.8 oz (98.8 kg)   LMP 07/26/2018 (Exact Date)   Breastfeeding Yes   BMI 35.15 kg/m  Repeat blood pressure 132/82. Pulse O2 98% Pulse 77  Physical examination General: BF in NAD, appears comfortable Heart: RRR with Grade II/VI systolic murmur in all areas, but best heart in aortic area Lungs: CTAB/ normal respiratory effort Extremities: trace edema Psyche: mood and affect normal Neuro: oriented and alert    Assessment: History of gestational hypertension now 5 days postpartum  Blood pressure mild to normal range today  Intermittent substernal pain-unsure etiology    Plan: Return in 1 week for BP check and  follow up Encouraged to go to ER with persistent chest pain or difficulty breathing  Farrel Conners, CNM

## 2019-04-11 ENCOUNTER — Ambulatory Visit: Payer: Medicaid Other | Admitting: Certified Nurse Midwife

## 2019-06-03 DIAGNOSIS — B009 Herpesviral infection, unspecified: Secondary | ICD-10-CM

## 2019-06-03 DIAGNOSIS — L309 Dermatitis, unspecified: Secondary | ICD-10-CM

## 2019-06-04 ENCOUNTER — Ambulatory Visit: Payer: Medicaid Other

## 2019-07-18 ENCOUNTER — Ambulatory Visit: Payer: Medicaid Other | Admitting: Advanced Practice Midwife

## 2019-07-18 ENCOUNTER — Other Ambulatory Visit: Payer: Self-pay

## 2019-07-18 ENCOUNTER — Encounter: Payer: Self-pay | Admitting: Advanced Practice Midwife

## 2019-07-18 VITALS — BP 122/70 | Ht 65.5 in | Wt 190.2 lb

## 2019-07-18 DIAGNOSIS — Z113 Encounter for screening for infections with a predominantly sexual mode of transmission: Secondary | ICD-10-CM | POA: Diagnosis not present

## 2019-07-18 LAB — WET PREP FOR TRICH, YEAST, CLUE
Clue Cell Exam: POSITIVE — AB
Trichomonas Exam: NEGATIVE
Yeast Exam: NEGATIVE

## 2019-07-18 LAB — PREGNANCY, URINE: Preg Test, Ur: NEGATIVE

## 2019-07-18 NOTE — Progress Notes (Signed)
Wet mount reviewed, no treatment indicated at this time. Patient left clinic before results reviewed today.Jenetta Downer, RN

## 2019-07-18 NOTE — Progress Notes (Signed)
Patient here for PT and wants STD screening. Jenetta Downer, RN

## 2019-07-18 NOTE — Progress Notes (Signed)
    STI clinic/screening visit  Subjective:  Heather [name] is a 23 y.o.G2P1 female being seen today for an STI screening visit. The patient reports they do have symptoms with irritated vagina x 3 days and white leukorrhea x 3 days with "weird smell".  Patient has the following medical conditions:   Patient Active Problem List   Diagnosis Date Noted  . Gestational hypertension 04/01/2019  . Seizure ([place]) 02/28/2018  . Herpes 05/24/2016  . Acne 12/06/2015  . Family history of breast cancer in female 11/30/2015  . Eczema 01/14/2014     Chief Complaint  Patient presents with  . Exposure to STD    also wants PT    HPI  Patient reports LMP 05/24/19, ex smoker, last coitus 07/08/19 without condom, no birth control and desires none today.  Bottlefeeding baby born 03/30/19  See flowsheet for further details and programmatic requirements.    The following portions of the patient's history were reviewed and updated as appropriate: allergies, current medications, past medical history, past social history, past surgical history and problem list.  Objective:   Vitals:   07/18/19 1056  BP: 122/70  Weight: 190 lb 3.2 oz (86.3 kg)  Height: 5' 5.5" (1.664 m)    Physical Exam Constitutional:      Appearance: Normal appearance.  Abdominal:     Palpations: Abdomen is soft.     Tenderness: There is no abdominal tenderness. There is no guarding.     Hernia: There is no hernia in the left inguinal area or right inguinal area.     Comments: Soft fair tone without tenderness  Genitourinary:    General: Normal vulva.     Exam position: Lithotomy position.     Labia:        Right: No rash or lesion.        Left: No rash or lesion.      Vagina: Vaginal discharge (creamy white, ph<4.5 without malodor) present.     Cervix: Normal.     Uterus: Normal.      Rectum: Normal.  Lymphadenopathy:     Lower Body: No right inguinal adenopathy. No left inguinal adenopathy.  Skin:    General:  Skin is warm and dry.  Neurological:     Mental Status: She is alert.  Psychiatric:        Mood and Affect: Mood normal.        Behavior: Behavior normal.       Assessment and Plan:  Heather [name] is a 23 y.o. female presenting to the [facility] Department for STI screening  1. Screening examination for venereal disease Treat wet mount per standing orders Immunization nurse consult - WET PREP FOR [place], YEAST, CLUE - Pregnancy, urine=neg - Chlamydia/Gonorrhea  Lab     Return if symptoms worsen or fail to improve.  No future appointments.  Herbie Saxon, CNM

## 2019-07-26 NOTE — Addendum Note (Signed)
Addended by: Jenetta Downer on: 07/26/2019 08:48 AM   Modules accepted: Orders

## 2019-08-29 ENCOUNTER — Ambulatory Visit: Payer: Medicaid Other | Admitting: Advanced Practice Midwife

## 2019-08-29 ENCOUNTER — Other Ambulatory Visit: Payer: Self-pay

## 2019-08-29 DIAGNOSIS — Z113 Encounter for screening for infections with a predominantly sexual mode of transmission: Secondary | ICD-10-CM | POA: Diagnosis not present

## 2019-08-29 LAB — WET PREP FOR TRICH, YEAST, CLUE
Trichomonas Exam: NEGATIVE
Yeast Exam: NEGATIVE

## 2019-08-29 NOTE — Progress Notes (Signed)
    STI clinic/screening visit  Subjective:  Heather [name] is a 23 y.o.G2P1 nonsmoker female being seen today for an STI screening visit. The patient reports they do not have symptoms.  Patient has the following medical conditions:   Patient Active Problem List   Diagnosis Date Noted  . Gestational hypertension 04/01/2019  . Seizure ([place]) 02/28/2018  . Herpes 05/24/2016  . Acne 12/06/2015  . Family history of breast cancer in female 11/30/2015  . Eczema 01/14/2014     No chief complaint on file.   HPI  Patient reports no symptoms  See flowsheet for further details and programmatic requirements.    The following portions of the patient's history were reviewed and updated as appropriate: allergies, current medications, past medical history, past social history, past surgical history and problem list.  Objective:  There were no vitals filed for this visit.  Physical Exam Constitutional:      Appearance: Normal appearance.  HENT:     Head: Normocephalic and atraumatic.  Eyes:     Conjunctiva/sclera: Conjunctivae normal.  Neck:     Musculoskeletal: Normal range of motion and neck supple.  Pulmonary:     Effort: Pulmonary effort is normal.     Breath sounds: Normal breath sounds.  Abdominal:     Palpations: Abdomen is soft.     Comments: Soft without tenderness, fair tone  Genitourinary:    General: Normal vulva.     Exam position: Lithotomy position.     Labia:        Right: No lesion.        Left: No lesion.      Vagina: Vaginal discharge (creamy white leukorrhea, ph<4.5) present.     Cervix: No cervical motion tenderness, friability or erythema.     Uterus: Normal.      Adnexa: Right adnexa normal and left adnexa normal.     Rectum: Normal.  Lymphadenopathy:     Lower Body: No right inguinal adenopathy. No left inguinal adenopathy.  Skin:    General: Skin is warm and dry.  Neurological:     Mental Status: She is alert.       Assessment and Plan:   Heather [name] is a 23 y.o. female presenting to the [facility] Department for STI screening  1. Screening examination for venereal disease Treat wet mount per standing orders Immunization nurse consult Last physical 09/2018 - WET PREP FOR [place], YEAST, CLUE - Gonococcus culture - Syphilis Serology, [place] [facility] - HIV [place] [facility] - Chlamydia/Gonorrhea  Lab     No follow-ups on file.  No future appointments.  Herbie Saxon, CNM

## 2019-08-29 NOTE — Progress Notes (Signed)
Wet Prep results reviewed. Per standing orders no treatment indicated. Shahana Capes, RN  

## 2019-08-30 ENCOUNTER — Other Ambulatory Visit: Payer: Self-pay

## 2019-08-30 ENCOUNTER — Encounter: Payer: Self-pay | Admitting: Emergency Medicine

## 2019-08-30 DIAGNOSIS — Z5321 Procedure and treatment not carried out due to patient leaving prior to being seen by health care provider: Secondary | ICD-10-CM | POA: Diagnosis not present

## 2019-08-30 DIAGNOSIS — R2231 Localized swelling, mass and lump, right upper limb: Secondary | ICD-10-CM | POA: Diagnosis present

## 2019-08-30 NOTE — ED Triage Notes (Signed)
Pt reports her fingers swelled and her rings on her right hand are now stuck. Swelling noted. Attempted to remove with baby oil without success.

## 2019-08-31 ENCOUNTER — Emergency Department
Admission: EM | Admit: 2019-08-31 | Discharge: 2019-08-31 | Disposition: A | Discharge status: Home / Self Care | Payer: Medicaid Other | Attending: Emergency Medicine | Admitting: Emergency Medicine

## 2019-08-31 NOTE — ED Notes (Signed)
Pt no longer in lobby. Seen by STAT desk walking out door with visitor.

## 2019-08-31 NOTE — ED Notes (Addendum)
Successfully cut off both rings with manual ring cutter. Pt tolerated well. No obvious injuries to affected finger.

## 2019-09-03 LAB — GONOCOCCUS CULTURE

## 2019-09-05 ENCOUNTER — Telehealth: Payer: Self-pay

## 2019-09-05 NOTE — Telephone Encounter (Signed)
Lab sent for scanning. Danyel Griess, RN  

## 2019-09-09 ENCOUNTER — Ambulatory Visit: Payer: Medicaid Other

## 2019-09-09 ENCOUNTER — Other Ambulatory Visit: Payer: Self-pay

## 2019-09-09 DIAGNOSIS — A749 Chlamydial infection, unspecified: Secondary | ICD-10-CM

## 2019-09-09 MED ORDER — AZITHROMYCIN 500 MG PO TABS
1000.0000 mg | ORAL_TABLET | Freq: Once | ORAL | Status: AC
  Administered 2019-09-09: 1000 mg via ORAL

## 2019-09-09 NOTE — Telephone Encounter (Signed)
TC to patient. Verified ID via password/SS#. Informed of positive chlamydia and need for tx. Instructed to eat before visit and have partner call for tx appt. Appt scheduled for today. Ahmira Boisselle, RN    

## 2019-11-22 ENCOUNTER — Ambulatory Visit: Payer: Medicaid Other

## 2020-01-22 ENCOUNTER — Ambulatory Visit: Payer: Medicaid Other

## 2020-03-18 ENCOUNTER — Ambulatory Visit: Payer: Medicaid Other

## 2020-03-23 ENCOUNTER — Ambulatory Visit: Payer: Medicaid Other

## 2020-04-22 ENCOUNTER — Ambulatory Visit: Payer: Medicaid Other | Admitting: Physician Assistant

## 2020-04-22 ENCOUNTER — Ambulatory Visit (LOCAL_COMMUNITY_HEALTH_CENTER): Payer: Medicaid Other

## 2020-04-22 ENCOUNTER — Other Ambulatory Visit: Payer: Self-pay

## 2020-04-22 VITALS — BP 123/79 | Ht 66.5 in | Wt 153.6 lb

## 2020-04-22 DIAGNOSIS — Z3201 Encounter for pregnancy test, result positive: Secondary | ICD-10-CM

## 2020-04-22 DIAGNOSIS — Z113 Encounter for screening for infections with a predominantly sexual mode of transmission: Secondary | ICD-10-CM

## 2020-04-22 DIAGNOSIS — A5901 Trichomonal vulvovaginitis: Secondary | ICD-10-CM

## 2020-04-22 LAB — WET PREP FOR TRICH, YEAST, CLUE
Trichomonas Exam: POSITIVE — AB
Yeast Exam: NEGATIVE

## 2020-04-22 LAB — PREGNANCY, URINE: Preg Test, Ur: POSITIVE — AB

## 2020-04-22 MED ORDER — PRENATAL VITAMIN 27-0.8 MG PO TABS: 1.0000 | ORAL_TABLET | Freq: Every day | ORAL | 0 refills | Status: DC

## 2020-04-22 MED ORDER — METRONIDAZOLE 500 MG PO TABS: 2000.0000 mg | ORAL_TABLET | Freq: Once | ORAL | 0 refills | Status: AC

## 2020-04-22 NOTE — Progress Notes (Signed)
Wet mount reviewed, patient treated per SO for trich. PT positive, see PT document.Burt Knack, RN

## 2020-04-22 NOTE — Progress Notes (Signed)
Patient here for STD visit and PT visit. See STD visit document for PT order. Patient unsure about continuing pregnancy, states needs time to process. Given PT confirmation and PT packed and PNV. Patient counseled to visit WIC if she proceeds with pregnancy, and given A. Mariana Kaufman card.Burt Knack, RN

## 2020-04-23 ENCOUNTER — Encounter: Payer: Self-pay | Admitting: Physician Assistant

## 2020-04-23 NOTE — Progress Notes (Signed)
[facility] Department STI clinic/screening visit  Subjective:  Heather [name] is a 24 y.o. female being seen today for an STI screening visit. The patient reports they do have symptoms.  Patient reports that they are not sure if they desire a pregnancy in the next year.   They reported they are not interested in discussing contraception today.  No LMP recorded (approximate). Patient is pregnant.   Patient has the following medical conditions:   Patient Active Problem List   Diagnosis Date Noted  . Gestational hypertension 04/01/2019  . Seizure (HCC) 02/28/2018  . Herpes 05/24/2016  . Acne 12/06/2015  . Family history of breast cancer in female 11/30/2015  . Eczema 01/14/2014    Chief Complaint  Patient presents with  . SEXUALLY TRANSMITTED DISEASE    screening    HPI  Patient reports that she has had a thick discharge with odor sometimes off and on for 3 weeks.  Denies other symptoms.  States that she has a history of epilepsy but is not currently taking medicine.  Denies surgeries.  Reports last HIV test was 6 months ago, last pap about 4-5 months ago and LMP 02/28/2020.  Patient requests pregnancy test today.  See flowsheet for further details and programmatic requirements.    The following portions of the patient's history were reviewed and updated as appropriate: allergies, current medications, past medical history, past social history, past surgical history and problem list.  Objective:  There were no vitals filed for this visit.  Physical Exam Constitutional:      General: She is not in acute distress.    Appearance: Normal appearance.  HENT:     Head: Normocephalic and atraumatic.     Comments: No nits, lice, or hair loss. No cervical, supraclavicular or axillary adenopathy.    Mouth/Throat:     Mouth: Mucous membranes are moist.     Pharynx: Oropharynx is clear. No oropharyngeal exudate or posterior oropharyngeal erythema.  Eyes:      Conjunctiva/sclera: Conjunctivae normal.  Pulmonary:     Effort: Pulmonary effort is normal.  Abdominal:     Palpations: Abdomen is soft. There is no mass.     Tenderness: There is no abdominal tenderness. There is no guarding or rebound.  Genitourinary:    General: Normal vulva.     Rectum: Normal.     Comments: External genitalia/pubic area without nits, lice, edema, erythema, lesions and inguinal adenopathy. Vagina with normal mucosa, small amount of thick, white discharge, pH=4.5. Cervix without visible lesions. Uterus firm, mobile, nt, no masses, no CMT, no adnexal tenderness or fullness. Musculoskeletal:     Cervical back: Neck supple. No tenderness.  Skin:    General: Skin is warm and dry.     Findings: No bruising, erythema, lesion or rash.  Neurological:     Mental Status: She is alert and oriented to person, place, and time.  Psychiatric:        Mood and Affect: Mood normal.        Behavior: Behavior normal.        Thought Content: Thought content normal.        Judgment: Judgment normal.      Assessment and Plan:  Heather [name] is a 24 y.o. female presenting to the [facility] Department for STI screening  1. Screening for STD (sexually transmitted disease) Patient into clinic with symptoms.  Declines blood work today. Rec condoms with all sex. Await test results.  Counseled that RN  will call if needs to RTC for further treatment once results are back.  - WET PREP FOR TRICH, YEAST, [place] culture  2. Trichomonal vaginitis Treat for Trich with Metronidazole 2 g po with food, no EtOH for 24 hr before and until 72 hr after taking medicine, No sex for 7 days and until after partner completes treatment. RTC for re-treatment if vomits < 2 hr after taking medicine. - metroNIDAZOLE (FLAGYL) 500 MG tablet; Take 4 tablets (2,000 mg total) by mouth once for 1 dose.  Dispense: 4 tablet; Refill: 0  3.  Pregnancy examination or test, positive result Pregnancy test positive today.  Per LMP, EGA is 7 wk 5 d, with EDC1/28/2022. See PT flow sheet completed by RN. - Pregnancy, urine     No follow-ups on file.  No future appointments.  Jerene Dilling, PA

## 2020-04-25 IMAGING — US US OB COMP LESS 14 WK
1 series · 14 of 28 positions shown · non-contrast
Comparison: None.

CLINICAL DATA: Fall. Abdominal pain. Gestational age by LMP of 9
weeks 3 days. Insert initial

EXAM:
OBSTETRIC <14 WK ULTRASOUND
TECHNIQUE: Transabdominal ultrasound was performed for evaluation of the
gestation as well as the maternal uterus and adnexal regions.

[Series 1: us ob comp less 14 wk · 14 of 54 slices shown]
[im 2/54]
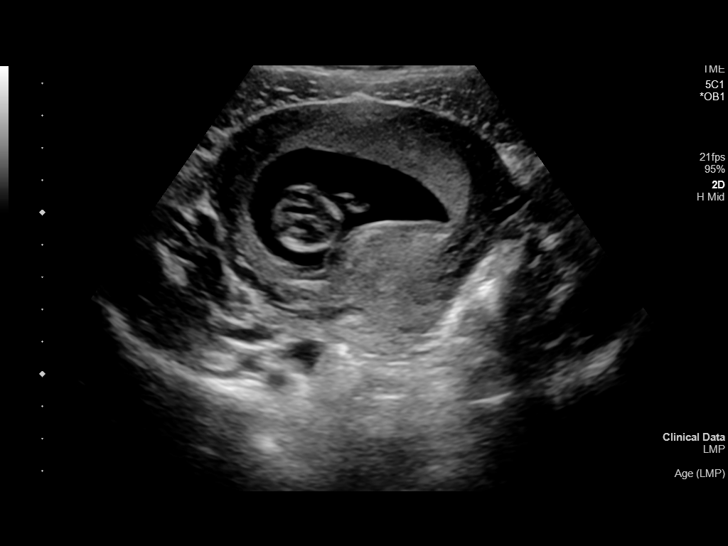
[im 6/54]
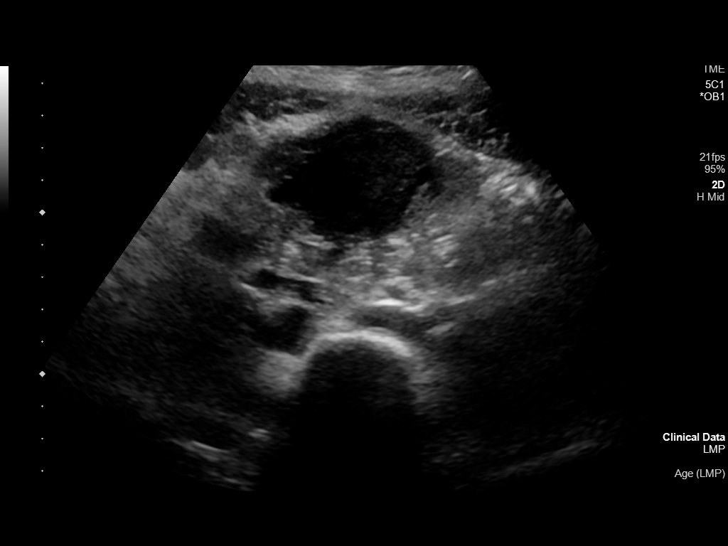
[im 10/54]
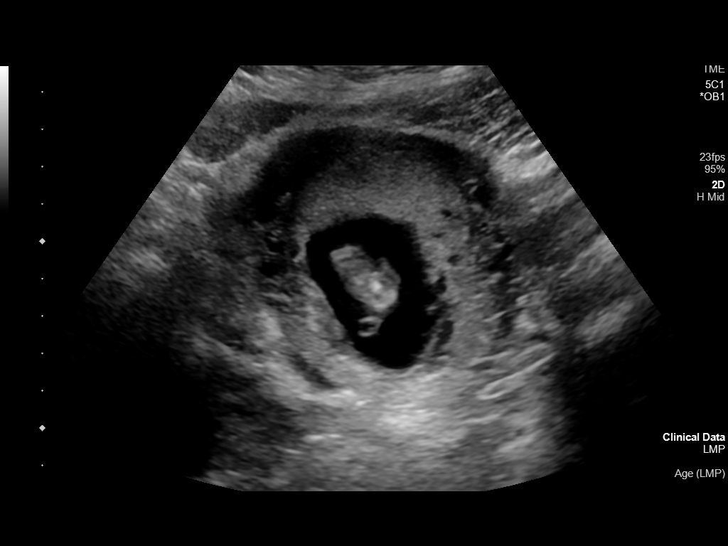
[im 14/54]
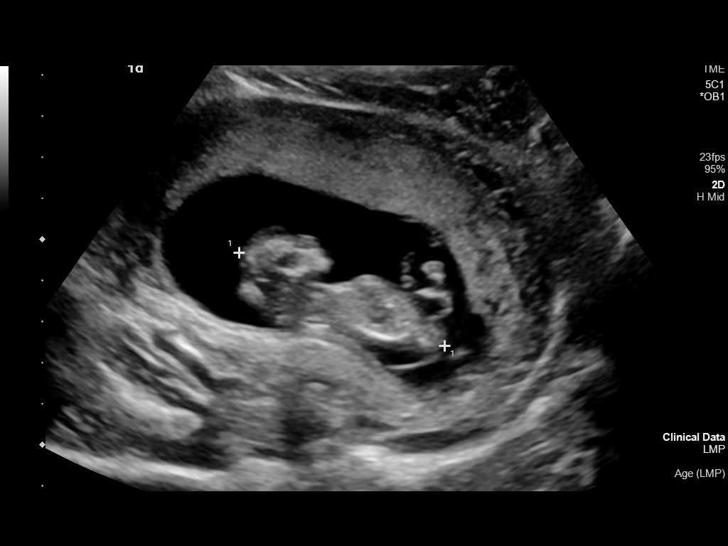
[im 18/54]
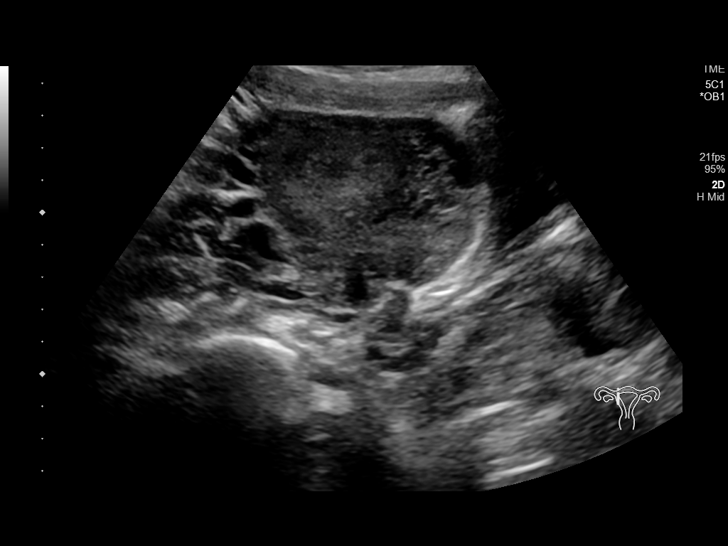
[im 22/54]
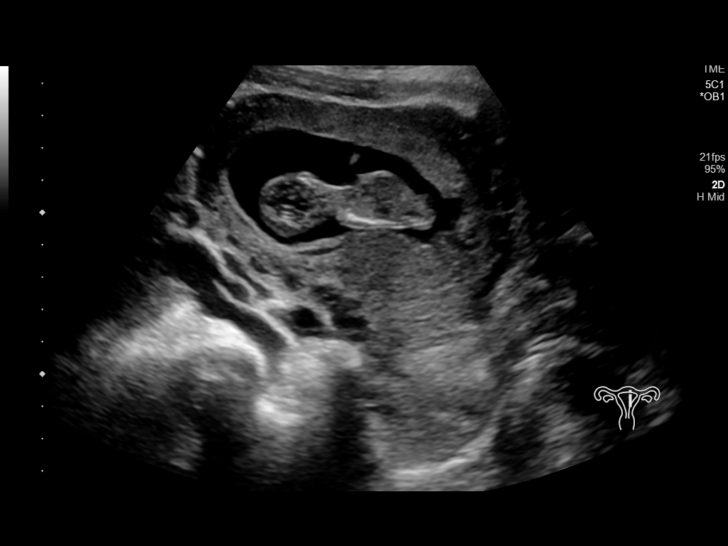
[im 26/54]
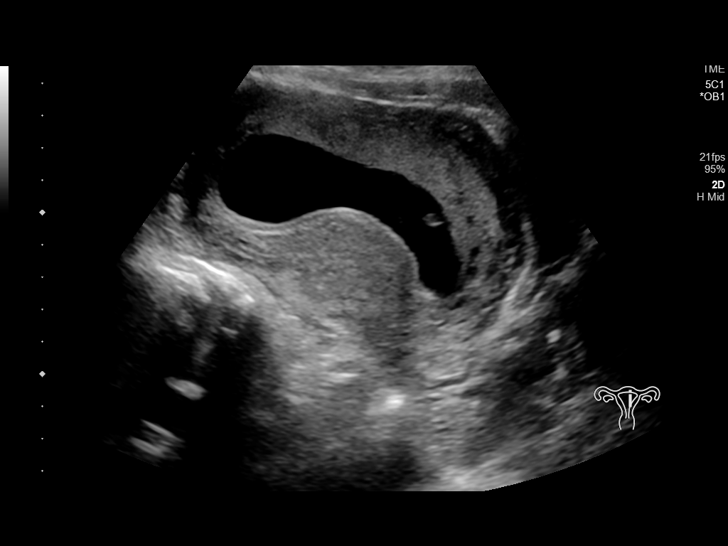
[im 30/54]
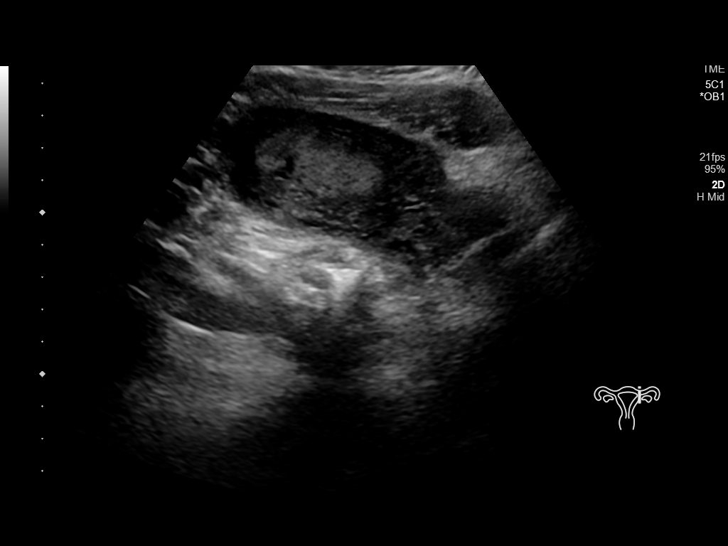
[im 34/54]
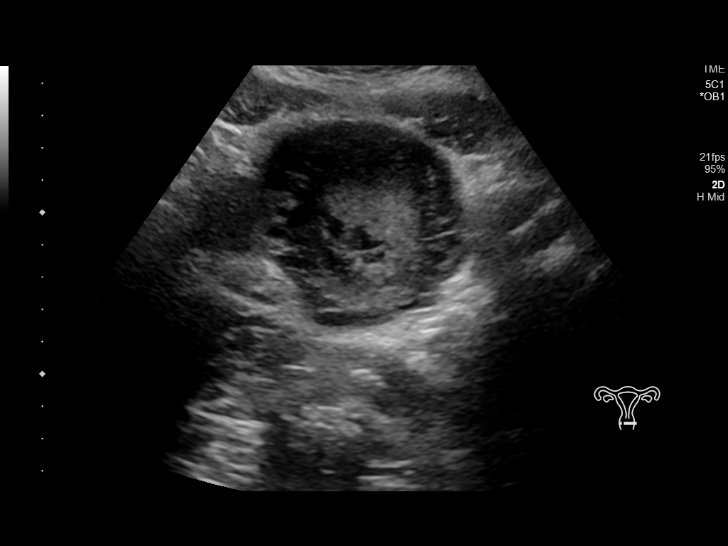
[im 38/54]
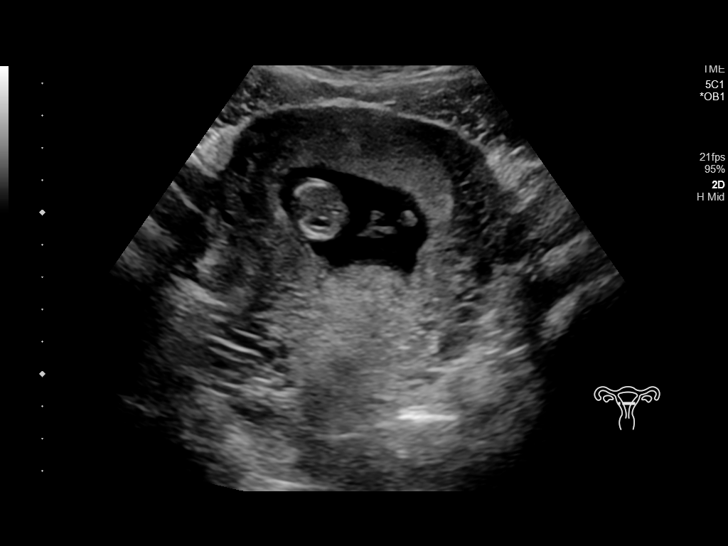
[im 42/54]
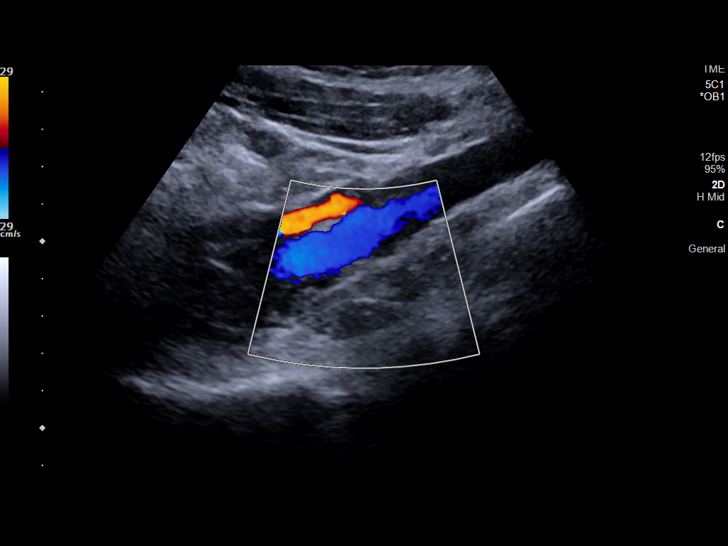
[im 46/54]
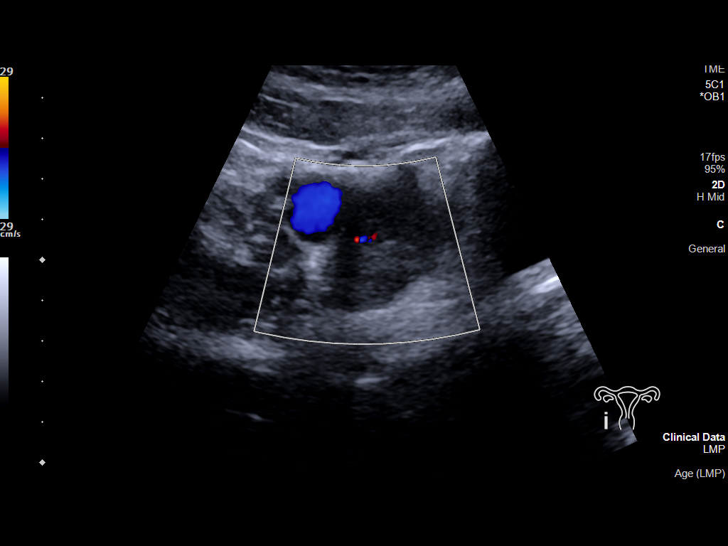
[im 50/54]
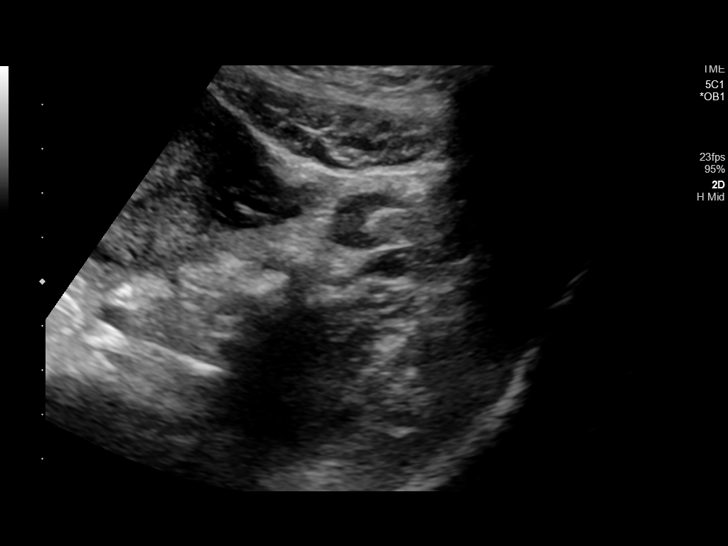
[im 54/54]
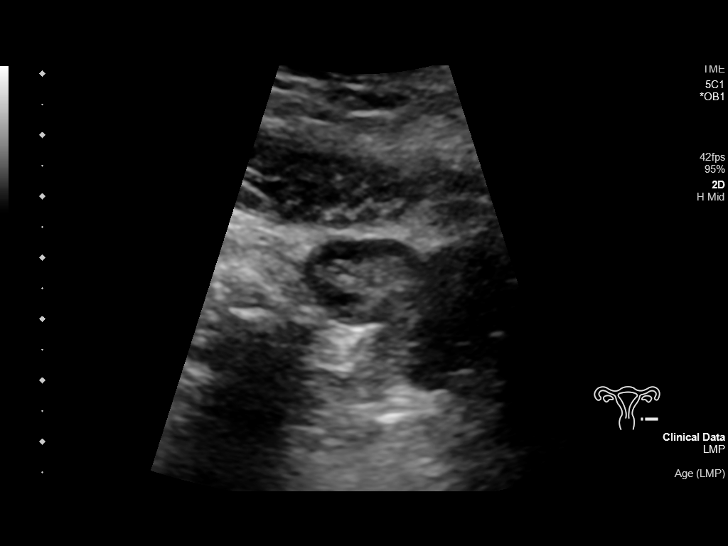

[14 of 28 positions shown; findings below may reference images not displayed]

FINDINGS: Intrauterine gestational sac: Single

Yolk sac:  Visualized.

Embryo:  Visualized.

Cardiac Activity: Visualized.

Heart Rate: 165 bpm

CRL:   56 mm   12 w 1 d                  US EDC: 04/13/2019

Subchorionic hemorrhage:  None visualized.

Maternal uterus/adnexae: Normal appearance of both ovaries. No mass
or abnormal free fluid identified.
IMPRESSION: Single living IUP measuring 12 weeks 1 day, with US EDC of
04/13/2019.

No significant maternal uterine or adnexal abnormality identified.

## 2020-04-28 LAB — GONOCOCCUS CULTURE

## 2020-05-22 ENCOUNTER — Telehealth: Payer: Self-pay | Admitting: Family Medicine

## 2020-05-22 NOTE — Telephone Encounter (Signed)
Phone call to pt. Pt inquiring about abortion pill. Pt to contact abortion providers to get accurate information about how far along a person can be for the abortion pill to work and accurate information about abortion services. Pt offered community resources sheet. Pt plans to contact service providers herself.  Pt will call back if any additional services or information is needed at/by [facility].

## 2020-05-22 NOTE — Telephone Encounter (Signed)
Patient has question about taking a pill

## 2020-05-25 ENCOUNTER — Telehealth: Payer: Self-pay

## 2020-05-25 NOTE — Telephone Encounter (Signed)
Call to client to f/u +PT done 04/22/20; states plan to schedule appt for prenatal care at [facility], denies problems Sharlette Dense, RN

## 2020-06-22 ENCOUNTER — Emergency Department: Payer: Medicaid Other

## 2020-06-22 ENCOUNTER — Encounter: Payer: Medicaid Other | Admitting: Obstetrics

## 2020-06-22 ENCOUNTER — Other Ambulatory Visit: Payer: Self-pay

## 2020-06-22 ENCOUNTER — Emergency Department
Admission: EM | Admit: 2020-06-22 | Discharge: 2020-06-22 | Disposition: A | Discharge status: Home / Self Care | Payer: Medicaid Other | Attending: Emergency Medicine | Admitting: Emergency Medicine

## 2020-06-22 DIAGNOSIS — Z3A16 16 weeks gestation of pregnancy: Secondary | ICD-10-CM | POA: Insufficient documentation

## 2020-06-22 DIAGNOSIS — O26899 Other specified pregnancy related conditions, unspecified trimester: Secondary | ICD-10-CM | POA: Diagnosis present

## 2020-06-22 DIAGNOSIS — R110 Nausea: Secondary | ICD-10-CM | POA: Diagnosis not present

## 2020-06-22 DIAGNOSIS — O99350 Diseases of the nervous system complicating pregnancy, unspecified trimester: Secondary | ICD-10-CM | POA: Insufficient documentation

## 2020-06-22 DIAGNOSIS — R569 Unspecified convulsions: Secondary | ICD-10-CM

## 2020-06-22 DIAGNOSIS — Z87891 Personal history of nicotine dependence: Secondary | ICD-10-CM | POA: Insufficient documentation

## 2020-06-22 LAB — HCG, QUANTITATIVE, PREGNANCY: hCG, Beta Chain, Quant, S: 14700 m[IU]/mL — ABNORMAL HIGH (ref <5)

## 2020-06-22 NOTE — ED Triage Notes (Addendum)
Pt fell yesterday when she had a seizure. States history of seizures. States used to be on keppra but stopped taking it bc it "made her tired". Pt pregnant and states here to have her baby checked out since she fell. Pt thinks she probably hit her head. Pt (mother) A&Ox4. Denies vision changes and dizziness. About [redacted] weeks along per pt.

## 2020-06-22 NOTE — ED Provider Notes (Signed)
[facility] Emergency Department Provider Note ____________________________________________   First MD Initiated Contact with Patient 06/22/20 1251     (approximate)  I have reviewed the triage vital signs and the nursing notes.   HISTORY  Chief Complaint Fall  HPI Heather [name] is a 24 y.o. female with history of seizures and currently about [redacted] weeks pregnant presents to the emergency department for treatment and evaluation of abdominal cramping. She states that she had a seizure yesterday and fell. She has been off of her seizure medications for a few months because she didn't like how it made her feel. Abdominal cramping started today. No vaginal bleeding.         Past Medical History:  Diagnosis Date  . Seizures [facility])    last seizure November 2019    Patient Active Problem List   Diagnosis Date Noted  . Gestational hypertension 04/01/2019  . Seizure (HCC) 02/28/2018  . Herpes 05/24/2016  . Acne 12/06/2015  . Family history of breast cancer in female 11/30/2015  . Eczema 01/14/2014    Past Surgical History:  Procedure Laterality Date  . NO PAST SURGERIES    . NO PAST SURGERIES      Prior to Admission medications   Medication Sig Start Date End Date Taking? Authorizing Provider  Prenatal Vit-Fe Fumarate-FA (PRENATAL VITAMIN) 27-0.8 MG TABS Take 1 tablet by mouth daily. 04/22/20   Federico Flake, MD    Allergies Patient has no known allergies.  Family History  Problem Relation Age of Onset  . Bipolar disorder Mother   . OCD Mother   . Hypertension Father   . Hyperlipidemia Father   . Cancer Maternal Uncle        breast  . Cancer Maternal Grandmother        breast  . Hypertension Paternal Grandmother   . Hyperlipidemia Paternal Grandmother   . COPD Neg Hx   . Diabetes Neg Hx   . Heart disease Neg Hx   . Stroke Neg Hx     Social History Social History   Tobacco Use  . Smoking status: Former Smoker     Types: Cigarettes    Quit date: 07/28/2018    Years since quitting: 1.9  . Smokeless tobacco: Never Used  Vaping Use  . Vaping Use: Never used  Substance Use Topics  . Alcohol use: No    Comment: last drink yesterday, wine  . Drug use: No    Review of Systems  Constitutional: No fever/chills Eyes: No visual changes. ENT: No sore throat. Cardiovascular: Denies chest pain. Respiratory: Denies shortness of breath. Gastrointestinal: Positive for abdominal pain.  Positive for nausea, no vomiting.  No diarrhea.  No constipation. Genitourinary: Negative for dysuria. Musculoskeletal: Negative for back pain. Skin: Negative for rash. Neurological: Negative for headaches, focal weakness or numbness. ____________________________________________   PHYSICAL EXAM:  VITAL SIGNS: ED Triage Vitals  Enc Vitals Group     BP 06/22/20 1234 110/66     Pulse Rate 06/22/20 1234 78     Resp 06/22/20 1234 17     Temp 06/22/20 1234 98 F (36.7 C)     Temp Source 06/22/20 1234 Oral     SpO2 06/22/20 1234 99 %     Weight 06/22/20 1237 150 lb (68 kg)     Height 06/22/20 1237 5\' 5"  (1.651 m)     Head Circumference --      Peak Flow --      Pain Score  06/22/20 1237 7     Pain Loc --      Pain Edu? --      Excl. in GC? --     Constitutional: Alert and oriented. Well appearing and in no acute distress. Eyes: Conjunctivae are normal. PERRL. EOMI. Head: Atraumatic. Nose: No congestion/rhinnorhea. Mouth/Throat: Mucous membranes are moist.  Oropharynx non-erythematous. Neck: No stridor.   Hematological/Lymphatic/Immunilogical: No cervical lymphadenopathy. Cardiovascular: Normal rate, regular rhythm. Grossly normal heart sounds.  Good peripheral circulation. Respiratory: Normal respiratory effort.  No retractions. Lungs CTAB. Gastrointestinal: Soft and nontender. No distention. No abdominal bruits. No CVA tenderness. Genitourinary:  Musculoskeletal: No lower extremity tenderness nor edema.  No  joint effusions. Neurologic:  Normal speech and language. No gross focal neurologic deficits are appreciated. No gait instability. Skin:  Skin is warm, dry and intact. No rash noted. Psychiatric: Mood and affect are normal. Speech and behavior are normal.  ____________________________________________   LABS (all labs ordered are listed, but only abnormal results are displayed)  Labs Reviewed  HCG, QUANTITATIVE, PREGNANCY - Abnormal; Notable for the following components:      Result Value   hCG, Beta Chain, Quant, S 14,700 (*)    All other components within normal limits   ____________________________________________  EKG  Not indicated. ____________________________________________  RADIOLOGY  ED MD interpretation:    US shows single IUP 16 weeks 6 days. FHR 145.  I, Kem Boroughs, personally viewed and evaluated these images (plain radiographs) as part of my medical decision making, as well as reviewing the written report by the radiologist.  Official radiology report(s): US OB Limited  Result Date: 06/22/2020 CLINICAL DATA:  24 year old pregnant female with pelvic pain. EXAM: LIMITED OBSTETRIC ULTRASOUND FINDINGS: Number of Fetuses: 1 Heart Rate:  145 bpm Movement: Detected Presentation: Cephalic Placental Location: Anterior Previa: No Amniotic Fluid (Subjective):  Within normal limits. BPD: 3.5 cm 16 w  6 d MATERNAL FINDINGS: Cervix:  Appears closed.  The cervical length is 3.2 cm Uterus/Adnexae: No abnormality visualized. IMPRESSION: Single live intrauterine pregnancy.  No acute abnormality. This exam is performed on an emergent basis and does not comprehensively evaluate fetal size, dating, or anatomy; follow-up complete OB US should be considered if further fetal assessment is warranted. Electronically Signed   By: Elgie Collard M.D.   On: 06/22/2020 15:18    ____________________________________________   PROCEDURES  Procedure(s) performed (including Critical  Care):  Procedures  ____________________________________________   INITIAL IMPRESSION / ASSESSMENT AND PLAN     24 year old female presenting to the emergency department for treatment and evaluation of abdominal cramping. She is about [redacted] weeks pregnant. See HPI for further details. Plan will be to check HCG and obtain ultrasound. She has not had prenatal care thus far.  DIFFERENTIAL DIAGNOSIS  Abdominal pain in pregnancy, missed abortion  ED COURSE  Ultrasound shows single IUP with FHR 145. She is to call and schedule appointments with neurology and OB/ GYN. She was also advised to start taking prenatal vitamins.     ___________________________________________   FINAL CLINICAL IMPRESSION(S) / ED DIAGNOSES  Final diagnoses:  Seizure (HCC)  [redacted] weeks gestation of pregnancy     ED Discharge Orders    None       Heather [name] was evaluated in Emergency Department on 06/23/2020 for the symptoms described in the history of present illness. She was evaluated in the context of the global COVID-19 pandemic, which necessitated consideration that the patient might be at risk for infection with the  SARS-CoV-2 virus that causes COVID-19. Institutional protocols and algorithms that pertain to the evaluation of patients at risk for COVID-19 are in a state of rapid change based on information released by regulatory bodies including the CDC and federal and state organizations. These policies and algorithms were followed during the patient's care in the ED.   Note:  This document was prepared using Dragon voice recognition software and may include unintentional dictation errors.   Chinita Pester, FNP 06/23/20 0802    Jene Every, MD 06/23/20 1357

## 2020-06-22 NOTE — Discharge Instructions (Signed)
Please take prenatal vitamins daily.  Please make an appointment with OB/GYN and your neurologist.  Return to the ER for symptoms of concern if unable to schedule an appointment.

## 2020-06-22 NOTE — ED Notes (Signed)
Pt reports this cramping is new as she doesn't usually get cramps. Denies vaginal bleeding. Little nauseous per pt. Pt sitting calmly in bed playing with her son. In NAD.

## 2020-06-30 ENCOUNTER — Emergency Department
Admission: EM | Admit: 2020-06-30 | Discharge: 2020-06-30 | Disposition: A | Discharge status: Home / Self Care | Payer: Medicaid Other | Attending: Emergency Medicine | Admitting: Emergency Medicine

## 2020-06-30 ENCOUNTER — Emergency Department: Payer: Medicaid Other

## 2020-06-30 ENCOUNTER — Encounter: Payer: Self-pay | Admitting: Emergency Medicine

## 2020-06-30 ENCOUNTER — Other Ambulatory Visit: Payer: Self-pay

## 2020-06-30 DIAGNOSIS — Z87891 Personal history of nicotine dependence: Secondary | ICD-10-CM | POA: Diagnosis not present

## 2020-06-30 DIAGNOSIS — O132 Gestational [pregnancy-induced] hypertension without significant proteinuria, second trimester: Secondary | ICD-10-CM | POA: Insufficient documentation

## 2020-06-30 DIAGNOSIS — Z3A17 17 weeks gestation of pregnancy: Secondary | ICD-10-CM

## 2020-06-30 DIAGNOSIS — R1032 Left lower quadrant pain: Secondary | ICD-10-CM | POA: Insufficient documentation

## 2020-06-30 DIAGNOSIS — R1031 Right lower quadrant pain: Secondary | ICD-10-CM

## 2020-06-30 DIAGNOSIS — Z79899 Other long term (current) drug therapy: Secondary | ICD-10-CM | POA: Insufficient documentation

## 2020-06-30 DIAGNOSIS — O26892 Other specified pregnancy related conditions, second trimester: Secondary | ICD-10-CM | POA: Insufficient documentation

## 2020-06-30 LAB — URINALYSIS, COMPLETE (UACMP) WITH MICROSCOPIC
Bacteria, UA: NONE SEEN
Bilirubin Urine: NEGATIVE
Glucose, UA: NEGATIVE mg/dL
Hgb urine dipstick: NEGATIVE
Ketones, ur: NEGATIVE mg/dL
Leukocytes,Ua: NEGATIVE
Nitrite: NEGATIVE
Protein, ur: NEGATIVE mg/dL
Specific Gravity, Urine: 1.016 (ref 1.005–1.030)
pH: 7 (ref 5.0–8.0)

## 2020-06-30 MED ORDER — ACETAMINOPHEN 500 MG PO TABS
1000.0000 mg | ORAL_TABLET | Freq: Once | ORAL | Status: AC
  Administered 2020-06-30: 1000 mg via ORAL
  Filled 2020-06-30: qty 2

## 2020-06-30 NOTE — Discharge Instructions (Signed)
For pain at home, you can use Tylenol.  Up to 1000 mg at a time, up to 4 times per day.  Do not take more than milligrams of Tylenol within 24 hours.  Please follow-up with your OB/GYN on that appointment on 8/30.  It would be a good resource to have throughout your pregnancy.  If you develop any vaginal bleeding, worsening pain, fevers or inability to keep any fluids down, please return to the ED.

## 2020-06-30 NOTE — ED Triage Notes (Signed)
Pt continues to talk on phone throughout registration and triage process. Pt reports is about [redacted] weeks pregnant and has been lifting things and has some abd pain that is sharp in nature.

## 2020-06-30 NOTE — ED Provider Notes (Signed)
[facility] Emergency Department Provider Note ____________________________________________   First MD Initiated Contact with Patient 06/30/20 1201     (approximate)  I have reviewed the triage vital signs and the nursing notes.  HISTORY  Chief Complaint No chief complaint on file.   HPI Heather [name] is a 24 y.o. femalewho presents to the ED for evaluation of abdominal pain while pregnant.  Chart review indicates history of seizures, patient reports previously controlled with Keppra but she has discontinued this during this pregnancy..  Was seen in our ED 8 days ago for a fall and abdominal pain.  Ultrasound reassuring at that point and she was discharged home. G2, P1 at about [redacted] weeks gestation.  She reports she has not established with OB/GYN yet for this pregnancy, and she has an upcoming appointment in 6 days to do so.  Has had no prenatal care for this pregnancy, besides the ED visit 8 days ago.    Patient reports having a seizure at home 3 days ago.  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 She reports having lower back pain at baseline, but has been "hurting everywhere" for the past 2 days and developed a sharp RLQ pain yesterday. She reports the RLQ pain is constant, 4/10 intensity, and aching.  She is not taken any medications to help alleviate this pain.  She reports due to having persistent RLQ pain, she presents to the ED for evaluation.  Patient reports p.o. intake at baseline, voiding at baseline, but that she has been constipated for the past few days.  Reports typically getting constipated during pregnancies.  Last BM was 3 days ago.  Denies fevers, dysuria, hematuria, upper abdominal pain or chest pain, shortness of breath, syncope, vomiting or diarrhea.    Past Medical History:  Diagnosis Date  . Seizures [facility])    last seizure November 2019    Patient Active Problem List   Diagnosis Date Noted    . Gestational hypertension 04/01/2019  . Seizure (HCC) 02/28/2018  . Herpes 05/24/2016  . Acne 12/06/2015  . Family history of breast cancer in female 11/30/2015  . Eczema 01/14/2014    Past Surgical History:  Procedure Laterality Date  . NO PAST SURGERIES    . NO PAST SURGERIES      Prior to Admission medications   Medication Sig Start Date End Date Taking? Authorizing Provider  Prenatal Vit-Fe Fumarate-FA (PRENATAL VITAMIN) 27-0.8 MG TABS Take 1 tablet by mouth daily. 04/22/20   Federico Flake, MD    Allergies Patient has no known allergies.  Family History  Problem Relation Age of Onset  . Bipolar disorder Mother   . OCD Mother   . Hypertension Father   . Hyperlipidemia Father   . Cancer Maternal Uncle        breast  . Cancer Maternal Grandmother        breast  . Hypertension Paternal Grandmother   . Hyperlipidemia Paternal Grandmother   . COPD Neg Hx   . Diabetes Neg Hx   . Heart disease Neg Hx   . Stroke Neg Hx     Social History Social History   Tobacco Use  . Smoking status: Former Smoker    Types: Cigarettes    Quit date: 07/28/2018    Years since quitting: 1.9  . Smokeless tobacco: Never Used  Vaping Use  . Vaping Use: Never used  Substance Use Topics  . Alcohol use: No  Comment: last drink yesterday, wine  . Drug use: No    Review of Systems  Constitutional: No fever/chills Eyes: No visual changes. ENT: No sore throat. Cardiovascular: Denies chest pain. Respiratory: Denies shortness of breath. Gastrointestinal: Positive for abdominal pain and constipation.  No nausea, no vomiting.  No diarrhea. Genitourinary: Negative for dysuria. Musculoskeletal: Positive for chronic low back pain. Skin: Negative for rash. Neurological: Negative for headaches, focal weakness or numbness.   ____________________________________________   PHYSICAL EXAM:  VITAL SIGNS: Vitals:   06/30/20 0844 06/30/20 1045  BP: 113/72 114/70  Pulse: 70 72   Resp: 18 18  Temp: 98.2 F (36.8 C) 98.2 F (36.8 C)  SpO2: 100% 100%      Constitutional: Alert and oriented. Well appearing and in no acute distress. Eyes: Conjunctivae are normal. PERRL. EOMI. Head: Atraumatic. Nose: No congestion/rhinnorhea. Mouth/Throat: Mucous membranes are moist.  Oropharynx non-erythematous. Neck: No stridor. No cervical spine tenderness to palpation. Cardiovascular: Normal rate, regular rhythm. Grossly normal heart sounds.  Good peripheral circulation. Respiratory: Normal respiratory effort.  No retractions. Lungs CTAB. Gastrointestinal: Soft , nondistended. No abdominal bruits. No CVA tenderness. Mild and diffuse lower abdominal tenderness to palpation without further localizing or peritoneal features.  Upper abdomen is benign. Musculoskeletal: No lower extremity tenderness nor edema.  No joint effusions. No signs of acute trauma. Neurologic:  Normal speech and language. No gross focal neurologic deficits are appreciated. No gait instability noted. Skin:  Skin is warm, dry and intact. No rash noted. Psychiatric: Mood and affect are normal. Speech and behavior are normal.  ____________________________________________   LABS (all labs ordered are listed, but only abnormal results are displayed)  Labs Reviewed  URINALYSIS, COMPLETE (UACMP) WITH MICROSCOPIC    ____________________________________________  RADIOLOGY  ED MD interpretation: IUP without evidence of abruption  Official radiology report(s): US OB Limited  Result Date: 06/30/2020 CLINICAL DATA:  Right lower quadrant and pelvic pain for 3 days in a pregnant patient. EXAM: LIMITED OBSTETRIC ULTRASOUND FINDINGS: Number of Fetuses: 1 Heart Rate:  143 bpm Movement: Visualized. Presentation: Breech. Placental Location: Anterior. Previa: Negative. Amniotic Fluid (Subjective):  Within normal limits. BPD: 4.0 cm 18 w  1 d MATERNAL FINDINGS: Cervix:  Appears closed. Uterus/Adnexae: No abnormality  visualized. IMPRESSION: Single living intrauterine pregnancy.  No acute abnormality. This exam is performed on an emergent basis and does not comprehensively evaluate fetal size, dating, or anatomy; follow-up complete OB US should be considered if further fetal assessment is warranted. Electronically Signed   By: Drusilla Kanner M.D.   On: 06/30/2020 13:34    ____________________________________________   PROCEDURES and INTERVENTIONS  Procedure(s) performed (including Critical Care):  Procedures  Medications  acetaminophen (TYLENOL) tablet 1,000 mg (1,000 mg Oral Given 06/30/20 1255)    ____________________________________________   MDM / ED COURSE  24 year old woman at [redacted] weeks gestation presents with 2 days RLQ pain, without evidence of acute pathology, and amenable to outpatient management with OB/GYN follow-up.  Normal vital signs on room air.  Reassuring examination with a well-appearing patient who has mild and diffuse lower quadrant tenderness to palpation, without peritoneal features or further localizing features.  No CVA tenderness and examination is otherwise without acute pathology.  Ultrasound demonstrates IUP without evidence of acute abruption or other pathology as the source of her pain.  Patient reports relief after hearing about this ultrasound and declines further work-up.  She has provided [place] with a urine sample, and UA is in process at the time of discharge, but she  declines to wait on this diagnostic test due to her lack of urinary symptoms.  She has a follow-up appointment already scheduled with OB/GYN in 6 days, we discussed outpatient management until then.  We discussed return precautions for the ED, and patient is medically stable for discharge home.  Clinical Course as of Jun 30 1348  Tue Jun 30, 2020  1342 Reassessed after ultrasound and Tylenol ministration.  Patient reports improving pain.  Educated patient on reassuring ultrasound with appropriate IUP.  She  expresses relief.  She has already provided [place] with a urine sample, but is eager to leave.  She reports that "as long as the baby is okay, I am okay."  I advised the patient that we have urine tests pending, but she reports does not going to change what she does.  We discussed following up with OB/GYN on her appointment in 6 days, we discussed outpatient management with Tylenol, and we discussed return precautions for the ED.  She expresses understanding and agreement.   [DS]    Clinical Course User Index [DS] Delton Prairie, MD     ____________________________________________   FINAL CLINICAL IMPRESSION(S) / ED DIAGNOSES  Final diagnoses:  RLQ abdominal pain  [redacted] weeks gestation of pregnancy     ED Discharge Orders    None       Everly Rubalcava Katrinka Blazing   Note:  This document was prepared using Dragon voice recognition software and may include unintentional dictation errors.   Delton Prairie, MD 06/30/20 1352

## 2020-06-30 NOTE — ED Notes (Signed)
This RN in to discharge patient; room found to be empty with no signs of patient return.  Did not receive last set of vitals and electronic signature due to this reason.

## 2020-07-06 ENCOUNTER — Other Ambulatory Visit (HOSPITAL_COMMUNITY)
Admission: RE | Admit: 2020-07-06 | Discharge: 2020-07-06 | Disposition: A | Discharge status: Home / Self Care | Payer: Medicaid Other | Source: Ambulatory Visit | Attending: Obstetrics | Admitting: Obstetrics

## 2020-07-06 ENCOUNTER — Other Ambulatory Visit: Payer: Self-pay

## 2020-07-06 ENCOUNTER — Ambulatory Visit (INDEPENDENT_AMBULATORY_CARE_PROVIDER_SITE_OTHER): Payer: Medicaid Other | Admitting: Obstetrics

## 2020-07-06 ENCOUNTER — Encounter: Payer: Self-pay | Admitting: Obstetrics

## 2020-07-06 VITALS — BP 110/60 | Wt 157.0 lb

## 2020-07-06 DIAGNOSIS — Z3A18 18 weeks gestation of pregnancy: Secondary | ICD-10-CM

## 2020-07-06 DIAGNOSIS — Z3480 Encounter for supervision of other normal pregnancy, unspecified trimester: Secondary | ICD-10-CM

## 2020-07-06 DIAGNOSIS — O0932 Supervision of pregnancy with insufficient antenatal care, second trimester: Secondary | ICD-10-CM

## 2020-07-06 DIAGNOSIS — O0930 Supervision of pregnancy with insufficient antenatal care, unspecified trimester: Secondary | ICD-10-CM

## 2020-07-06 LAB — POCT URINALYSIS DIPSTICK OB
Glucose, UA: NEGATIVE
POC,PROTEIN,UA: NEGATIVE

## 2020-07-06 LAB — OB RESULTS CONSOLE GC/CHLAMYDIA: Gonorrhea: NEGATIVE

## 2020-07-06 NOTE — Progress Notes (Signed)
New Obstetric Patient H&P    Chief Complaint: "Desires prenatal care"   History of Present Illness: Patient is a 24 y.o. G3P1011 Not Hispanic or Latino female, LMP 02/28/2020 presents with amenorrhea and positive home pregnancy test. Based on her  LMP, her EDD is Estimated Date of Delivery: 12/04/20 and her EGA is [redacted]w[redacted]d. Cycles are 3. days, irregular, and occur approximately every : not applicable days. Her last pap smear was 0 years ago and was not done due to her age..    She had a urine pregnancy test which was positive about 2 month(s)  ago. Her last menstrual period was normal and lasted for  a few day(s). Since her LMP she claims she has experienced fatigue, nausea (now resolved) and fetal movement. She denies vaginal bleeding. Her past medical history is noncontributory. Her prior pregnancies are notable for none  Since her LMP, she admits to the use of tobacco products  Yes- smokes 3 cigs daily- trying to cut back She claims she has gained   no pounds since the start of her pregnancy.  There are cats in the home in the home  No }She admits close contact with children on a regular basis  yes  She has had chicken pox in the past unknown She has had Tuberculosis exposures, symptoms, or previously tested positive for TB   no Current or past history of domestic violence. no  Genetic Screening/Teratology Counseling: (Includes patient, baby's father, or anyone in either family with:)   1. Patient's age >/= 24 [facility]  no 2. Thalassemia ([place], [place], Mediterranean, or Asian background): MCV<80  no 3. Neural tube defect (meningomyelocele, spina bifida, anencephaly)  no 4. Congenital heart defect  no  5. Down syndrome  no 6. Tay-Sachs (Jewish, [place]  no 7. Canavan's Disease  no 8. Sickle cell disease or trait (African)  no  9. Hemophilia or other blood disorders  no  10. Muscular dystrophy  no  11. Cystic fibrosis  no  12. Huntington's Chorea  no  13. Mental  retardation/autism  no 14. Other inherited genetic or chromosomal disorder  no 15. Maternal metabolic disorder (DM, PKU, etc)  no 16. Patient or FOB with a child with a birth defect not listed above no  16a. Patient or FOB with a birth defect themselves no 17. Recurrent pregnancy loss, or stillbirth  no  18. Any medications since LMP other than prenatal vitamins (include vitamins, supplements, OTC meds, drugs, alcohol)  no 19. Any other genetic/environmental exposure to discuss  no  Infection History:   1. Lives with someone with TB or TB exposed  no  2. Patient or partner has history of genital herpes  no 3. Rash or viral illness since LMP  no 4. History of STI (GC, CT, HPV, syphilis, HIV)  Yes Chlamydia and trich 5. History of recent travel :  no  Other pertinent information:  no     Review of Systems:10 point review of systems negative unless otherwise noted in HPI  Past Medical History:  Past Medical History:  Diagnosis Date  . Seizures (HCC)    last seizure November 2019    Past Surgical History:  Past Surgical History:  Procedure Laterality Date  . NO PAST SURGERIES    . NO PAST SURGERIES      Gynecologic History: Patient's last menstrual period was 02/28/2020 (approximate).  Obstetric History: G3P1011  Family History:  Family History  Problem Relation Age of  Onset  . Bipolar disorder Mother   . OCD Mother   . Hypertension Father   . Hyperlipidemia Father   . Cancer Maternal Uncle        breast  . Cancer Maternal Grandmother        breast  . Hypertension Paternal Grandmother   . Hyperlipidemia Paternal Grandmother   . COPD Neg Hx   . Diabetes Neg Hx   . Heart disease Neg Hx   . Stroke Neg Hx     Social History:  Social History   Socioeconomic History  . Marital status: Single    Spouse name: Not on file  . Number of children: 1  . Years of education: 61  . Highest education level: High school graduate  Occupational History  . Occupation:  RETAIL Investment banker, corporate: [id number]    Comment: FULL TIME  Tobacco Use  . Smoking status: Former Smoker    Types: Cigarettes    Quit date: 07/28/2018    Years since quitting: 1.9  . Smokeless tobacco: Never Used  Vaping Use  . Vaping Use: Never used  Substance and Sexual Activity  . Alcohol use: No  . Drug use: No  . Sexual activity: Yes    Birth control/protection: None  Other Topics Concern  . Not on file  Social History Narrative  . Not on file   Social Determinants of Health   Financial Resource Strain:   . Difficulty of Paying Living Expenses: Not on file  Food Insecurity:   . Worried About Programme researcher, broadcasting/film/video in the Last Year: Not on file  . Ran Out of Food in the Last Year: Not on file  Transportation Needs:   . Lack of Transportation (Medical): Not on file  . Lack of Transportation (Non-Medical): Not on file  Physical Activity:   . Days of Exercise per Week: Not on file  . Minutes of Exercise per Session: Not on file  Stress:   . Feeling of Stress : Not on file  Social Connections:   . Frequency of Communication with Friends and Family: Not on file  . Frequency of Social Gatherings with Friends and Family: Not on file  . Attends Religious Services: Not on file  . Active Member of Clubs or Organizations: Not on file  . Attends Banker Meetings: Not on file  . Marital Status: Not on file  Intimate Partner Violence: Not At Risk  . Fear of Current or Ex-Partner: No  . Emotionally Abused: No  . Physically Abused: No  . Sexually Abused: No    Allergies:  No Known Allergies  Medications: Prior to Admission medications   Medication Sig Start Date End Date Taking? Authorizing Provider  Prenatal Vit-Fe Fumarate-FA (PRENATAL VITAMIN) 27-0.8 MG TABS Take 1 tablet by mouth daily. 04/22/20  Yes Federico Flake, MD    Physical Exam Vitals: Blood pressure 110/60, weight 157 lb (71.2 kg), last menstrual period 02/28/2020, not currently  breastfeeding.  General: NAD HEENT: normocephalic, anicteric Thyroid: no enlargement, no palpable nodules Pulmonary: No increased work of breathing, CTAB Cardiovascular: RRR, distal pulses 2+ Abdomen: NABS, soft, non-tender, non-distended.  Umbilicus without lesions.  No hepatomegaly, splenomegaly or masses palpable. No evidence of hernia  Genitourinary:  External: Normal external female genitalia.  Normal urethral meatus, normal  Bartholin's and Skene's glands.    Vagina: Normal vaginal mucosa, no evidence of prolapse.    Cervix: Grossly normal in appearance, no bleeding  Uterus: enlarged, mobile, normal  contour.  No CMT  + FHTS via doppler  Adnexa: ovaries non-enlarged, no adnexal masses  Rectal: deferred Extremities: no edema, erythema, or tenderness Neurologic: Grossly intact Psychiatric: mood appropriate, affect full   Assessment: 24 y.o. G3P1011 at [redacted]w[redacted]d presenting to initiate prenatal care  Plan: 1) Avoid alcoholic beverages. 2) Patient encouraged not to smoke.  3) Discontinue the use of all non-medicinal drugs and chemicals.  4) Take prenatal vitamins daily.  5) Nutrition, food safety (fish, cheese advisories, and high nitrite foods) and exercise discussed. 6) Hospital and practice style discussed with cross coverage system.  7) Genetic Screening, such as with 1st Trimester Screening, cell free fetal DNA, AFP testing, and Ultrasound, as well as with amniocentesis and CVS as appropriate, is discussed with patient. At the conclusion of today's visit patient requested genetic testing- Inheritest and Maternt 8) Patient is asked about travel to areas at risk for the [place] virus, and counseled to avoid travel and exposure to mosquitoes or sexual partners who may have themselves been exposed to the virus. Testing is discussed, and will be ordered as appropriate.  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 She is due for a visit with her neurologist and will be in contact  with him. She is aware that she late to care. RTC in one week for anatomy /dating scan, labwork and ROB Mirna Mires, [place]  07/06/2020 6:24 PM

## 2020-07-06 NOTE — Progress Notes (Signed)
C/O lower pelvic pain - had u/s done in [facility] ED, was told baby okay, couldn't tell her why she was having pain; adv may take 2 e.s. tylenol q6h while awake and may apply heat qh while awake. rj

## 2020-07-08 LAB — CYTOLOGY - PAP
Chlamydia: NEGATIVE
Comment: NEGATIVE
Comment: NEGATIVE
Comment: NORMAL
Diagnosis: NEGATIVE
Neisseria Gonorrhea: NEGATIVE
Trichomonas: NEGATIVE

## 2020-07-14 ENCOUNTER — Other Ambulatory Visit: Payer: Self-pay | Admitting: Obstetrics

## 2020-07-14 DIAGNOSIS — Z3480 Encounter for supervision of other normal pregnancy, unspecified trimester: Secondary | ICD-10-CM

## 2020-07-14 DIAGNOSIS — O0932 Supervision of pregnancy with insufficient antenatal care, second trimester: Secondary | ICD-10-CM

## 2020-07-14 DIAGNOSIS — B3731 Acute candidiasis of vulva and vagina: Secondary | ICD-10-CM

## 2020-07-14 MED ORDER — TERCONAZOLE 0.4 % VA CREA: 1.0000 | TOPICAL_CREAM | Freq: Every day | VAGINAL | 0 refills | Status: DC

## 2020-07-14 NOTE — Progress Notes (Unsigned)
Pap smear result indicates presence of yeast. Rx for Terazol sent to her pharmacy. Mirna Mires, CNM  07/14/2020 10:38 AM

## 2020-07-15 ENCOUNTER — Encounter: Payer: Self-pay | Admitting: Obstetrics

## 2020-07-15 DIAGNOSIS — F1290 Cannabis use, unspecified, uncomplicated: Secondary | ICD-10-CM | POA: Insufficient documentation

## 2020-07-15 LAB — DRUG SCREEN 12+ALCOHOL+CRT, UR
Amphetamines, Urine: NEGATIVE ng/mL
BENZODIAZ UR QL: NEGATIVE ng/mL
Barbiturate: NEGATIVE ng/mL
Cannabinoids: POSITIVE — AB
Cocaine (Metabolite): NEGATIVE ng/mL
Creatinine, Urine: 157 mg/dL (ref 20.0–300.0)
Ethanol, Urine: NEGATIVE %
Meperidine: NEGATIVE ng/mL
Methadone: NEGATIVE ng/mL
OPIATE SCREEN URINE: NEGATIVE ng/mL
Oxycodone/Oxymorphone, Urine: NEGATIVE ng/mL
Phencyclidine: NEGATIVE ng/mL
Propoxyphene: NEGATIVE ng/mL
Tramadol: NEGATIVE ng/mL

## 2020-07-15 LAB — URINE CULTURE

## 2020-07-21 ENCOUNTER — Ambulatory Visit (INDEPENDENT_AMBULATORY_CARE_PROVIDER_SITE_OTHER): Payer: Medicaid Other

## 2020-07-21 ENCOUNTER — Ambulatory Visit (INDEPENDENT_AMBULATORY_CARE_PROVIDER_SITE_OTHER): Payer: Medicaid Other | Admitting: Obstetrics

## 2020-07-21 ENCOUNTER — Other Ambulatory Visit: Payer: Self-pay

## 2020-07-21 VITALS — BP 108/56 | Wt 164.0 lb

## 2020-07-21 DIAGNOSIS — Z3A20 20 weeks gestation of pregnancy: Secondary | ICD-10-CM

## 2020-07-21 DIAGNOSIS — Z3482 Encounter for supervision of other normal pregnancy, second trimester: Secondary | ICD-10-CM

## 2020-07-21 DIAGNOSIS — Z3480 Encounter for supervision of other normal pregnancy, unspecified trimester: Secondary | ICD-10-CM

## 2020-07-21 DIAGNOSIS — Z3A18 18 weeks gestation of pregnancy: Secondary | ICD-10-CM

## 2020-07-21 NOTE — Progress Notes (Signed)
  Routine Prenatal Care Visit  Subjective  Heather [name] is a 24 y.o. G3P1011 at [redacted]w[redacted]d being seen today for ongoing prenatal care.  She is currently monitored for the following issues for this high-risk pregnancy and has Family history of breast cancer in female; Acne; Seizure (HCC); Gestational hypertension; Herpes; Eczema; Supervision of other normal pregnancy, antepartum; Late prenatal care; and Marijuana user on their problem list.  ----------------------------------------------------------------------------------- Patient reports no complaints.    .  .   Pincus Large Fluid denies.  ----------------------------------------------------------------------------------- The following portions of the patient's history were reviewed and updated as appropriate: allergies, current medications, past family history, past medical history, past social history, past surgical history and problem list. Problem list updated.  Objective  Blood pressure (!) 108/56, weight 164 lb (74.4 kg), last menstrual period 02/28/2020, not currently breastfeeding. Pregravid weight 157 lb (71.2 kg) Total Weight Gain 7 lb (3.175 kg) Urinalysis: Urine Protein    Urine Glucose    Fetal Status:           General:  Alert, oriented and cooperative. Patient is in no acute distress.  Skin: Skin is warm and dry. No rash noted.   Cardiovascular: Normal heart rate noted  Respiratory: Normal respiratory effort, no problems with respiration noted  Abdomen: Soft, gravid, appropriate for gestational age.       Pelvic:  Cervical exam deferred        Extremities: Normal range of motion.     Mental Status: Normal mood and affect. Normal behavior. Normal judgment and thought content.   Assessment   24 y.o. G3P1011 at [redacted]w[redacted]d by  12/04/2020, by Last Menstrual Period presenting for routine prenatal visit  Plan   THIRD Problems (from 07/06/20 to present)    No problems associated with this episode.       Preterm labor symptoms  and general obstetric precautions including but not limited to vaginal bleeding, contractions, leaking of fluid and fetal movement were reviewed in detail with the patient. Please refer to After Visit Summary for other counseling recommendations.  We reviewed her ultrasound- it is a boy. Likely named "Roman". Discussed the import of NOT using MJ during pregnancy. Inheritest and MaternT testing today.  Return in about 4 weeks (around 08/18/2020) for return OB.  Mirna Mires, CNM  07/21/2020 2:19 PM

## 2020-07-21 NOTE — Progress Notes (Signed)
ROB Anatomy scan today 

## 2020-07-26 LAB — MATERNIT 21 PLUS CORE, BLOOD
Fetal Fraction: 8
Result (T21): NEGATIVE
Trisomy 13 (Patau syndrome): NEGATIVE
Trisomy 18 (Edwards syndrome): NEGATIVE
Trisomy 21 (Down syndrome): NEGATIVE

## 2020-08-04 LAB — INHERITEST CORE(CF97,SMA,FRAX)

## 2020-08-06 ENCOUNTER — Other Ambulatory Visit: Payer: Self-pay | Admitting: Obstetrics

## 2020-08-06 DIAGNOSIS — Z3480 Encounter for supervision of other normal pregnancy, unspecified trimester: Secondary | ICD-10-CM

## 2020-08-06 NOTE — Progress Notes (Unsigned)
Referral genetics

## 2020-08-18 ENCOUNTER — Ambulatory Visit (INDEPENDENT_AMBULATORY_CARE_PROVIDER_SITE_OTHER): Payer: Medicaid Other | Admitting: Obstetrics

## 2020-08-18 ENCOUNTER — Other Ambulatory Visit: Payer: Self-pay

## 2020-08-18 VITALS — BP 120/70 | Wt 179.0 lb

## 2020-08-18 DIAGNOSIS — Z3A24 24 weeks gestation of pregnancy: Secondary | ICD-10-CM

## 2020-08-18 DIAGNOSIS — Z3480 Encounter for supervision of other normal pregnancy, unspecified trimester: Secondary | ICD-10-CM

## 2020-08-18 LAB — OB RESULTS CONSOLE VARICELLA ZOSTER ANTIBODY, IGG: Varicella: NON-IMMUNE/NOT IMMUNE

## 2020-08-18 NOTE — Progress Notes (Signed)
No concerns.rj 

## 2020-08-18 NOTE — Progress Notes (Signed)
  Routine Prenatal Care Visit  Subjective  Heather [name] is a 24 y.o. G3P1011 at [redacted]w[redacted]d being seen today for ongoing prenatal care.  She is currently monitored for the following issues for this high-risk pregnancy and has Family history of breast cancer in female; Acne; Seizure (HCC); Gestational hypertension; Herpes; Eczema; Supervision of other normal pregnancy, antepartum; Late prenatal care; and Marijuana user on their problem list.  ----------------------------------------------------------------------------------- Patient reports no bleeding, no contractions, no cramping, no leaking and she has never had her OB panel drawn. Hr Medicaid was out of network, but has now changed to in network..    .  .   Pincus Large Fluid denies.  ----------------------------------------------------------------------------------- The following portions of the patient's history were reviewed and updated as appropriate: allergies, current medications, past family history, past medical history, past social history, past surgical history and problem list. Problem list updated.  Objective  Blood pressure 120/70, weight 179 lb (81.2 kg), last menstrual period 02/28/2020, not currently breastfeeding. Pregravid weight 157 lb (71.2 kg) Total Weight Gain 22 lb (9.979 kg) Urinalysis: Urine Protein    Urine Glucose    Fetal Status:           General:  Alert, oriented and cooperative. Patient is in no acute distress.  Skin: Skin is warm and dry. No rash noted.   Cardiovascular: Normal heart rate noted  Respiratory: Normal respiratory effort, no problems with respiration noted  Abdomen: Soft, gravid, appropriate for gestational age.       Pelvic:  Cervical exam deferred        Extremities: Normal range of motion.     Mental Status: Normal mood and affect. Normal behavior. Normal judgment and thought content.   Assessment   24 y.o. G3P1011 at [redacted]w[redacted]d by  12/04/2020, by Last Menstrual Period presenting for routine  prenatal visit  Plan   THIRD Problems (from 07/06/20 to present)    No problems associated with this episode.       Preterm labor symptoms and general obstetric precautions including but not limited to vaginal bleeding, contractions, leaking of fluid and fetal movement were reviewed in detail with the patient. Please refer to After Visit Summary for other counseling recommendations.  She was late to care and missed her ordered NOB panel. This is drawn today.  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 Will refer again for MFM.  Return in about 2 weeks (around 09/01/2020) for return OB.  Mirna Mires, CNM  08/18/2020 3:48 PM

## 2020-08-20 LAB — RPR+RH+ABO+RUB AB+AB SCR+CB...
Antibody Screen: NEGATIVE
HIV Screen 4th Generation wRfx: NONREACTIVE
Hematocrit: 32.9 % — ABNORMAL LOW (ref 34.0–46.6)
Hemoglobin: 11.2 g/dL (ref 11.1–15.9)
Hepatitis B Surface Ag: NEGATIVE
MCH: 31.5 pg (ref 26.6–33.0)
MCHC: 34 g/dL (ref 31.5–35.7)
MCV: 92 fL (ref 79–97)
Platelets: 284 10*3/uL (ref 150–450)
RBC: 3.56 x10E6/uL — ABNORMAL LOW (ref 3.77–5.28)
RDW: 12 % (ref 11.7–15.4)
RPR Ser Ql: NONREACTIVE
Rh Factor: POSITIVE
Rubella Antibodies, IGG: <0.9 index — ABNORMAL LOW (ref >0.99)
Varicella zoster IgG: 149 index — ABNORMAL LOW (ref >165)
WBC: 11.4 10*3/uL — ABNORMAL HIGH (ref 3.4–10.8)

## 2020-09-15 ENCOUNTER — Other Ambulatory Visit: Payer: Self-pay

## 2020-09-15 ENCOUNTER — Ambulatory Visit (INDEPENDENT_AMBULATORY_CARE_PROVIDER_SITE_OTHER): Payer: Medicaid Other | Admitting: Obstetrics

## 2020-09-15 ENCOUNTER — Other Ambulatory Visit: Payer: Medicaid Other

## 2020-09-15 VITALS — BP 110/70 | Wt 193.4 lb

## 2020-09-15 DIAGNOSIS — Z3A18 18 weeks gestation of pregnancy: Secondary | ICD-10-CM

## 2020-09-15 DIAGNOSIS — Z3480 Encounter for supervision of other normal pregnancy, unspecified trimester: Secondary | ICD-10-CM

## 2020-09-15 DIAGNOSIS — Z23 Encounter for immunization: Secondary | ICD-10-CM

## 2020-09-15 DIAGNOSIS — Z3A28 28 weeks gestation of pregnancy: Secondary | ICD-10-CM

## 2020-09-15 NOTE — Progress Notes (Signed)
Routine Prenatal Care Visit  Subjective  Heather [name] is a 24 y.o. G3P1011 at [redacted]w[redacted]d being seen today for ongoing prenatal care.  She is currently monitored for the following issues for this high-risk pregnancy and has Family history of breast cancer in female; Acne; Seizure (HCC); Gestational hypertension; Herpes; Eczema; Supervision of other normal pregnancy, antepartum; Late prenatal care; and Marijuana user on their problem list.  ----------------------------------------------------------------------------------- Patient reports no complaints.   Contractions: Not present. Vag. Bleeding: None.  Movement: Present. Leaking Fluid denies.  ----------------------------------------------------------------------------------- The following portions of the patient's history were reviewed and updated as appropriate: allergies, current medications, past family history, past medical history, past social history, past surgical history and problem list. Problem list updated.  Objective  Blood pressure 110/70, weight 193 lb 6.4 oz (87.7 kg), last menstrual period 02/28/2020, not currently breastfeeding. Pregravid weight 157 lb (71.2 kg) Total Weight Gain 36 lb 6.4 oz (16.5 kg) Urinalysis: Urine Protein    Urine Glucose    Fetal Status:     Movement: Present     General:  Alert, oriented and cooperative. Patient is in no acute distress.  Skin: Skin is warm and dry. No rash noted.   Cardiovascular: Normal heart rate noted  Respiratory: Normal respiratory effort, no problems with respiration noted  Abdomen: Soft, gravid, appropriate for gestational age. Pain/Pressure: Absent     Pelvic:  Cervical exam deferred        Extremities: Normal range of motion.     Mental Status: Normal mood and affect. Normal behavior. Normal judgment and thought content.   Assessment   24 y.o. G3P1011 at [redacted]w[redacted]d by  12/04/2020, by Last Menstrual Period presenting for routine prenatal visit  Plan   THIRD Problems  (from 07/06/20 to present)    Problem Noted Resolved   Supervision of other normal pregnancy, antepartum 07/06/2020 by Mirna Mires, CNM No   Overview Addendum 09/15/2020  3:27 PM by Mirna Mires, CNM     Clinic  Prenatal Labs  Dating  Blood type:   )+  Genetic Screen 1 Screen:    AFP:     Quad:     NIPS:Maternt neg, female Antibody: negative  Anatomic [place]  Rubella:  non immune  GTT Early:               Third trimester: 09/15/20 RPR:   NR  Flu vaccine  HBsAg:   negative  TDaP vaccine                                               Rhogam: HIV:   NR  Baby Food                                               GBS: (For PCN allergy, check sensitivities)  Contraception  Pap: NILM  Circumcision    Pediatrician    Support Person              Previous Version       Preterm labor symptoms and general obstetric precautions including but not limited to vaginal bleeding, contractions, leaking of fluid and fetal movement were reviewed in detail with the patient. Please refer to After Visit Summary for other counseling recommendations.  28 week labs drawn today. tdap given today. Return in about 2 weeks (around 09/29/2020) for return OB.  Mirna Mires, CNM  09/15/2020 3:28 PM

## 2020-09-15 NOTE — Addendum Note (Signed)
Addended by: Mirna Mires on: 09/15/2020 04:00 PM   Modules accepted: Orders

## 2020-09-15 NOTE — Addendum Note (Signed)
Addended by: Clement Husbands A on: 09/15/2020 04:06 PM   Modules accepted: Orders

## 2020-09-16 LAB — GLUCOSE TOLERANCE, 1 HOUR: Glucose, 1Hr PP: 76 mg/dL (ref 65–199)

## 2020-09-18 ENCOUNTER — Telehealth: Payer: Self-pay

## 2020-09-18 NOTE — Telephone Encounter (Signed)
[facility] calling regarding 09/15/20 DOS; they have record of having test previously and now has a new order from the 9th.  Is this a repeat test or new pregnancy?  Let then know asap.  [phone] opt 4

## 2020-09-21 NOTE — Telephone Encounter (Signed)
Per [facility], 09/15/20 requisitions for MaterniT 21 and Inheritests faxed to [phone] attn Yolanda to cancel orders.

## 2020-09-21 NOTE — Telephone Encounter (Signed)
Hi. For the record, I called [facility] and cnacelled both tests. The two reps who helped me thought that they could alert the lab in time.

## 2020-09-26 LAB — MATERNIT 21 PLUS CORE, BLOOD

## 2020-09-29 ENCOUNTER — Ambulatory Visit (INDEPENDENT_AMBULATORY_CARE_PROVIDER_SITE_OTHER): Payer: Medicaid Other | Admitting: Obstetrics

## 2020-09-29 ENCOUNTER — Other Ambulatory Visit: Payer: Self-pay

## 2020-09-29 VITALS — BP 100/68 | Wt 200.0 lb

## 2020-09-29 DIAGNOSIS — Z3480 Encounter for supervision of other normal pregnancy, unspecified trimester: Secondary | ICD-10-CM

## 2020-09-29 DIAGNOSIS — Z3A30 30 weeks gestation of pregnancy: Secondary | ICD-10-CM

## 2020-09-29 LAB — POCT URINALYSIS DIPSTICK OB
Glucose, UA: NEGATIVE
POC,PROTEIN,UA: NEGATIVE

## 2020-09-29 NOTE — Progress Notes (Signed)
C/o B-H

## 2020-09-29 NOTE — Progress Notes (Signed)
Routine Prenatal Care Visit  Subjective  Heather [name] is a 24 y.o. G3P1011 at [redacted]w[redacted]d being seen today for ongoing prenatal care.  She is currently monitored for the following issues for this low-risk pregnancy and has Family history of breast cancer in female; Acne; Seizure (HCC); Gestational hypertension; Herpes; Eczema; Supervision of other normal pregnancy, antepartum; Late prenatal care; and Marijuana user on their problem list.  ----------------------------------------------------------------------------------- Patient reports no complaints.    .  .   Pincus Large Fluid denies.  ----------------------------------------------------------------------------------- The following portions of the patient's history were reviewed and updated as appropriate: allergies, current medications, past family history, past medical history, past social history, past surgical history and problem list. Problem list updated.  Objective  Blood pressure 100/68, weight 200 lb (90.7 kg), last menstrual period 02/28/2020, not currently breastfeeding. Pregravid weight 157 lb (71.2 kg) Total Weight Gain 43 lb (19.5 kg) Urinalysis: Urine Protein Negative  Urine Glucose Negative  Fetal Status:           General:  Alert, oriented and cooperative. Patient is in no acute distress.  Skin: Skin is warm and dry. No rash noted.   Cardiovascular: Normal heart rate noted  Respiratory: Normal respiratory effort, no problems with respiration noted  Abdomen: Soft, gravid, appropriate for gestational age.       Pelvic:  Cervical exam deferred        Extremities: Normal range of motion.     Mental Status: Normal mood and affect. Normal behavior. Normal judgment and thought content.   Assessment   24 y.o. G3P1011 at [redacted]w[redacted]d by  12/04/2020, by Last Menstrual Period presenting for routine prenatal visit  Plan   THIRD Problems (from 07/06/20 to present)    Problem Noted Resolved   Supervision of other normal pregnancy,  antepartum 07/06/2020 by Mirna Mires, CNM No   Overview Addendum 09/18/2020  2:30 PM by Mirna Mires, CNM     Clinic  Prenatal Labs  Dating  Blood type:   )+  Genetic Screen 1 Screen:    AFP:     Quad:     NIPS:Maternt neg, female Antibody: negative  Anatomic [place] Normal female Rubella:  non immune  GTT Early:  33             Third trimester: 09/15/20-76 RPR:   NR  Flu vaccine declines HBsAg:   negative  TDaP vaccine          09/15/20                                     Rhogam: HIV:   NR  Baby Food                                               GBS: (For PCN allergy, check sensitivities)  Contraception  Pap: NILM  Circumcision    Pediatrician    Support Person              Previous Version       Preterm labor symptoms and general obstetric precautions including but not limited to vaginal bleeding, contractions, leaking of fluid and fetal movement were reviewed in detail with the patient. Please refer to After Visit Summary for other counseling recommendations.   Return in about 2 weeks (  around 10/13/2020) for ROB.  Mirna Mires, CNM  09/29/2020 8:32 AM

## 2020-09-30 ENCOUNTER — Telehealth: Payer: Self-pay

## 2020-09-30 NOTE — Telephone Encounter (Signed)
Pt calling; 30wks; isn't feeling FM; feels baby ball up when she has a B-H ctxs; has sharp pain from lower abd down legs.  [phone]  Explained diff between true ctxs and B-H ctxs.  Is having B-H ctxs.  Reiterates baby isn't moving like usual; only feels baby when she has a B-H.  Adv no appt availability would need to go to [facility] or drink caff lie on side and call me back in an hour.

## 2020-09-30 NOTE — Telephone Encounter (Signed)
Called to ck on pt.  VM not set up yet.

## 2020-10-13 ENCOUNTER — Other Ambulatory Visit: Payer: Self-pay

## 2020-10-13 ENCOUNTER — Encounter: Payer: Self-pay | Admitting: Advanced Practice Midwife

## 2020-10-13 ENCOUNTER — Ambulatory Visit (INDEPENDENT_AMBULATORY_CARE_PROVIDER_SITE_OTHER): Payer: Medicaid Other | Admitting: Advanced Practice Midwife

## 2020-10-13 VITALS — BP 110/70 | Ht 65.0 in | Wt 202.8 lb

## 2020-10-13 DIAGNOSIS — Z3483 Encounter for supervision of other normal pregnancy, third trimester: Secondary | ICD-10-CM

## 2020-10-13 DIAGNOSIS — Z3A32 32 weeks gestation of pregnancy: Secondary | ICD-10-CM

## 2020-10-13 LAB — INHERITEST CORE(CF97,SMA,FRAX)

## 2020-10-13 NOTE — Progress Notes (Signed)
Routine Prenatal Care Visit  Subjective  Heather [name] is a 24 y.o. G3P1011 at [redacted]w[redacted]d being seen today for ongoing prenatal care.  She is currently monitored for the following issues for this low-risk pregnancy and has Family history of breast cancer in female; Acne; Seizure (HCC); Gestational hypertension; Herpes; Eczema; Supervision of other normal pregnancy, antepartum; Late prenatal care; and Marijuana user on their problem list.  ----------------------------------------------------------------------------------- Patient reports no complaints.   Contractions: Not present. Vag. Bleeding: None.  Movement: Present. Leaking Fluid denies.  ----------------------------------------------------------------------------------- The following portions of the patient's history were reviewed and updated as appropriate: allergies, current medications, past family history, past medical history, past social history, past surgical history and problem list. Problem list updated.  Objective  Blood pressure 110/70, height 5\' 5"  (1.651 m), weight 202 lb 12.8 oz (92 kg), last menstrual period 02/28/2020 Pregravid weight 157 lb (71.2 kg) Total Weight Gain 45 lb 12.8 oz (20.8 kg) Urinalysis: Urine Protein    Urine Glucose    Fetal Status: Fetal Heart Rate (bpm): 134 Fundal Height: 32 cm Movement: Present     General:  Alert, oriented and cooperative. Patient is in no acute distress.  Skin: Skin is warm and dry. No rash noted.   Cardiovascular: Normal heart rate noted  Respiratory: Normal respiratory effort, no problems with respiration noted  Abdomen: Soft, gravid, appropriate for gestational age. Pain/Pressure: Present     Pelvic:  Cervical exam deferred        Extremities: Normal range of motion.  Edema: None  Mental Status: Normal mood and affect. Normal behavior. Normal judgment and thought content.   Assessment   24 y.o. G3P1011 at [redacted]w[redacted]d by  12/04/2020, by Last Menstrual Period presenting for  routine prenatal visit  Plan   THIRD Problems (from 07/06/20 to present)    Problem Noted Resolved   Supervision of other normal pregnancy, antepartum 07/06/2020 by 07/08/2020, CNM No   Overview Addendum 09/29/2020  8:35 AM by 10/01/2020, CNM     Clinic  Prenatal Labs  Dating  Blood type:   )+  Genetic Screen 1 Screen:    AFP:     Quad:     NIPS:Maternt neg, female Antibody: negative  Anatomic Mirna Mires Normal female Rubella:  non immune  GTT Early:  75             Third trimester: 09/15/20-76 RPR:   NR  Flu vaccine declines HBsAg:   negative  TDaP vaccine          09/15/20                                     Rhogam: HIV:   NR  Baby Food                    breastfeed                            GBS: (For PCN allergy, check sensitivities)  Contraception  depo Pap: NILM  Circumcision    Pediatrician    Support Person              Previous Version       Preterm labor symptoms and general obstetric precautions including but not limited to vaginal bleeding, contractions, leaking of fluid and fetal movement were reviewed in detail with the patient.  Return in about 2 weeks (around 10/27/2020) for rob.  Tresea Mall, CNM 10/13/2020 12:01 PM

## 2020-10-13 NOTE — Progress Notes (Signed)
ROB  [redacted]w[redacted]d

## 2020-10-23 NOTE — Telephone Encounter (Signed)
Pt saw JEG 10/13/20.  Msg closed.

## 2020-10-28 ENCOUNTER — Ambulatory Visit (INDEPENDENT_AMBULATORY_CARE_PROVIDER_SITE_OTHER): Payer: Medicaid Other | Admitting: Obstetrics and Gynecology

## 2020-10-28 ENCOUNTER — Other Ambulatory Visit: Payer: Self-pay

## 2020-10-28 ENCOUNTER — Encounter: Payer: Self-pay | Admitting: Obstetrics and Gynecology

## 2020-10-28 VITALS — BP 122/74 | Wt 209.0 lb

## 2020-10-28 DIAGNOSIS — Z3A34 34 weeks gestation of pregnancy: Secondary | ICD-10-CM

## 2020-10-28 DIAGNOSIS — R102 Pelvic and perineal pain: Secondary | ICD-10-CM

## 2020-10-28 DIAGNOSIS — Z3483 Encounter for supervision of other normal pregnancy, third trimester: Secondary | ICD-10-CM

## 2020-10-28 DIAGNOSIS — O26899 Other specified pregnancy related conditions, unspecified trimester: Secondary | ICD-10-CM

## 2020-10-28 DIAGNOSIS — Z3480 Encounter for supervision of other normal pregnancy, unspecified trimester: Secondary | ICD-10-CM

## 2020-10-28 NOTE — Progress Notes (Signed)
Routine Prenatal Care Visit  Subjective  Heather [name] is a 24 y.o. G3P1011 at [redacted]w[redacted]d being seen today for ongoing prenatal care.  She is currently monitored for the following issues for this low-risk pregnancy and has Family history of breast cancer in female; Acne; Seizure (HCC); Gestational hypertension; Herpes; Eczema; Supervision of other normal pregnancy, antepartum; Late prenatal care; and Marijuana user on their problem list.  ----------------------------------------------------------------------------------- Patient reports pelvic pressure with pulling sensation on boths side with movement. Patient does not take medication. Has not tried anything to allieviate discomfort. Patient reports +FM today but says some days she is not feeling as much.   Contractions: Not present. Vag. Bleeding: None.  Movement: Present. Denies leaking of fluid.  ----------------------------------------------------------------------------------- The following portions of the patient's history were reviewed and updated as appropriate: allergies, current medications, past family history, past medical history, past social history, past surgical history and problem list. Problem list updated.   Objective  Blood pressure 122/74, weight 209 lb (94.8 kg), last menstrual period 02/28/2020, not currently breastfeeding. Pregravid weight 157 lb (71.2 kg) Total Weight Gain 52 lb (23.6 kg) Urinalysis:      Fetal Status: Fetal Heart Rate (bpm): 135 Fundal Height: 34 cm Movement: Present     General:  Alert, oriented and cooperative. Patient is in no acute distress.  Skin: Skin is warm and dry. No rash noted.   Cardiovascular: Normal heart rate noted  Respiratory: Normal respiratory effort, no problems with respiration noted  Abdomen: Soft, gravid, appropriate for gestational age. Pain/Pressure: Present     Pelvic:  Cervical exam deferred        Extremities: Normal range of motion.  Edema: None  ental Status:  Normal mood and affect. Normal behavior. Normal judgment and thought content.     Assessment   24 y.o. G3P1011 at [redacted]w[redacted]d by  12/04/2020, by Last Menstrual Period presenting for routine prenatal visit  Plan   THIRD Problems (from 07/06/20 to present)    Problem Noted Resolved   Supervision of other normal pregnancy, antepartum 07/06/2020 by Mirna Mires, CNM No   Overview Addendum 10/28/2020  9:21 AM by Zipporah Plants, CNM     Clinic  [facility] Prenatal Labs  Dating  LMP Blood type: O/Positive/-- (10/12 1508) )+  Genetic Screen 1 Screen:    AFP:     Quad:     NIPS:Maternt neg, female Antibody:Negative (10/12 1508)negative  Anatomic [place] Normal female Rubella: <0.90 (10/12 1508)non immune  GTT Early:  76             Third trimester: 09/15/20-76 RPR: Non Reactive (10/12 1508) NR  Flu vaccine declines HBsAg: Negative (10/12 1508) negative  TDaP vaccine          09/15/20                 Rhogam: n/a HIV: Non Reactive (10/12 1508) NR  Baby Food   breastfeed                            GBS: (For PCN allergy, check sensitivities)  Contraception  depo Pap: NILM  Circumcision    Pediatrician    Support Person             Previous Version      -Reviewed awareness of fetal movement, when to present to L&D for further evaluation, patient states understanding -Round ligament pain - recommended tylenol, warm baths/showers, rest, sleeping with support  between knees  Preterm labor precautions including but not limited to vaginal bleeding, contractions, leaking of fluid and fetal movement were reviewed in detail with the patient.    Return in about 2 weeks (around 11/11/2020) for ROB (can come back next week if patient desires).  Zipporah Plants, CNM, MSN [facility], [facility] 10/28/2020, 9:21 AM

## 2020-11-01 ENCOUNTER — Encounter: Payer: Self-pay | Admitting: Obstetrics and Gynecology

## 2020-11-01 ENCOUNTER — Observation Stay
Admission: EM | Admit: 2020-11-01 | Discharge: 2020-11-01 | Disposition: A | Discharge status: Home / Self Care | Payer: Medicaid Other | Attending: Obstetrics and Gynecology | Admitting: Obstetrics and Gynecology

## 2020-11-01 ENCOUNTER — Other Ambulatory Visit: Payer: Self-pay

## 2020-11-01 DIAGNOSIS — R102 Pelvic and perineal pain: Secondary | ICD-10-CM | POA: Diagnosis not present

## 2020-11-01 DIAGNOSIS — O26893 Other specified pregnancy related conditions, third trimester: Principal | ICD-10-CM

## 2020-11-01 DIAGNOSIS — Z3A35 35 weeks gestation of pregnancy: Secondary | ICD-10-CM | POA: Insufficient documentation

## 2020-11-01 DIAGNOSIS — R1030 Lower abdominal pain, unspecified: Secondary | ICD-10-CM

## 2020-11-01 DIAGNOSIS — O0933 Supervision of pregnancy with insufficient antenatal care, third trimester: Secondary | ICD-10-CM

## 2020-11-01 DIAGNOSIS — Z3480 Encounter for supervision of other normal pregnancy, unspecified trimester: Secondary | ICD-10-CM

## 2020-11-01 LAB — URINALYSIS, COMPLETE (UACMP) WITH MICROSCOPIC
Bilirubin Urine: NEGATIVE
Glucose, UA: NEGATIVE mg/dL
Hgb urine dipstick: NEGATIVE
Ketones, ur: NEGATIVE mg/dL
Leukocytes,Ua: NEGATIVE
Nitrite: NEGATIVE
Protein, ur: NEGATIVE mg/dL
Specific Gravity, Urine: 1.023 (ref 1.005–1.030)
pH: 5 (ref 5.0–8.0)

## 2020-11-01 MED ORDER — ACETAMINOPHEN 325 MG PO TABS: 650.0000 mg | ORAL_TABLET | ORAL | Status: DC | PRN

## 2020-11-01 NOTE — OB Triage Note (Addendum)
Pt is a G3P1 and [redacted]w[redacted]d presenting to L&D with c/o lower abdominal pain and pressure since 2pm today. Pt reports pain 7/10 on a 0-10 pain scale. Pt states she took a warm bath earlier and it helped for a brief time period. Pt denies LOF, VB and recent intercourse. Pt confirms positive fetal movement. Pt has hx of epilepsy and reports last seizure was in September of 2021. VSS. Monitors applied and assessing.

## 2020-11-01 NOTE — OB Triage Note (Signed)
Discharge instructions reviewed and pt provided opportunity to ask questions. All questions answered, pt verbalized understanding and pt stable at time of discharge.

## 2020-11-01 NOTE — Discharge Summary (Signed)
Physician Final Progress Note  Patient ID: Heather [name] MRN: [id number] DOB/AGE: 1996-08-27 24 y.o.  Admit date: 11/01/2020 Admitting provider: Vena Austria, MD Discharge date: 11/01/2020   Admission Diagnoses: Labor and delivery indication for care or intervention  Discharge Diagnoses:  Active Problems:   Labor and delivery indication for care or intervention Braxton  24 y.o. G3P1011 at [redacted]w[redacted]d presenting in with complaints of pelvic pressure and sharp bilateral groin pain, somewhat positional but relatively constant.  +FM, no LOF, no VB, no contractions.  Symptoms have been present for sometime and were reported this week at patient prenatal visit 10/28/2020.  Prenatal care notable for late entry of care.  Was contracting irregularly on presentation.  Cervix closed.  UA negative  Blood pressure 126/74, pulse (!) 102, temperature 98.2 F (36.8 C), temperature source Oral, resp. rate 18, height 5\' 5"  (1.651 m), weight 95.3 kg, last menstrual period 02/28/2020, not currently breastfeeding.  THIRD Problems (from 07/06/20 to present)    Problem Noted Resolved   Supervision of other normal pregnancy, antepartum 07/06/2020 by 07/08/2020, CNM No   Overview Addendum 10/28/2020  9:21 AM by 10/30/2020, CNM     Clinic  [facility] Prenatal Labs  Dating  LMP Blood type: O/Positive/-- (10/12 1508) )+  Genetic Screen 1 Screen:    AFP:     Quad:     NIPS:Maternt neg, female Antibody:Negative (10/12 1508)negative  Anatomic 02-27-1974 Normal female Rubella: <0.90 (10/12 1508)non immune  GTT Early:  76             Third trimester: 09/15/20-76 RPR: Non Reactive (10/12 1508) NR  Flu vaccine declines HBsAg: Negative (10/12 1508) negative  TDaP vaccine          09/15/20                 Rhogam: n/a HIV: Non Reactive (10/12 1508) NR  Baby Food   breastfeed                            GBS: (For PCN allergy, check sensitivities)  Contraception  depo Pap: NILM  Circumcision    Pediatrician     Support Person              Previous Version       Consults: None  Significant Findings/ Diagnostic Studies: Results for orders placed or performed during the hospital encounter of 11/01/20 (from the past 24 hour(s))  Urinalysis, Complete w Microscopic Urine, Clean Catch     Status: Abnormal   Collection Time: 11/01/20  7:33 PM  Result Value Ref Range   Color, Urine YELLOW (A) YELLOW   APPearance HAZY (A) CLEAR   Specific Gravity, Urine 1.023 1.005 - 1.030   pH 5.0 5.0 - 8.0   Glucose, UA NEGATIVE NEGATIVE mg/dL   Hgb urine dipstick NEGATIVE NEGATIVE   Bilirubin Urine NEGATIVE NEGATIVE   Ketones, ur NEGATIVE NEGATIVE mg/dL   Protein, ur NEGATIVE NEGATIVE mg/dL   Nitrite NEGATIVE NEGATIVE   Leukocytes,Ua NEGATIVE NEGATIVE   WBC, UA 0-5 0 - 5 WBC/hpf   Bacteria, UA RARE (A) NONE SEEN   Squamous Epithelial / LPF 0-5 0 - 5   Mucus PRESENT      Procedures: NST  Baseline: 135 Variability: moderate Accelerations: present Decelerations: absent Tocometry: irregular, every 5-7 minutes The patient was monitored for >30 minutes, fetal heart rate tracing was deemed reactive, category I tracing,  Discharge Condition: good  Disposition: Discharge disposition: 01-Home or Self Care       Diet: Regular diet  Discharge Activity: Activity as tolerated  Discharge Instructions    Discharge activity:  No Restrictions   Complete by: As directed    Discharge diet:  No restrictions   Complete by: As directed    Fetal Kick Count:  Lie on our left side for one hour after a meal, and count the number of times your baby kicks.  If it is less than 5 times, get up, move around and drink some juice.  Repeat the test 30 minutes later.  If it is still less than 5 kicks in an hour, notify your doctor.   Complete by: As directed    LABOR:  When conractions begin, you should start to time them from the beginning of one contraction to the beginning  of the next.  When contractions are 5  - 10 minutes apart or less and have been regular for at least an hour, you should call your health care provider.   Complete by: As directed    No sexual activity restrictions   Complete by: As directed    Notify physician for bleeding from the vagina   Complete by: As directed    Notify physician for blurring of vision or spots before the eyes   Complete by: As directed    Notify physician for chills or fever   Complete by: As directed    Notify physician for fainting spells, "black outs" or loss of consciousness   Complete by: As directed    Notify physician for increase in vaginal discharge   Complete by: As directed    Notify physician for leaking of fluid   Complete by: As directed    Notify physician for pain or burning when urinating   Complete by: As directed    Notify physician for pelvic pressure (sudden increase)   Complete by: As directed    Notify physician for severe or continued nausea or vomiting   Complete by: As directed    Notify physician for sudden gushing of fluid from the vagina (with or without continued leaking)   Complete by: As directed    Notify physician for sudden, constant, or occasional abdominal pain   Complete by: As directed    Notify physician if baby moving less than usual   Complete by: As directed      Allergies as of 11/01/2020   No Known Allergies     Medication List    STOP taking these medications   terconazole 0.4 % vaginal cream Commonly known as: Terazol 7     TAKE these medications   Prenatal Vitamin 27-0.8 MG Tabs Take 1 tablet by mouth daily.        Total time spent taking care of this patient: 30 minutes  Signed: Vena Austria 11/01/2020, 8:28 PM

## 2020-11-07 NOTE — L&D Delivery Note (Addendum)
Date of delivery: 12/07/2020 Estimated Date of Delivery: 12/04/20 Patient's last menstrual period was 02/28/2020 (approximate). EGA: [redacted]w[redacted]d  Delivery Note At 7:20 AM a viable female was delivered via Vaginal Spontaneous (Presentation: OA, Left Occiput Anterior).   APGAR: 9, 9; weight: 3690 g, 8 pounds 2 ounces   Placenta status: Spontaneous, Intact.   Cord: 3 vessels with the following complications: None.  Cord pH: NA  Called to see patient.  Mom pushed to deliver a viable female infant.  The head followed by shoulders, which delivered without difficulty, and the rest of the body.  Nuchal cord noted and delivered after delivery of the body.  Baby to mom's chest.  Cord clamped and cut after 3 min delay.  Cord blood obtained.  Placenta delivered spontaneously, intact, with a 3-vessel cord.  No lacerations. All counts correct.  Hemostasis obtained with IV pitocin and fundal massage.   Anesthesia: Epidural Episiotomy: None Lacerations:  None Suture Repair: NA Est. Blood Loss (mL): 100 ml  Mom to postpartum.  Baby to Couplet care / Skin to Skin.  Tresea Mall, CNM 12/07/2020, 7:52 AM

## 2020-11-09 ENCOUNTER — Encounter: Payer: Self-pay | Admitting: Obstetrics and Gynecology

## 2020-11-09 ENCOUNTER — Other Ambulatory Visit (HOSPITAL_COMMUNITY)
Admission: RE | Admit: 2020-11-09 | Discharge: 2020-11-09 | Disposition: A | Discharge status: Home / Self Care | Payer: Medicaid Other | Source: Ambulatory Visit | Attending: Obstetrics and Gynecology | Admitting: Obstetrics and Gynecology

## 2020-11-09 ENCOUNTER — Encounter: Payer: Medicaid Other | Admitting: Obstetrics and Gynecology

## 2020-11-09 ENCOUNTER — Other Ambulatory Visit: Payer: Self-pay

## 2020-11-09 ENCOUNTER — Ambulatory Visit (INDEPENDENT_AMBULATORY_CARE_PROVIDER_SITE_OTHER): Payer: Medicaid Other | Admitting: Obstetrics and Gynecology

## 2020-11-09 VITALS — BP 110/70 | Wt 217.0 lb

## 2020-11-09 DIAGNOSIS — Z3480 Encounter for supervision of other normal pregnancy, unspecified trimester: Secondary | ICD-10-CM | POA: Diagnosis present

## 2020-11-09 DIAGNOSIS — Z3A36 36 weeks gestation of pregnancy: Secondary | ICD-10-CM | POA: Insufficient documentation

## 2020-11-09 DIAGNOSIS — O26843 Uterine size-date discrepancy, third trimester: Secondary | ICD-10-CM

## 2020-11-09 LAB — POCT URINALYSIS DIPSTICK OB
Glucose, UA: NEGATIVE
POC,PROTEIN,UA: NEGATIVE

## 2020-11-09 LAB — OB RESULTS CONSOLE GC/CHLAMYDIA: Gonorrhea: NEGATIVE

## 2020-11-09 NOTE — Addendum Note (Signed)
Addended by: Adelene Idler on: 11/09/2020 04:24 PM   Modules accepted: Orders

## 2020-11-09 NOTE — Progress Notes (Signed)
GBS/Aptima today. +Braxton hicks. No other complaints.

## 2020-11-09 NOTE — Patient Instructions (Signed)
Third Trimester of Pregnancy The third trimester is from week 28 through week 40 (months 7 through 9). The third trimester is a time when the unborn baby (fetus) is growing rapidly. At the end of the ninth month, the fetus is about 20 inches in length and weighs 6-10 pounds. Body changes during your third trimester Your body will continue to go through many changes during pregnancy. The changes vary from woman to woman. During the third trimester:  Your weight will continue to increase. You can expect to gain 25-35 pounds (11-16 kg) by the end of the pregnancy.  You may begin to get stretch marks on your hips, abdomen, and breasts.  You may urinate more often because the fetus is moving lower into your pelvis and pressing on your bladder.  You may develop or continue to have heartburn. This is caused by increased hormones that slow down muscles in the digestive tract.  You may develop or continue to have constipation because increased hormones slow digestion and cause the muscles that push waste through your intestines to relax.  You may develop hemorrhoids. These are swollen veins (varicose veins) in the rectum that can itch or be painful.  You may develop swollen, bulging veins (varicose veins) in your legs.  You may have increased body aches in the pelvis, back, or thighs. This is due to weight gain and increased hormones that are relaxing your joints.  You may have changes in your hair. These can include thickening of your hair, rapid growth, and changes in texture. Some women also have hair loss during or after pregnancy, or hair that feels dry or thin. Your hair will most likely return to normal after your baby is born.  Your breasts will continue to grow and they will continue to become tender. A yellow fluid (colostrum) may leak from your breasts. This is the first milk you are producing for your baby.  Your belly button may stick out.  You may notice more swelling in your hands,  face, or ankles.  You may have increased tingling or numbness in your hands, arms, and legs. The skin on your belly may also feel numb.  You may feel short of breath because of your expanding uterus.  You may have more problems sleeping. This can be caused by the size of your belly, increased need to urinate, and an increase in your body's metabolism.  You may notice the fetus "dropping," or moving lower in your abdomen (lightening).  You may have increased vaginal discharge.  You may notice your joints feel loose and you may have pain around your pelvic bone. What to expect at prenatal visits You will have prenatal exams every 2 weeks until week 36. Then you will have weekly prenatal exams. During a routine prenatal visit:  You will be weighed to make sure you and the baby are growing normally.  Your blood pressure will be taken.  Your abdomen will be measured to track your baby's growth.  The fetal heartbeat will be listened to.  Any test results from the previous visit will be discussed.  You may have a cervical check near your due date to see if your cervix has softened or thinned (effaced).  You will be tested for Group B streptococcus. This happens between 35 and 37 weeks. Your health care provider may ask you:  What your birth plan is.  How you are feeling.  If you are feeling the baby move.  If you have had any abnormal   symptoms, such as leaking fluid, bleeding, severe headaches, or abdominal cramping.  If you are using any tobacco products, including cigarettes, chewing tobacco, and electronic cigarettes.  If you have any questions. Other tests or screenings that may be performed during your third trimester include:  Blood tests that check for low iron levels (anemia).  Fetal testing to check the health, activity level, and growth of the fetus. Testing is done if you have certain medical conditions or if there are problems during the pregnancy.  Nonstress test  (NST). This test checks the health of your baby to make sure there are no signs of problems, such as the baby not getting enough oxygen. During this test, a belt is placed around your belly. The baby is made to move, and its heart rate is monitored during movement. What is false labor? False labor is a condition in which you feel small, irregular tightenings of the muscles in the womb (contractions) that usually go away with rest, changing position, or drinking water. These are called Braxton Hicks contractions. Contractions may last for hours, days, or even weeks before true labor sets in. If contractions come at regular intervals, become more frequent, increase in intensity, or become painful, you should see your health care provider. What are the signs of labor?  Abdominal cramps.  Regular contractions that start at 10 minutes apart and become stronger and more frequent with time.  Contractions that start on the top of the uterus and spread down to the lower abdomen and back.  Increased pelvic pressure and dull back pain.  A watery or bloody mucus discharge that comes from the vagina.  Leaking of amniotic fluid. This is also known as your "water breaking." It could be a slow trickle or a gush. Let your health care provider know if it has a color or strange odor. If you have any of these signs, call your health care provider right away, even if it is before your due date. Follow these instructions at home: Medicines  Follow your health care provider's instructions regarding medicine use. Specific medicines may be either safe or unsafe to take during pregnancy.  Take a prenatal vitamin that contains at least 600 micrograms (mcg) of folic acid.  If you develop constipation, try taking a stool softener if your health care provider approves. Eating and drinking   Eat a balanced diet that includes fresh fruits and vegetables, whole grains, good sources of protein such as meat, eggs, or tofu,  and low-fat dairy. Your health care provider will help you determine the amount of weight gain that is right for you.  Avoid raw meat and uncooked cheese. These carry germs that can cause birth defects in the baby.  If you have low calcium intake from food, talk to your health care provider about whether you should take a daily calcium supplement.  Eat four or five small meals rather than three large meals a day.  Limit foods that are high in fat and processed sugars, such as fried and sweet foods.  To prevent constipation: ? Drink enough fluid to keep your urine clear or pale yellow. ? Eat foods that are high in fiber, such as fresh fruits and vegetables, whole grains, and beans. Activity  Exercise only as directed by your health care provider. Most women can continue their usual exercise routine during pregnancy. Try to exercise for 30 minutes at least 5 days a week. Stop exercising if you experience uterine contractions.  Avoid heavy lifting.  Do   not exercise in extreme heat or humidity, or at high altitudes.  Wear low-heel, comfortable shoes.  Practice good posture.  You may continue to have sex unless your health care provider tells you otherwise. Relieving pain and discomfort  Take frequent breaks and rest with your legs elevated if you have leg cramps or low back pain.  Take warm sitz baths to soothe any pain or discomfort caused by hemorrhoids. Use hemorrhoid cream if your health care provider approves.  Wear a good support bra to prevent discomfort from breast tenderness.  If you develop varicose veins: ? Wear support pantyhose or compression stockings as told by your healthcare provider. ? Elevate your feet for 15 minutes, 3-4 times a day. Prenatal care  Write down your questions. Take them to your prenatal visits.  Keep all your prenatal visits as told by your health care provider. This is important. Safety  Wear your seat belt at all times when driving.  Make  a list of emergency phone numbers, including numbers for family, friends, the hospital, and police and fire departments. General instructions  Avoid cat litter boxes and soil used by cats. These carry germs that can cause birth defects in the baby. If you have a cat, ask someone to clean the litter box for you.  Do not travel far distances unless it is absolutely necessary and only with the approval of your health care provider.  Do not use hot tubs, steam rooms, or saunas.  Do not drink alcohol.  Do not use any products that contain nicotine or tobacco, such as cigarettes and e-cigarettes. If you need help quitting, ask your health care provider.  Do not use any medicinal herbs or unprescribed drugs. These chemicals affect the formation and growth of the baby.  Do not douche or use tampons or scented sanitary pads.  Do not cross your legs for long periods of time.  To prepare for the arrival of your baby: ? Take prenatal classes to understand, practice, and ask questions about labor and delivery. ? Make a trial run to the hospital. ? Visit the hospital and tour the maternity area. ? Arrange for maternity or paternity leave through employers. ? Arrange for family and friends to take care of pets while you are in the hospital. ? Purchase a rear-facing car seat and make sure you know how to install it in your car. ? Pack your hospital bag. ? Prepare the baby's nursery. Make sure to remove all pillows and stuffed animals from the baby's crib to prevent suffocation.  Visit your dentist if you have not gone during your pregnancy. Use a soft toothbrush to brush your teeth and be gentle when you floss. Contact a health care provider if:  You are unsure if you are in labor or if your water has broken.  You become dizzy.  You have mild pelvic cramps, pelvic pressure, or nagging pain in your abdominal area.  You have lower back pain.  You have persistent nausea, vomiting, or  diarrhea.  You have an unusual or bad smelling vaginal discharge.  You have pain when you urinate. Get help right away if:  Your water breaks before 37 weeks.  You have regular contractions less than 5 minutes apart before 37 weeks.  You have a fever.  You are leaking fluid from your vagina.  You have spotting or bleeding from your vagina.  You have severe abdominal pain or cramping.  You have rapid weight loss or weight gain.  You have   shortness of breath with chest pain.  You notice sudden or extreme swelling of your face, hands, ankles, feet, or legs.  Your baby makes fewer than 10 movements in 2 hours.  You have severe headaches that do not go away when you take medicine.  You have vision changes. Summary  The third trimester is from week 28 through week 40, months 7 through 9. The third trimester is a time when the unborn baby (fetus) is growing rapidly.  During the third trimester, your discomfort may increase as you and your baby continue to gain weight. You may have abdominal, leg, and back pain, sleeping problems, and an increased need to urinate.  During the third trimester your breasts will keep growing and they will continue to become tender. A yellow fluid (colostrum) may leak from your breasts. This is the first milk you are producing for your baby.  False labor is a condition in which you feel small, irregular tightenings of the muscles in the womb (contractions) that eventually go away. These are called Braxton Hicks contractions. Contractions may last for hours, days, or even weeks before true labor sets in.  Signs of labor can include: abdominal cramps; regular contractions that start at 10 minutes apart and become stronger and more frequent with time; watery or bloody mucus discharge that comes from the vagina; increased pelvic pressure and dull back pain; and leaking of amniotic fluid. This information is not intended to replace advice given to you by your  health care provider. Make sure you discuss any questions you have with your health care provider. Document Revised: 02/14/2019 Document Reviewed: 11/29/2016 Elsevier Patient Education  2020 [employer]  

## 2020-11-09 NOTE — Progress Notes (Signed)
Routine Prenatal Care Visit  Subjective  Heather [name] is a 25 y.o. G3P1011 at [redacted]w[redacted]d being seen today for ongoing prenatal care.  She is currently monitored for the following issues for this low-risk pregnancy and has Family history of breast cancer in female; Acne; Seizure (HCC); Gestational hypertension; Herpes; Eczema; Supervision of other normal pregnancy, antepartum; Late prenatal care; Marijuana user; Labor and delivery indication for care or intervention; and Abdominal pain during pregnancy in third trimester on their problem list.  ----------------------------------------------------------------------------------- Patient reports no complaints.   Contractions: Irritability. Vag. Bleeding: None.  Movement: Present. Denies leaking of fluid.  ----------------------------------------------------------------------------------- The following portions of the patient's history were reviewed and updated as appropriate: allergies, current medications, past family history, past medical history, past social history, past surgical history and problem list. Problem list updated.   Objective  Blood pressure 110/70, weight 217 lb (98.4 kg), last menstrual period 02/28/2020, not currently breastfeeding. Pregravid weight 157 lb (71.2 kg) Total Weight Gain 60 lb (27.2 kg) Urinalysis:      Fetal Status: Fetal Heart Rate (bpm): 145 Fundal Height: 39 cm Movement: Present     General:  Alert, oriented and cooperative. Patient is in no acute distress.  Skin: Skin is warm and dry. No rash noted.   Cardiovascular: Normal heart rate noted  Respiratory: Normal respiratory effort, no problems with respiration noted  Abdomen: Soft, gravid, appropriate for gestational age. Pain/Pressure: Absent     Pelvic:  Cervical exam deferred Dilation: Fingertip Effacement (%): 0 Station: -3  Extremities: Normal range of motion.  Edema: None  Mental Status: Normal mood and affect. Normal behavior. Normal judgment  and thought content.     Assessment   24 y.o. G3P1011 at [redacted]w[redacted]d by  12/04/2020, by Last Menstrual Period presenting for routine prenatal visit  Plan   THIRD Problems (from 07/06/20 to present)    Problem Noted Resolved   Supervision of other normal pregnancy, antepartum 07/06/2020 by Mirna Mires, CNM No   Overview Addendum 11/09/2020  4:19 PM by Natale Milch, MD     Clinic  [facility]  Dating  LMP Blood type: O/Positive/-- (10/12 1508) )+  Genetic Screen 1 Screen:    AFP:     Quad:     NIPS:Maternt neg, female Antibody:Negative (10/12 1508)negative  Anatomic [place] Normal female Rubella: <0.90 (10/12 1508)non immune  GTT Early:  76             Third trimester: 09/15/20-76 RPR: Non Reactive (10/12 1508) NR  Flu vaccine declines HBsAg: Negative (10/12 1508) negative  TDaP vaccine          09/15/20                 Rhogam: n/a HIV: Non Reactive (10/12 1508) NR  Baby Food   breastfeed                            GBS: (For PCN allergy, check sensitivities)  Contraception  depo Pap: NILM  Circumcision    Pediatrician  Covid vaccination: encouraged  Support Person              Previous Version       Encouraged Covid vaccination Patient declines flu vacciantion GBS/ GC/ CT today  Gestational age appropriate obstetric precautions including but not limited to vaginal bleeding, contractions, leaking of fluid and fetal movement were reviewed in detail with the patient.    Return  in about 1 week (around 11/16/2020) for ROB in person and growth [place].  Natale Milch MD [facility], [facility] 11/09/2020, 4:21 PM

## 2020-11-11 LAB — CERVICOVAGINAL ANCILLARY ONLY
Chlamydia: NEGATIVE
Comment: NEGATIVE
Comment: NORMAL
Neisseria Gonorrhea: NEGATIVE

## 2020-11-13 LAB — CULTURE, BETA STREP (GROUP B ONLY): Strep Gp B Culture: NEGATIVE

## 2020-11-17 ENCOUNTER — Ambulatory Visit (INDEPENDENT_AMBULATORY_CARE_PROVIDER_SITE_OTHER): Payer: Medicaid Other | Admitting: Obstetrics and Gynecology

## 2020-11-17 ENCOUNTER — Ambulatory Visit (INDEPENDENT_AMBULATORY_CARE_PROVIDER_SITE_OTHER): Payer: Medicaid Other

## 2020-11-17 ENCOUNTER — Other Ambulatory Visit: Payer: Self-pay

## 2020-11-17 ENCOUNTER — Encounter: Payer: Self-pay | Admitting: Obstetrics and Gynecology

## 2020-11-17 VITALS — BP 115/70 | Wt 222.4 lb

## 2020-11-17 DIAGNOSIS — Z3A37 37 weeks gestation of pregnancy: Secondary | ICD-10-CM

## 2020-11-17 DIAGNOSIS — Z3A36 36 weeks gestation of pregnancy: Secondary | ICD-10-CM | POA: Diagnosis not present

## 2020-11-17 DIAGNOSIS — O0930 Supervision of pregnancy with insufficient antenatal care, unspecified trimester: Secondary | ICD-10-CM

## 2020-11-17 DIAGNOSIS — O26893 Other specified pregnancy related conditions, third trimester: Secondary | ICD-10-CM

## 2020-11-17 DIAGNOSIS — Z3480 Encounter for supervision of other normal pregnancy, unspecified trimester: Secondary | ICD-10-CM

## 2020-11-17 DIAGNOSIS — O26843 Uterine size-date discrepancy, third trimester: Secondary | ICD-10-CM

## 2020-11-17 DIAGNOSIS — R109 Unspecified abdominal pain: Secondary | ICD-10-CM

## 2020-11-17 LAB — POCT URINALYSIS DIPSTICK OB
Glucose, UA: NEGATIVE
POC,PROTEIN,UA: NEGATIVE

## 2020-11-17 NOTE — Progress Notes (Signed)
ROB [redacted]w[redacted]d

## 2020-11-17 NOTE — Progress Notes (Signed)
Routine Prenatal Care Visit  Subjective  Heather [name] is a 25 y.o. G3P1011 at [redacted]w[redacted]d being seen today for ongoing prenatal care.  She is currently monitored for the following issues for this low-risk pregnancy and has Family history of breast cancer in female; Acne; Seizure (HCC); Gestational hypertension; Herpes; Eczema; Supervision of other normal pregnancy, antepartum; Late prenatal care; Marijuana user; Labor and delivery indication for care or intervention; and Abdominal pain during pregnancy in third trimester on their problem list.  ----------------------------------------------------------------------------------- Patient reports daily moderate contractions for 2-3 hours. Declines cervical exam.   Contractions: Irregular. Vag. Bleeding: None.  Movement: Present. Denies leaking of fluid.  ----------------------------------------------------------------------------------- The following portions of the patient's history were reviewed and updated as appropriate: allergies, current medications, past family history, past medical history, past social history, past surgical history and problem list. Problem list updated.   Objective  Blood pressure 115/70, weight 222 lb 6.4 oz (100.9 kg), last menstrual period 02/28/2020, not currently breastfeeding. Pregravid weight 157 lb (71.2 kg) Total Weight Gain 65 lb 6.4 oz (29.7 kg) Urinalysis:      Fetal Status: Fetal Heart Rate (bpm): 150 Fundal Height: 37 cm Movement: Present  Presentation: Vertex  General:  Alert, oriented and cooperative. Patient is in no acute distress.  Skin: Skin is warm and dry. No rash noted.   Cardiovascular: Normal heart rate noted  Respiratory: Normal respiratory effort, no problems with respiration noted  Abdomen: Soft, gravid, appropriate for gestational age. Pain/Pressure: Absent     Pelvic:  Cervical exam deferred        Extremities: Normal range of motion.  Edema: None  Mental Status: Normal mood and  affect. Normal behavior. Normal judgment and thought content.     Assessment   24 y.o. G3P1011 at [redacted]w[redacted]d by  12/04/2020, by Last Menstrual Period presenting for routine prenatal visit  Plan   THIRD Problems (from 07/06/20 to present)    Problem Noted Resolved   Supervision of other normal pregnancy, antepartum 07/06/2020 by Mirna Mires, CNM No   Overview Addendum 11/17/2020  1:46 PM by Natale Milch, MD     Clinic  [facility]  Dating  LMP Blood type: O/Positive/-- (10/12 1508) )+  Genetic Screen 1 Screen:    AFP:     Quad:     NIPS:Maternt neg, female Antibody:Negative (10/12 1508)negative  Anatomic [place] Normal female Rubella: <0.90 (10/12 1508)non immune  GTT Early:  76             Third trimester: 09/15/20-76 RPR: Non Reactive (10/12 1508) NR  Flu vaccine declines HBsAg: Negative (10/12 1508) negative  TDaP vaccine          09/15/20                 Rhogam: n/a HIV: Non Reactive (10/12 1508) NR  Baby Food   breastfeed                            GBS:  negative  Contraception  depo Pap: NILM  Circumcision    Pediatrician  Covid vaccination: encouraged  Support Person              Previous Version       Growth [place] today WNL  Gestational age appropriate obstetric precautions including but not limited to vaginal bleeding, contractions, leaking of fluid and fetal movement were reviewed in detail with the patient.    Return  in about 1 week (around 11/24/2020) for ROB in person.  Natale Milch MD [facility], [facility] 11/17/2020, 1:46 PM

## 2020-11-17 NOTE — Patient Instructions (Signed)
https://www.nichd.nih.gov/health/topics/labor-delivery/Pages/default.aspx">  Third Trimester of Pregnancy  The third trimester of pregnancy is from week 28 through week 40. This is months 7 through 9. The third trimester is a time when the unborn baby (fetus) is growing rapidly. At the end of the ninth month, the fetus is about 20 inches long and weighs 6-10 pounds. Body changes during your third trimester During the third trimester, your body will continue to go through many changes. The changes vary and generally return to normal after your baby is born. Physical changes  Your weight will continue to increase. You can expect to gain 25-35 pounds (11-16 kg) by the end of the pregnancy if you begin pregnancy at a normal weight. If you are underweight, you can expect to gain 28-40 lb (about 13-18 kg), and if you are overweight, you can expect to gain 15-25 lb (about 7-11 kg).  You may begin to get stretch marks on your hips, abdomen, and breasts.  Your breasts will continue to grow and may hurt. A yellow fluid (colostrum) may leak from your breasts. This is the first milk you are producing for your baby.  You may have changes in your hair. These can include thickening of your hair, rapid growth, and changes in texture. Some people also have hair loss during or after pregnancy, or hair that feels dry or thin.  Your belly button may stick out.  You may notice more swelling in your hands, face, or ankles. Health changes  You may have heartburn.  You may have constipation.  You may develop hemorrhoids.  You may develop swollen, bulging veins (varicose veins) in your legs.  You may have increased body aches in the pelvis, back, or thighs. This is due to weight gain and increased hormones that are relaxing your joints.  You may have increased tingling or numbness in your hands, arms, and legs. The skin on your abdomen may also feel numb.  You may feel short of breath because of your  expanding uterus. Other changes  You may urinate more often because the fetus is moving lower into your pelvis and pressing on your bladder.  You may have more problems sleeping. This may be caused by the size of your abdomen, an increased need to urinate, and an increase in your body's metabolism.  You may notice the fetus "dropping," or moving lower in your abdomen (lightening).  You may have increased vaginal discharge.  You may notice that you have pain around your pelvic bone as your uterus distends. Follow these instructions at home: Medicines  Follow your health care provider's instructions regarding medicine use. Specific medicines may be either safe or unsafe to take during pregnancy. Do not take any medicines unless approved by your health care provider.  Take a prenatal vitamin that contains at least 600 micrograms (mcg) of folic acid. Eating and drinking  Eat a healthy diet that includes fresh fruits and vegetables, whole grains, good sources of protein such as meat, eggs, or tofu, and low-fat dairy products.  Avoid raw meat and unpasteurized juice, milk, and cheese. These carry germs that can harm you and your baby.  Eat 4 or 5 small meals rather than 3 large meals a day.  You may need to take these actions to prevent or treat constipation: ? Drink enough fluid to keep your urine pale yellow. ? Eat foods that are high in fiber, such as beans, whole grains, and fresh fruits and vegetables. ? Limit foods that are high in fat and   processed sugars, such as fried or sweet foods. Activity  Exercise only as directed by your health care provider. Most people can continue their usual exercise routine during pregnancy. Try to exercise for 30 minutes at least 5 days a week. Stop exercising if you experience contractions in the uterus.  Stop exercising if you develop pain or cramping in the lower abdomen or lower back.  Avoid heavy lifting.  Do not exercise if it is very hot or  humid or if you are at a high altitude.  If you choose to, you may continue to have sex unless your health care provider tells you not to. Relieving pain and discomfort  Take frequent breaks and rest with your legs raised (elevated) if you have leg cramps or low back pain.  Take warm sitz baths to soothe any pain or discomfort caused by hemorrhoids. Use hemorrhoid cream if your health care provider approves.  Wear a supportive bra to prevent discomfort from breast tenderness.  If you develop varicose veins: ? Wear support hose as told by your health care provider. ? Elevate your feet for 15 minutes, 3-4 times a day. ? Limit salt in your diet. Safety  Talk to your health care provider before traveling far distances.  Do not use hot tubs, steam rooms, or saunas.  Wear your seat belt at all times when driving or riding in a car.  Talk with your health care provider if someone is verbally or physically abusive to you. Preparing for birth To prepare for the arrival of your baby:  Take prenatal classes to understand, practice, and ask questions about labor and delivery.  Visit the hospital and tour the maternity area.  Purchase a rear-facing car seat and make sure you know how to install it in your car.  Prepare the baby's room or sleeping area. Make sure to remove all pillows and stuffed animals from the baby's crib to prevent suffocation. General instructions  Avoid cat litter boxes and soil used by cats. These carry germs that can cause birth defects in the baby. If you have a cat, ask someone to clean the litter box for you.  Do not douche or use tampons. Do not use scented sanitary pads.  Do not use any products that contain nicotine or tobacco, such as cigarettes, e-cigarettes, and chewing tobacco. If you need help quitting, ask your health care provider.  Do not use any herbal remedies, illegal drugs, or medicines that were not prescribed to you. Chemicals in these products  can harm your baby.  Do not drink alcohol.  You will have more frequent prenatal exams during the third trimester. During a routine prenatal visit, your health care provider will do a physical exam, perform tests, and discuss your overall health. Keep all follow-up visits. This is important. Where to find more information  American Pregnancy Association: americanpregnancy.org  American College of Obstetricians and Gynecologists: acog.org/en/Womens%20Health/Pregnancy  Office on Women's Health: womenshealth.gov/pregnancy Contact a health care provider if you have:  A fever.  Mild pelvic cramps, pelvic pressure, or nagging pain in your abdominal area or lower back.  Vomiting or diarrhea.  Bad-smelling vaginal discharge or foul-smelling urine.  Pain when you urinate.  A headache that does not go away when you take medicine.  Visual changes or see spots in front of your eyes. Get help right away if:  Your water breaks.  You have regular contractions less than 5 minutes apart.  You have spotting or bleeding from your vagina.  You   have severe abdominal pain.  You have difficulty breathing.  You have chest pain.  You have fainting spells.  You have not felt your baby move for the time period told by your health care provider.  You have new or increased pain, swelling, or redness in an arm or leg. Summary  The third trimester of pregnancy is from week 28 through week 40 (months 7 through 9).  You may have more problems sleeping. This can be caused by the size of your abdomen, an increased need to urinate, and an increase in your body's metabolism.  You will have more frequent prenatal exams during the third trimester. Keep all follow-up visits. This is important. This information is not intended to replace advice given to you by your health care provider. Make sure you discuss any questions you have with your health care provider. Document Revised: 04/01/2020 Document  Reviewed: 02/06/2020 Elsevier Patient Education  [employer].   Vaginal Delivery  Vaginal delivery means that you give birth by pushing your baby out of your birth canal (vagina). A team of health care providers will help you before, during, and after vaginal delivery. Birth experiences are unique for every woman and every pregnancy, and birth experiences vary depending on where you choose to give birth. What happens when I arrive at the birth center or hospital? Once you are in labor and have been admitted into the hospital or birth center, your health care provider may:  Review your pregnancy history and any concerns that you have.  Insert an IV into one of your veins. This may be used to give you fluids and medicines.  Check your blood pressure, pulse, temperature, and heart rate (vital signs).  Check whether your bag of water (amniotic sac) has broken (ruptured).  Talk with you about your birth plan and discuss pain control options. Monitoring Your health care provider may monitor your contractions (uterine monitoring) and your baby's heart rate (fetal monitoring). You may need to be monitored:  Often, but not continuously (intermittently).  All the time or for long periods at a time (continuously). Continuous monitoring may be needed if: ? You are taking certain medicines, such as medicine to relieve pain or make your contractions stronger. ? You have pregnancy or labor complications. Monitoring may be done by:  Placing a special stethoscope or a handheld monitoring device on your abdomen to check your baby's heartbeat and to check for contractions.  Placing monitors on your abdomen (external monitors) to record your baby's heartbeat and the frequency and length of contractions.  Placing monitors inside your uterus through your vagina (internal monitors) to record your baby's heartbeat and the frequency, length, and strength of your contractions. Depending on the type of  monitor, it may remain in your uterus or on your baby's head until birth.  Telemetry. This is a type of continuous monitoring that can be done with external or internal monitors. Instead of having to stay in bed, you are able to move around during telemetry. Physical exam Your health care provider may perform frequent physical exams. This may include:  Checking how and where your baby is positioned in your uterus.  Checking your cervix to determine: ? Whether it is thinning out (effacing). ? Whether it is opening up (dilating). What happens during labor and delivery? Normal labor and delivery is divided into the following three stages: Stage 1  This is the longest stage of labor.  This stage can last for hours or days.  Throughout  this stage, you will feel contractions. Contractions generally feel mild, infrequent, and irregular at first. They get stronger, more frequent (about every 2-3 minutes), and more regular as you move through this stage.  This stage ends when your cervix is completely dilated to 4 inches (10 cm) and completely effaced. Stage 2  This stage starts once your cervix is completely effaced and dilated and lasts until the delivery of your baby.  This stage may last from 20 minutes to 2 hours.  This is the stage where you will feel an urge to push your baby out of your vagina.  You may feel stretching and burning pain, especially when the widest part of your baby's head passes through the vaginal opening (crowning).  Once your baby is delivered, the umbilical cord will be clamped and cut. This usually occurs after waiting a period of 1-2 minutes after delivery.  Your baby will be placed on your bare chest (skin-to-skin contact) in an upright position and covered with a warm blanket. Watch your baby for feeding cues, like rooting or sucking, and help the baby to your breast for his or her first feeding. Stage 3  This stage starts immediately after the birth of  your baby and ends after you deliver the placenta.  This stage may take anywhere from 5 to 30 minutes.  After your baby has been delivered, you will feel contractions as your body expels the placenta and your uterus contracts to control bleeding.   What can I expect after labor and delivery?  After labor is over, you and your baby will be monitored closely until you are ready to go home to ensure that you are both healthy. Your health care team will teach you how to care for yourself and your baby.  You and your baby will stay in the same room (rooming in) during your hospital stay. This will encourage early bonding and successful breastfeeding.  You may continue to receive fluids and medicines through an IV.  Your uterus will be checked and massaged regularly (fundal massage).  You will have some soreness and pain in your abdomen, vagina, and the area of skin between your vaginal opening and your anus (perineum).  If an incision was made near your vagina (episiotomy) or if you had some vaginal tearing during delivery, cold compresses may be placed on your episiotomy or your tear. This helps to reduce pain and swelling.  You may be given a squirt bottle to use instead of wiping when you go to the bathroom. To use the squirt bottle, follow these steps: ? Before you urinate, fill the squirt bottle with warm water. Do not use hot water. ? After you urinate, while you are sitting on the toilet, use the squirt bottle to rinse the area around your urethra and vaginal opening. This rinses away any urine and blood. ? Fill the squirt bottle with clean water every time you use the bathroom.  It is normal to have vaginal bleeding after delivery. Wear a sanitary pad for vaginal bleeding and discharge. Summary  Vaginal delivery means that you will give birth by pushing your baby out of your birth canal (vagina).  Your health care provider may monitor your contractions (uterine monitoring) and your  baby's heart rate (fetal monitoring).  Your health care provider may perform a physical exam.  Normal labor and delivery is divided into three stages.  After labor is over, you and your baby will be monitored closely until you are ready to  go home. This information is not intended to replace advice given to you by your health care provider. Make sure you discuss any questions you have with your health care provider. Document Revised: 11/28/2017 Document Reviewed: 11/28/2017 Elsevier Patient Education  2021 [employer].   Pain Relief During Labor and Delivery Many things can cause pain during labor and delivery, including:  Pressure due to the baby moving through the pelvis.  Stretching of tissues due to the baby moving through the birth canal.  Muscle tension due to anxiety or nervousness.  The uterus tightening (contracting)and relaxing to help move the baby. How do I get pain relief during labor and delivery? Discuss your pain relief options with your health care provider during your prenatal visits. Explore the options offered by your hospital or birth center. There are many ways to deal with the pain of labor and delivery. You can try relaxation techniques or doing relaxing activities, taking a warm shower or bath (hydrotherapy), or other methods. There are also many medicines available to help control pain. Relaxation techniques and activities Practice relaxation techniques or do relaxing activities, such as:  Focused breathing.  Meditation.  Visualization.  Aroma therapy.  Listening to your favorite music.  Hypnosis. Hydrotherapy Take a warm shower or bath. This may:  Provide comfort and relaxation.  Lessen your feeling of pain.  Reduce the amount of pain medicine needed.  Shorten the length of labor. Other methods Try doing other things, such as:  Getting a massage or having counterpressure on your back.  Applying warm packs or ice packs.  Changing  positions often, moving around, or using a birthing ball. Medicines You may be given:  Pain medicine through an IV or an injection into a muscle.  Pain medicine inserted into your spinal column.  Injections of sterile water just under the skin on your lower back.  Nitrous oxide inhalation therapy, also called laughing gas.   What kinds of medicine are available for pain relief? There are two kinds of medicines that can be used to relieve pain during labor and delivery:  Analgesics. These medicines decrease pain without causing you to lose feeling or the ability to move your muscles.  Anesthetics. These medicines block feeling in the body and can decrease your ability to move freely. Both kinds of medicine can cause minor side effects, such as nausea, trouble concentrating, and sleepiness. They can also affect the baby's heart rate before birth and his or her breathing after birth. For this reason, health care providers are careful about when and how much medicine is given. Which medicines are used to provide pain relief? Common medicines The most common medicines used to help manage pain during labor and delivery include:  Opioids. Opioids are medicines that decrease how much pain you feel (perception of pain). These medicines can be given through an IV or may be used with anesthetics to block pain.  Epidural analgesia. ? Epidural analgesia is given through a very thin tube that is inserted into the lower back. Medicine is delivered continuously to the area near your spinal column nerves (epidural space). After having this treatment, you may be able to move your legs, but you will not be able to walk. Depending on the amount and type of medicine given, you may lose all feeling in the lower half of your body, or you may have some sensation, including the urge to push. This treatment can be used to give pain relief for a vaginal birth. ? Sometimes, a  numbing medicine is injected into the  spinal fluid when an epidural catheter is placed. This provides for immediate relief but only lasts for 1-2 hours. Once it wears off, the epidural will provide pain relief. This is called a combined spinal-epidural (CSE) block.  Intrathecal analgesia (spinal analgesia). Intrathecal analgesia is similar to epidural analgesia, but the medicine is injected into the spinal fluid instead of the epidural space. It is usually only given once. It starts to relieve pain quickly, but the pain relief lasts only 1-2 hours.  Pudendal block. This block is done by injecting numbing medicine through the wall of the vagina and into a nerve in the pelvis. Other medicines Other medicines used to help manage pain during labor and delivery include:  Local anesthetics. These are used to numb a small area of the body. They may be used along with another kind of medicine or used to numb the nerves of the vagina, cervix, and perineum during the second stage of labor.  Spinal block (spinal anesthesia). Spinal anesthesia is similar to spinal analgesia, but the medicine that is used contains longer-acting numbing medicines and pain medicines. This type of anesthesia can be used for a cesarean delivery and allows you to stay awake for the birth of your baby.  General anesthetics cause you to lose consciousness so you do not feel pain. They are usually only used for an emergency cesarean delivery. These medicines are given through an IV or a mask or both. These medicines are used as part of a procedure or for an emergency delivery. Summary  Women have many options to help them manage the pain associated with labor and delivery.  You can try doing relaxing activities, taking a warm shower or bath, or other methods.  There are also many medicines available to help control pain during labor and delivery.  Talk with your health care provider about what options are available to you. This information is not intended to replace  advice given to you by your health care provider. Make sure you discuss any questions you have with your health care provider. Document Revised: 09/11/2019 Document Reviewed: 09/11/2019 Elsevier Patient Education  2021 [place] [employer].   Augmentation of Labor Augmentation of labor is when steps are taken to stimulate and strengthen contractions of the uterus during labor. This may be done when contractions have slowed or stopped, and the progress of labor and delivery of the baby is delayed. Before augmentation of labor begins, these factors will be considered:  The mother's medical condition and the baby's condition.  The size and position of the baby.  The size of the birth canal. Tell a health care provider about:  Any allergies you have.  All medicines you are taking, including vitamins, herbs, eye drops, creams, and over-the-counter medicines.  Any problems you or your family members have had with anesthetic medicines.  Any surgeries you have had.  Any blood disorders you have.  Any medical conditions you have. What are the risks? Generally, this is a safe procedure. However, problems may occur, including:  Tearing (rupture) of the uterus.  Breaking off (abruption) of the placenta.  Increased risk of infection for you and your baby.  Increased risk of cesarean, forceps, or vacuum delivery.  Excessive bleeding after delivery (postpartum hemorrhage).  Umbilical cord prolapse. This can cause the umbilical cord to get squeezed during birth.  Too much stimulation of the contractions. This can result in continuous, prolonged, or very strong contractions.  Your baby could fail  to get enough blood flow or oxygen. This can be life-threatening (fetal death). What happens during the procedure? Augmentation of labor may include:  Giving you medicine that stimulates contractions (oxytocin). This is given through an IV that is inserted into a vein in your arm.  Breaking the  fluid-filled sac that surrounds the fetus (amniotic sac). This procedure is called artificial rupture of membranes. This procedure may vary among health care providers and hospitals.   Summary  Augmentation of labor is when steps are taken to stimulate and strengthen contractions of the uterus during labor. This may be done when contractions have slowed down or stopped, and the progress of labor and delivery of the baby is delayed.  Labor augmentation may be done using medicine to stimulate contractions (oxytocin). Or it can be done by breaking the fluid-filled sac that surrounds the fetus (amniotic sac).  Generally, this is a safe procedure. However, problems can occur. Talk with your health care provider about the potential risks and benefits of labor augmentation if this is offered to you. This information is not intended to replace advice given to you by your health care provider. Make sure you discuss any questions you have with your health care provider. Document Revised: 08/06/2020 Document Reviewed: 08/06/2020 Elsevier Patient Education  2021 [employer].

## 2020-11-22 ENCOUNTER — Other Ambulatory Visit: Payer: Self-pay

## 2020-11-22 ENCOUNTER — Observation Stay
Admission: EM | Admit: 2020-11-22 | Discharge: 2020-11-22 | Disposition: A | Discharge status: Home / Self Care | Payer: Medicaid Other | Attending: Obstetrics and Gynecology | Admitting: Obstetrics and Gynecology

## 2020-11-22 ENCOUNTER — Encounter: Payer: Self-pay | Admitting: Obstetrics and Gynecology

## 2020-11-22 DIAGNOSIS — O26893 Other specified pregnancy related conditions, third trimester: Principal | ICD-10-CM | POA: Insufficient documentation

## 2020-11-22 DIAGNOSIS — Z3490 Encounter for supervision of normal pregnancy, unspecified, unspecified trimester: Secondary | ICD-10-CM

## 2020-11-22 DIAGNOSIS — R109 Unspecified abdominal pain: Secondary | ICD-10-CM

## 2020-11-22 DIAGNOSIS — F17210 Nicotine dependence, cigarettes, uncomplicated: Secondary | ICD-10-CM | POA: Diagnosis not present

## 2020-11-22 DIAGNOSIS — Z3A38 38 weeks gestation of pregnancy: Secondary | ICD-10-CM | POA: Insufficient documentation

## 2020-11-22 DIAGNOSIS — O99333 Smoking (tobacco) complicating pregnancy, third trimester: Secondary | ICD-10-CM | POA: Diagnosis not present

## 2020-11-22 DIAGNOSIS — Z3480 Encounter for supervision of other normal pregnancy, unspecified trimester: Secondary | ICD-10-CM

## 2020-11-22 NOTE — OB Triage Note (Signed)
Pt presents to labor and delivery with constant pain in her back, butt, and stomach that she describes as being sharp pressure since 11pm last night. Pt denies bleeding or LOF. Reports positive fetal movement. Pt denies recent intercourse.VSS. Will continue to monitor.

## 2020-11-22 NOTE — Final Progress Note (Addendum)
Physician Final Progress Note  Patient ID: Heather [name] MRN: [id number] DOB/AGE: 29-Jun-1996 25 y.o.  Admit date: 11/22/2020 Admitting provider: Zipporah Plants, CNM Discharge date: 11/22/2020   Admission Diagnoses: Active Problems:   Labor and delivery indication for care or intervention   Abdominal pain during pregnancy in third trimester   Pregnancy  Discharge Diagnoses:  Active Problems:   Abdominal pain during pregnancy in third trimester   Pregnancy   History of Present Illness: The patient is a 25 y.o. female G3P1011 at [redacted]w[redacted]d who presents for abdominal pain which patient describes as sharp and constant since 2300 last evening. Patient states she tried tylenol and a hot bath with no relief. Patient rates the pain as 8/10. Patient denies sensation of pain coming or going or contraction-type pain. Patient reports +FM, denies LOF or VB. Patient notes increase in milky white discharge over last several days.  Past Medical History:  Diagnosis Date  . Seizures (HCC)    last seizure November 2019    Past Surgical History:  Procedure Laterality Date  . NO PAST SURGERIES    . NO PAST SURGERIES      No current facility-administered medications on file prior to encounter.   Current Outpatient Medications on File Prior to Encounter  Medication Sig Dispense Refill  . Prenatal Vit-Fe Fumarate-FA (PRENATAL VITAMIN) 27-0.8 MG TABS Take 1 tablet by mouth daily. 100 tablet 0    No Known Allergies  Social History   Socioeconomic History  . Marital status: Single    Spouse name: Not on file  . Number of children: 1  . Years of education: 56  . Highest education level: High school graduate  Occupational History  . Occupation: RETAIL Investment banker, corporate: [id number]    Comment: FULL TIME  Tobacco Use  . Smoking status: Current Some Day Smoker    Types: Cigarettes    Last attempt to quit: 07/28/2018    Years since quitting: 2.3  . Smokeless tobacco: Never Used  . Tobacco  comment: smokes one cigarette twice a day  Vaping Use  . Vaping Use: Never used  Substance and Sexual Activity  . Alcohol use: No  . Drug use: No  . Sexual activity: Yes    Birth control/protection: Injection    Comment: depo  Other Topics Concern  . Not on file  Social History Narrative  . Not on file   Social Determinants of Health   Financial Resource Strain: Not on file  Food Insecurity: Not on file  Transportation Needs: Not on file  Physical Activity: Not on file  Stress: Not on file  Social Connections: Not on file  Intimate Partner Violence: Not At Risk  . Fear of Current or Ex-Partner: No  . Emotionally Abused: No  . Physically Abused: No  . Sexually Abused: No    Family History  Problem Relation Age of Onset  . Bipolar disorder Mother   . OCD Mother   . Hypertension Father   . Hyperlipidemia Father   . Cancer Maternal Uncle        breast  . Cancer Maternal Grandmother        breast  . Hypertension Paternal Grandmother   . Hyperlipidemia Paternal Grandmother   . COPD Neg Hx   . Diabetes Neg Hx   . Heart disease Neg Hx   . Stroke Neg Hx      Review of Systems  Constitutional: Negative.   HENT: Negative.   Eyes: Negative.  Respiratory: Negative.   Cardiovascular: Negative.   Gastrointestinal: Positive for abdominal pain.       Pain radiating through abd  Genitourinary: Negative.   Musculoskeletal: Negative.   Skin: Negative.   Neurological: Negative.   Endo/Heme/Allergies: Negative.   Psychiatric/Behavioral: Negative.      Physical Exam: BP 116/74 (BP Location: Right Arm)   Pulse 87   Temp 98.4 F (36.9 C) (Oral)   Resp 18   Ht 5\' 4"  (1.626 m)   Wt 100.7 kg   LMP 02/28/2020 (Approximate)   SpO2 99%   BMI 38.11 kg/m   Physical Exam Constitutional:      Appearance: Normal appearance.  Genitourinary:     Genitourinary Comments: SVE per nursing 1/thick/-3/posterior  HENT:     Head: Normocephalic.  Cardiovascular:     Rate and  Rhythm: Normal rate and regular rhythm.     Pulses: Normal pulses.  Pulmonary:     Effort: Pulmonary effort is normal.  Abdominal:     Tenderness: There is no abdominal tenderness. There is no guarding.     Comments: Gravid, non-tender, size consistent with [redacted] wk gestation  Musculoskeletal:        General: Normal range of motion.     Cervical back: Normal range of motion.  Neurological:     Mental Status: She is alert and oriented to person, place, and time.  Skin:    General: Skin is warm and dry.  Psychiatric:        Mood and Affect: Mood normal.        Behavior: Behavior normal.  Vitals and nursing note reviewed.     Consults: None  Significant Findings/ Diagnostic Studies:  NONSTRESS TEST INTERPRETATION  INDICATIONS: decreased fetal movement FHR baseline: 135 RESULTS:  A NST procedure was performed with FHR monitoring and a normal baseline established, appropriate time of 20-40 minutes of evaluation, and accels >2 seen w 15x15 characteristics.  Results show a REACTIVE NST.   Toco: irregular ctx noted   Procedures: NST  Hospital Course: The patient was admitted to Labor and Delivery Triage for observation. VSS. NST reactive, fetal status reassuring. SVE: 1/thick/-3. Irregular contractions noted on toco. Reviewed findings with patient, discussed assessment inconsistent with active labor. Present options for further evaluation of abd pain. Patient desires discharge home and will RTC with worsening or persistent pain. Reviewed S&S of labor, PIH, and when to RTC for further evaluation. Pt stated understanding.  Discharge Condition: good  Disposition: Discharge disposition: 01-Home or Self Care       Diet: Regular diet  Discharge Activity: Activity as tolerated   Allergies as of 11/22/2020   No Known Allergies     Medication List    TAKE these medications   Prenatal Vitamin 27-0.8 MG Tabs Take 1 tablet by mouth daily.        Total time spent taking care  of this patient: 20 minutes  Signed:  11/24/2020, CNM 11/22/2020, 5:50 AM

## 2020-11-22 NOTE — Discharge Summary (Signed)
See final progress note. 

## 2020-11-24 ENCOUNTER — Other Ambulatory Visit: Payer: Self-pay

## 2020-11-24 ENCOUNTER — Ambulatory Visit (INDEPENDENT_AMBULATORY_CARE_PROVIDER_SITE_OTHER): Payer: Medicaid Other | Admitting: Obstetrics

## 2020-11-24 VITALS — BP 120/70 | Ht 64.0 in | Wt 227.4 lb

## 2020-11-24 DIAGNOSIS — Z3480 Encounter for supervision of other normal pregnancy, unspecified trimester: Secondary | ICD-10-CM

## 2020-11-24 DIAGNOSIS — Z3A38 38 weeks gestation of pregnancy: Secondary | ICD-10-CM

## 2020-11-24 LAB — POCT URINALYSIS DIPSTICK OB
Glucose, UA: NEGATIVE
POC,PROTEIN,UA: NEGATIVE

## 2020-11-24 NOTE — Progress Notes (Signed)
Routine Prenatal Care Visit  Subjective  Heather [name] is a 25 y.o. G3P1011 at [redacted]w[redacted]d being seen today for ongoing prenatal care.  She is currently monitored for the following issues for this high-risk pregnancy and has Family history of breast cancer in female; Acne; Seizure (HCC); Gestational hypertension; Herpes; Eczema; Supervision of other normal pregnancy, antepartum; Late prenatal care; Marijuana user; Labor and delivery indication for care or intervention; Abdominal pain during pregnancy in third trimester; and Pregnancy on their problem list.  ----------------------------------------------------------------------------------- Patient reports backache, no bleeding, no cramping and occasional contractions.  She was seen two evening s ago for a labor check at the hospital and was 1 cm dilated. Some difficulty sleeping at night. Contractions: Irritability. Vag. Bleeding: None.  Movement: Present. Leaking Fluid denies.  ----------------------------------------------------------------------------------- The following portions of the patient's history were reviewed and updated as appropriate: allergies, current medications, past family history, past medical history, past social history, past surgical history and problem list. Problem list updated.  Objective  Blood pressure 120/70, height 5\' 4"  (1.626 m), weight 227 lb 6.4 oz (103.1 kg), last menstrual period 02/28/2020, not currently breastfeeding. Pregravid weight 157 lb (71.2 kg) Total Weight Gain 70 lb 6.4 oz (31.9 kg) Urinalysis: Urine Protein    Urine Glucose    Fetal Status:     Movement: Present     General:  Alert, oriented and cooperative. Patient is in no acute distress.  Skin: Skin is warm and dry. No rash noted.   Cardiovascular: Normal heart rate noted  Respiratory: Normal respiratory effort, no problems with respiration noted  Abdomen: Soft, gravid, appropriate for gestational age. Pain/Pressure: Present     Pelvic:   Cervical exam performed      1.5cms/40%/-3. Cervix is soft and midway.  Extremities: Normal range of motion.     Mental Status: Normal mood and affect. Normal behavior. Normal judgment and thought content.   Assessment   24 y.o. G3P1011 at [redacted]w[redacted]d by  12/04/2020, by Last Menstrual Period presenting for routine prenatal visit  Plan   THIRD Problems (from 07/06/20 to present)    Problem Noted Resolved   Supervision of other normal pregnancy, antepartum 07/06/2020 by 07/08/2020, CNM No   Overview Addendum 11/17/2020  1:46 PM by 01/15/2021, MD     Clinic  [facility] Prenatal Labs  Dating  LMP Blood type: O/Positive/-- (10/12 1508) )+  Genetic Screen 1 Screen:    AFP:     Quad:     NIPS:Maternt neg, female Antibody:Negative (10/12 1508)negative  Anatomic 02-27-1974 Normal female Rubella: <0.90 (10/12 1508)non immune  GTT Early:  76             Third trimester: 09/15/20-76 RPR: Non Reactive (10/12 1508) NR  Flu vaccine declines HBsAg: Negative (10/12 1508) negative  TDaP vaccine          09/15/20                 Rhogam: n/a HIV: Non Reactive (10/12 1508) NR  Baby Food   breastfeed                            GBS:  negative  Contraception  depo Pap: NILM  Circumcision    Pediatrician  Covid vaccination: encouraged  Support Person              Previous Version       Term labor symptoms and general obstetric precautions including  but not limited to vaginal bleeding, contractions, leaking of fluid and fetal movement were reviewed in detail with the patient. Please refer to After Visit Summary for other counseling recommendations.  Reassured that her irregular contractions are a prelude to her active labor. Reviewed the sxs of active labor and how to address latent phase at home.  Return in about 1 week (around 12/01/2020) for return OB.  Mirna Mires, CNM  11/24/2020 10:32 AM

## 2020-11-24 NOTE — Progress Notes (Signed)
Pt states she has been leaking mucus all night. ROB visit

## 2020-11-25 ENCOUNTER — Emergency Department
Admission: EM | Admit: 2020-11-25 | Discharge: 2020-11-25 | Disposition: A | Discharge status: Home / Self Care | Payer: Medicaid Other | Attending: Emergency Medicine | Admitting: Emergency Medicine

## 2020-11-25 ENCOUNTER — Other Ambulatory Visit: Payer: Self-pay

## 2020-11-25 ENCOUNTER — Encounter: Payer: Self-pay | Admitting: Emergency Medicine

## 2020-11-25 DIAGNOSIS — F17210 Nicotine dependence, cigarettes, uncomplicated: Secondary | ICD-10-CM | POA: Insufficient documentation

## 2020-11-25 DIAGNOSIS — R059 Cough, unspecified: Secondary | ICD-10-CM | POA: Insufficient documentation

## 2020-11-25 DIAGNOSIS — Z20822 Contact with and (suspected) exposure to covid-19: Secondary | ICD-10-CM | POA: Diagnosis not present

## 2020-11-25 DIAGNOSIS — R0981 Nasal congestion: Secondary | ICD-10-CM | POA: Insufficient documentation

## 2020-11-25 LAB — SARS CORONAVIRUS 2 (TAT 6-24 HRS): SARS Coronavirus 2: NEGATIVE

## 2020-11-25 NOTE — ED Notes (Signed)
See triage note  States she developed sore throat couple of days ago  No fever or cough  Low grade temp on arrival

## 2020-11-25 NOTE — ED Triage Notes (Signed)
Pt comes into the ED via POV c/o sore throat that started yesterday.  Pt states it is unknown if she has been exposed to anyone that is COVID positive.  Pt in NAd with even and unlabored respirations.  Pt is currently 38 weeks but denies any pregnancy related concerns.

## 2020-11-25 NOTE — ED Provider Notes (Signed)
[facility] Emergency Department Provider Note  ____________________________________________   Event Date/Time   First MD Initiated Contact with Patient 11/25/20 1015     (approximate)  I have reviewed the triage vital signs and the nursing notes.   HISTORY  Chief Complaint Sore Throat    HPI Heather [name] is a 25 y.o. female presents to the emergency department with URI symptoms for 2 days.   Is complaining of cough, congestion, denies fever, denies chills, denies chest pain, denies shortness of breath denies close contact with Covid19+ patient, patient is not vaccinated.   Past Medical History:  Diagnosis Date  . Seizures [facility])    last seizure November 2019    Patient Active Problem List   Diagnosis Date Noted  . Pregnancy 11/22/2020  . Labor and delivery indication for care or intervention 11/01/2020  . Abdominal pain during pregnancy in third trimester   . Marijuana user 07/15/2020  . Supervision of other normal pregnancy, antepartum 07/06/2020  . Late prenatal care 07/06/2020  . Gestational hypertension 04/01/2019  . Seizure (HCC) 02/28/2018  . Herpes 05/24/2016  . Acne 12/06/2015  . Family history of breast cancer in female 11/30/2015  . Eczema 01/14/2014    Past Surgical History:  Procedure Laterality Date  . NO PAST SURGERIES    . NO PAST SURGERIES      Prior to Admission medications   Medication Sig Start Date End Date Taking? Authorizing Provider  Prenatal Vit-Fe Fumarate-FA (PRENATAL VITAMIN) 27-0.8 MG TABS Take 1 tablet by mouth daily. 04/22/20   Federico Flake, MD    Allergies Patient has no known allergies.  Family History  Problem Relation Age of Onset  . Bipolar disorder Mother   . OCD Mother   . Hypertension Father   . Hyperlipidemia Father   . Cancer Maternal Uncle        breast  . Cancer Maternal Grandmother        breast  . Hypertension Paternal Grandmother   . Hyperlipidemia Paternal  Grandmother   . COPD Neg Hx   . Diabetes Neg Hx   . Heart disease Neg Hx   . Stroke Neg Hx     Social History Social History   Tobacco Use  . Smoking status: Current Some Day Smoker    Types: Cigarettes    Last attempt to quit: 07/28/2018    Years since quitting: 2.3  . Smokeless tobacco: Never Used  . Tobacco comment: smokes one cigarette twice a day  Vaping Use  . Vaping Use: Never used  Substance Use Topics  . Alcohol use: No  . Drug use: No    Review of Systems  Constitutional: Denies fever/chills Eyes: No visual changes. ENT: Denies sore throat. Respiratory: Complains of cough Cardiovascular: Denies chest pain Gastrointestinal: Denies abdominal pain Genitourinary: Negative for dysuria. Musculoskeletal: Negative for back pain. Skin: Negative for rash. Neurological: Denies neurological changes    ____________________________________________   PHYSICAL EXAM:  VITAL SIGNS: ED Triage Vitals  Enc Vitals Group     BP 11/25/20 0916 120/86     Pulse Rate 11/25/20 0916 100     Resp 11/25/20 0916 18     Temp 11/25/20 0916 99.2 F (37.3 C)     Temp Source 11/25/20 0916 Oral     SpO2 11/25/20 0916 98 %     Weight 11/25/20 0923 227 lb (103 kg)     Height 11/25/20 0923 5\' 6"  (1.676 m)     Head  Circumference --      Peak Flow --      Pain Score 11/25/20 0923 6     Pain Loc --      Pain Edu? --      Excl. in GC? --     Constitutional: Alert and oriented. Well appearing and in no acute distress. Eyes: Conjunctivae are normal.  Head: Atraumatic. Nose: No congestion/rhinnorhea. Mouth/Throat: Mucous membranes are moist.  Neck:  supple no lymphadenopathy noted Cardiovascular: Normal rate, regular rhythm. Heart sounds are normal Respiratory: Normal respiratory effort.  No retractions, lungs CTA GU: deferred Musculoskeletal: FROM all extremities, warm and well perfused Neurologic:  Normal speech and language.  Skin:  Skin is warm, dry and intact. No rash  noted. Psychiatric: Mood and affect are normal. Speech and behavior are normal.  ____________________________________________   LABS (all labs ordered are listed, but only abnormal results are displayed)  Labs Reviewed  SARS CORONAVIRUS 2 (TAT 6-24 HRS)   ____________________________________________   ____________________________________________  RADIOLOGY    ____________________________________________   PROCEDURES  Procedure(s) performed: No  Procedures    ____________________________________________   INITIAL IMPRESSION / ASSESSMENT AND PLAN / ED COURSE  Pertinent labs & imaging results that were available during my care of the patient were reviewed by me and considered in my medical decision making (see chart for details).   Patient is a 25 year old 38-week pregnant female who complains of URI symptoms.  Exam is consistent with covid.    Pending test for covid   The patient was instructed to quarantine themselves at home.  Follow-up with your regular doctor if any concerns.  Return emergency department for worsening. OTC measures discussed     Heather [name] was evaluated in Emergency Department on 11/25/2020 for the symptoms described in the history of present illness. She was evaluated in the context of the global COVID-19 pandemic, which necessitated consideration that the patient might be at risk for infection with the SARS-CoV-2 virus that causes COVID-19. Institutional protocols and algorithms that pertain to the evaluation of patients at risk for COVID-19 are in a state of rapid change based on information released by regulatory bodies including the CDC and federal and state organizations. These policies and algorithms were followed during the patient's care in the ED.   As part of my medical decision making, I reviewed the following data within the electronic MEDICAL RECORD NUMBER Nursing notes reviewed and incorporated, Old chart reviewed, Notes from  prior ED visits and [place] Controlled Substance Database  ____________________________________________   FINAL CLINICAL IMPRESSION(S) / ED DIAGNOSES  Final diagnoses:  Suspected COVID-19 virus infection      NEW MEDICATIONS STARTED DURING THIS VISIT:  New Prescriptions   No medications on file     Note:  This document was prepared using Dragon voice recognition software and may include unintentional dictation errors.    Faythe Ghee, PA-C 11/25/20 1148    Merwyn Katos, MD 11/25/20 1520

## 2020-11-25 NOTE — Discharge Instructions (Addendum)

## 2020-11-30 ENCOUNTER — Ambulatory Visit (INDEPENDENT_AMBULATORY_CARE_PROVIDER_SITE_OTHER): Payer: Medicaid Other | Admitting: Obstetrics and Gynecology

## 2020-11-30 ENCOUNTER — Other Ambulatory Visit: Payer: Self-pay

## 2020-11-30 VITALS — BP 124/72 | Wt 226.0 lb

## 2020-11-30 DIAGNOSIS — Z3480 Encounter for supervision of other normal pregnancy, unspecified trimester: Secondary | ICD-10-CM

## 2020-11-30 DIAGNOSIS — Z3A39 39 weeks gestation of pregnancy: Secondary | ICD-10-CM

## 2020-11-30 LAB — POCT URINALYSIS DIPSTICK OB
Glucose, UA: NEGATIVE
POC,PROTEIN,UA: NEGATIVE

## 2020-11-30 NOTE — Patient Instructions (Signed)
Induction Monday 12/07/2020 at 08:00 AM COVID testing [facility] 12/04/2021 8:00-10:00 AM

## 2020-11-30 NOTE — Progress Notes (Signed)
Routine Prenatal Care Visit  Subjective  Heather [name] is a 25 y.o. G3P1011 at [redacted]w[redacted]d being seen today for ongoing prenatal care.  She is currently monitored for the following issues for this low-risk pregnancy and has Family history of breast cancer in female; Acne; Seizure (HCC); Gestational hypertension; Herpes; Eczema; Supervision of other normal pregnancy, antepartum; Late prenatal care; Marijuana user; Abdominal pain during pregnancy in third trimester; and Pregnancy on their problem list.  ----------------------------------------------------------------------------------- Patient reports no complaints.   Contractions: Irregular. Vag. Bleeding: None.  Movement: Present. Denies leaking of fluid.  ----------------------------------------------------------------------------------- The following portions of the patient's history were reviewed and updated as appropriate: allergies, current medications, past family history, past medical history, past social history, past surgical history and problem list. Problem list updated.   Objective  Blood pressure 124/72, weight 226 lb (102.5 kg), last menstrual period 02/28/2020, not currently breastfeeding. Pregravid weight 157 lb (71.2 kg) Total Weight Gain 69 lb (31.3 kg)  Body mass index is 36.48 kg/m.  Urinalysis:      Fetal Status: Fetal Heart Rate (bpm): 130 Fundal Height: 39 cm Movement: Present  Presentation: Vertex  General:  Alert, oriented and cooperative. Patient is in no acute distress.  Skin: Skin is warm and dry. No rash noted.   Cardiovascular: Normal heart rate noted  Respiratory: Normal respiratory effort, no problems with respiration noted  Abdomen: Soft, gravid, appropriate for gestational age. Pain/Pressure: Present     Pelvic:  Cervical exam deferred Dilation: 2.5 Effacement (%): 50 Station: -3  Extremities: Normal range of motion.     ental Status: Normal mood and affect. Normal behavior. Normal judgment and  thought content.     Assessment   24 y.o. G3P1011 at [redacted]w[redacted]d by  12/04/2020, by Last Menstrual Period presenting for routine prenatal visit  Plan   THIRD Problems (from 07/06/20 to present)    Problem Noted Resolved   Supervision of other normal pregnancy, antepartum 07/06/2020 by Mirna Mires, CNM No   Overview Addendum 11/17/2020  1:46 PM by Natale Milch, MD     Clinic  [facility] Prenatal Labs  Dating  LMP Blood type: O/Positive/-- (10/12 1508) )+  Genetic Screen 1 Screen:    AFP:     Quad:     NIPS:Maternt neg, female Antibody:Negative (10/12 1508)negative  Anatomic [place] Normal female Rubella: <0.90 (10/12 1508)non immune  GTT Early:  76             Third trimester: 09/15/20-76 RPR: Non Reactive (10/12 1508) NR  Flu vaccine declines HBsAg: Negative (10/12 1508) negative  TDaP vaccine          09/15/20                 Rhogam: n/a HIV: Non Reactive (10/12 1508) NR  Baby Food   breastfeed                            GBS:  negative  Contraception  depo Pap: NILM  Circumcision    Pediatrician  Covid vaccination: encouraged  Support Person              Previous Version       Gestational age appropriate obstetric precautions including but not limited to vaginal bleeding, contractions, leaking of fluid and fetal movement were reviewed in detail with the patient.    IOL 12/07/2020 at [redacted]w[redacted]d  Return if symptoms worsen or fail to improve.  Carmel Sacramento  Bonney Aid, MD, [facility], [facility] 11/30/2020, 11:30 AM

## 2020-12-01 ENCOUNTER — Telehealth: Payer: Self-pay

## 2020-12-01 ENCOUNTER — Encounter: Payer: Self-pay | Admitting: Advanced Practice Midwife

## 2020-12-01 ENCOUNTER — Ambulatory Visit (INDEPENDENT_AMBULATORY_CARE_PROVIDER_SITE_OTHER): Payer: Medicaid Other | Admitting: Advanced Practice Midwife

## 2020-12-01 VITALS — BP 110/70 | Wt 228.0 lb

## 2020-12-01 DIAGNOSIS — Z3A39 39 weeks gestation of pregnancy: Secondary | ICD-10-CM

## 2020-12-01 DIAGNOSIS — Z3483 Encounter for supervision of other normal pregnancy, third trimester: Secondary | ICD-10-CM

## 2020-12-01 NOTE — Progress Notes (Signed)
Routine Prenatal Care Visit  Subjective  Heather [name] is a 25 y.o. G3P1011 at [redacted]w[redacted]d being seen today for ongoing prenatal care.  She is currently monitored for the following issues for this low-risk pregnancy and has Family history of breast cancer in female; Acne; Seizure (HCC); Gestational hypertension; Herpes; Eczema; Supervision of other normal pregnancy, antepartum; Late prenatal care; Marijuana user; Abdominal pain during pregnancy in third trimester; and Pregnancy on their problem list.  ----------------------------------------------------------------------------------- Patient reports thin white fluid leaking overnight after getting up to use the bathroom. She placed a towel under her which was damp this morning. She also felt fluid on her legs when she got out of bed this morning.   Contractions: Irregular. Vag. Bleeding: None.  Movement: Present. Leaking Fluid admits to.  ----------------------------------------------------------------------------------- The following portions of the patient's history were reviewed and updated as appropriate: allergies, current medications, past family history, past medical history, past social history, past surgical history and problem list. Problem list updated.  Objective  Blood pressure 110/70, weight 228 lb (103.4 kg), last menstrual period 02/28/2020 Pregravid weight 157 lb (71.2 kg) Total Weight Gain 71 lb (32.2 kg) Urinalysis: Urine Protein    Urine Glucose    Fetal Status:     Movement: Present     General:  Alert, oriented and cooperative. Patient is in no acute distress.  Skin: Skin is warm and dry. No rash noted.   Cardiovascular: Normal heart rate noted  Respiratory: Normal respiratory effort, no problems with respiration noted  Abdomen: Soft, gravid, appropriate for gestational age. Pain/Pressure: Present     Pelvic:  pooling negative, nitrazine negative, introitus dry        Extremities: Normal range of motion.     Mental  Status: Normal mood and affect. Normal behavior. Normal judgment and thought content.   Assessment   24 y.o. G3P1011 at [redacted]w[redacted]d by  12/04/2020, by Last Menstrual Period presenting for work-in prenatal visit  Plan   THIRD Problems (from 07/06/20 to present)    Problem Noted Resolved   Supervision of other normal pregnancy, antepartum 07/06/2020 by Mirna Mires, CNM No   Overview Addendum 11/17/2020  1:46 PM by Natale Milch, MD     Clinic  [facility] Prenatal Labs  Dating  LMP Blood type: O/Positive/-- (10/12 1508) )+  Genetic Screen 1 Screen:    AFP:     Quad:     NIPS:Maternt neg, female Antibody:Negative (10/12 1508)negative  Anatomic [place] Normal female Rubella: <0.90 (10/12 1508)non immune  GTT Early:  76             Third trimester: 09/15/20-76 RPR: Non Reactive (10/12 1508) NR  Flu vaccine declines HBsAg: Negative (10/12 1508) negative  TDaP vaccine          09/15/20                 Rhogam: n/a HIV: Non Reactive (10/12 1508) NR  Baby Food   breastfeed                            GBS:  negative  Contraception  depo Pap: NILM  Circumcision    Pediatrician  Covid vaccination: encouraged  Support Person              Previous Version       Term labor symptoms and general obstetric precautions including but not limited to vaginal bleeding, contractions, leaking of fluid and fetal movement were  reviewed in detail with the patient.   IOL 12/07/20  Tresea Mall, CNM 12/01/2020 10:59 AM

## 2020-12-01 NOTE — Telephone Encounter (Signed)
Pt calling; 39 wks; leaking fluid; membranes have been stripped.  [phone]  Pt states ctxs are not that consistent; is unable to stay dry.  Adv to come in and be checked.  Pt was added to JEG's scheduled.

## 2020-12-04 ENCOUNTER — Telehealth: Payer: Self-pay

## 2020-12-04 ENCOUNTER — Other Ambulatory Visit
Admission: RE | Admit: 2020-12-04 | Discharge: 2020-12-04 | Disposition: A | Discharge status: Home / Self Care | Payer: Medicaid Other | Source: Ambulatory Visit | Attending: Obstetrics and Gynecology | Admitting: Obstetrics and Gynecology

## 2020-12-04 ENCOUNTER — Encounter: Payer: Self-pay | Admitting: Obstetrics & Gynecology

## 2020-12-04 ENCOUNTER — Observation Stay
Admission: EM | Admit: 2020-12-04 | Discharge: 2020-12-04 | Disposition: A | Discharge status: Home / Self Care | Payer: Medicaid Other | Attending: Obstetrics and Gynecology | Admitting: Obstetrics and Gynecology

## 2020-12-04 ENCOUNTER — Other Ambulatory Visit: Payer: Self-pay

## 2020-12-04 ENCOUNTER — Ambulatory Visit: Payer: Self-pay | Admitting: *Deleted

## 2020-12-04 DIAGNOSIS — Z20822 Contact with and (suspected) exposure to covid-19: Secondary | ICD-10-CM | POA: Insufficient documentation

## 2020-12-04 DIAGNOSIS — O99333 Smoking (tobacco) complicating pregnancy, third trimester: Secondary | ICD-10-CM | POA: Insufficient documentation

## 2020-12-04 DIAGNOSIS — F17210 Nicotine dependence, cigarettes, uncomplicated: Secondary | ICD-10-CM | POA: Diagnosis not present

## 2020-12-04 DIAGNOSIS — Z01812 Encounter for preprocedural laboratory examination: Secondary | ICD-10-CM | POA: Insufficient documentation

## 2020-12-04 DIAGNOSIS — O26893 Other specified pregnancy related conditions, third trimester: Secondary | ICD-10-CM | POA: Diagnosis present

## 2020-12-04 DIAGNOSIS — O99891 Other specified diseases and conditions complicating pregnancy: Secondary | ICD-10-CM | POA: Diagnosis not present

## 2020-12-04 DIAGNOSIS — Z3A40 40 weeks gestation of pregnancy: Secondary | ICD-10-CM | POA: Diagnosis not present

## 2020-12-04 DIAGNOSIS — M549 Dorsalgia, unspecified: Secondary | ICD-10-CM | POA: Diagnosis not present

## 2020-12-04 DIAGNOSIS — R109 Unspecified abdominal pain: Secondary | ICD-10-CM

## 2020-12-04 DIAGNOSIS — R102 Pelvic and perineal pain: Secondary | ICD-10-CM | POA: Insufficient documentation

## 2020-12-04 DIAGNOSIS — Z3480 Encounter for supervision of other normal pregnancy, unspecified trimester: Secondary | ICD-10-CM

## 2020-12-04 LAB — SARS CORONAVIRUS 2 (TAT 6-24 HRS): SARS Coronavirus 2: NEGATIVE

## 2020-12-04 NOTE — Telephone Encounter (Signed)
Call transferred by agent and call disconnected by patient Attempted to call patient back- no answer- voice mail not set up.

## 2020-12-04 NOTE — OB Triage Note (Signed)
Pt reports ctx since 8 am this morning. No LOF, No Vaginal bleeding.

## 2020-12-04 NOTE — Telephone Encounter (Signed)
Patient is 40 wks today. Has induction date of Monday, but in a lot of pain and unsure what to do. Cb#[id number]

## 2020-12-04 NOTE — Discharge Summary (Signed)
See final progress note. 

## 2020-12-04 NOTE — Telephone Encounter (Signed)
Spoke w/patient. She is having contractions. Her pain is constant, but contractions not consistent yet. She doesn't want to go and be sent home. Advised ok to go if she is unable to talk/walk thru pain. If more than 4 ctx in 1 hour, leaking fluid (advised on wearing panty liner for 1 hour if uncertain if fluid or urine). Denies current leaking of fluid. She used breast pump last night to help stimulate labor. She has been having ctx/pain since.

## 2020-12-04 NOTE — Progress Notes (Signed)
Went into patients room at 1656 , patient was getting dressed and stated that she was leaving. I tried to encourage patient to stay and she was adamant about leaving. Asked if I could get her to sign AMA paper and she said "No, I am just leaving"  Provider made aware.

## 2020-12-04 NOTE — Final Progress Note (Addendum)
Final Progress Note  Patient ID: Heather [name] MRN: [id number] DOB/AGE: [birth year] 22 y.o.  Admit date: 12/04/2020 Admitting provider: Zipporah Plants, CNM Discharge date: 12/04/2020   Admission Diagnoses:     Abdominal pain during pregnancy in third trimester     Pelvic pain affecting pregnancy in third trimester, antepartum  Discharge Diagnoses:  Active Problems:     Abdominal pain during pregnancy in third trimester     Pelvic pain affecting pregnancy in third trimester, antepartum  History of Present Illness: The patient is a 25 y.o. female G3P1011 at [redacted]w[redacted]d who presents for abdominal pain, back pain, and pelvic pain. Patient tearful, agitated, and increasingly angry throughout evaluation. Patient states she is "tired of coming to the hospital and being told she is not in pain, and to take tylenol." Patient reports she has told several other people what is going on and did not want to communicate what was going on when asked by this provider. Patient states she is experiencing the same pain she has been having for "weeks" and she doesn't know why she isn't dilating but something "needs to be done." When asked what part of her status motivated her to present at the hospital today, the patient responded "I'm here, aren't I?"  Past Medical History:  Diagnosis Date  . Seizures (HCC)    last seizure November 2019    Past Surgical History:  Procedure Laterality Date  . NO PAST SURGERIES    . NO PAST SURGERIES      No current facility-administered medications on file prior to encounter.   Current Outpatient Medications on File Prior to Encounter  Medication Sig Dispense Refill  . Prenatal Vit-Fe Fumarate-FA (PRENATAL VITAMIN) 27-0.8 MG TABS Take 1 tablet by mouth daily. 100 tablet 0    No Known Allergies  Social History   Socioeconomic History  . Marital status: Single    Spouse name: Not on file  . Number of children: 1  . Years of education: 5  . Highest education  level: High school graduate  Occupational History  . Occupation: RETAIL Investment banker, corporate: [id number]    Comment: FULL TIME  Tobacco Use  . Smoking status: Current Some Day Smoker    Types: Cigarettes    Last attempt to quit: 07/28/2018    Years since quitting: 2.3  . Smokeless tobacco: Never Used  . Tobacco comment: smokes one cigarette twice a day  Vaping Use  . Vaping Use: Never used  Substance and Sexual Activity  . Alcohol use: No  . Drug use: No  . Sexual activity: Yes    Birth control/protection: Injection    Comment: depo  Other Topics Concern  . Not on file  Social History Narrative  . Not on file   Social Determinants of Health   Financial Resource Strain: Not on file  Food Insecurity: Not on file  Transportation Needs: Not on file  Physical Activity: Not on file  Stress: Not on file  Social Connections: Not on file  Intimate Partner Violence: Not At Risk  . Fear of Current or Ex-Partner: No  . Emotionally Abused: No  . Physically Abused: No  . Sexually Abused: No    Family History  Problem Relation Age of Onset  . Bipolar disorder Mother   . OCD Mother   . Hypertension Father   . Hyperlipidemia Father   . Cancer Maternal Uncle        breast  . Cancer Maternal Grandmother  breast  . Hypertension Paternal Grandmother   . Hyperlipidemia Paternal Grandmother   . COPD Neg Hx   . Diabetes Neg Hx   . Heart disease Neg Hx   . Stroke Neg Hx      Review of Systems  Gastrointestinal: Positive for abdominal pain.  Genitourinary:       Pelvic pain  Musculoskeletal: Positive for back pain.     Physical Exam: BP 128/88   Pulse (!) 104   Temp 98.4 F (36.9 C) (Oral)   LMP 02/28/2020 (Approximate)   Physical Exam Constitutional:      Appearance: Normal appearance.  Genitourinary:     Genitourinary Comments: SVE: per nursing 2/50/-2  Neurological:     Mental Status: She is alert.  Psychiatric:     Comments: Agitated, angry, yelling  Vitals  reviewed.     Consults: None  Significant Findings/ Diagnostic Studies: patient declined lab evaluation  Procedures: NONSTRESS TEST INTERPRETATION  INDICATIONS: rule out uterine contractions FHR baseline: 125 RESULTS:  A NST procedure was performed with FHR monitoring and a normal baseline established, appropriate time of 20-40 minutes of evaluation, and accels >2 seen w 15x15 characteristics.  Results show a REACTIVE NST.   Hospital Course: The patient was placed in triage for observation. Patient extremely agitated during initial evaluation, frustrated that her cervix "was not dilating." Patient was on phone throughout conversation, refusing to make eye contact. Request made by provider for patient to engage in conversation and attempt to make a plan together to manage pain. Discussed options for evaluation for etiology of abdominal pain, back pain, and pelvis pain (including testing to r/o UTI or vaginitis.) Patient stated "everything had already been tested." Patient became acutely agitated when discussing limitations of UA at recent office visit for evaluating for UTI. Offered potential option of morphine rest to patient - patient stated she didn't "want any medication" and thought she could only take tylenol. Patient declined to hear options for further pain management. Patient is currently scheduled for eIOL on 12/07/20. Patient requested that this provider leave room. Patient then removed herself from the [facility]. She declined AMA paperwork offered by nursing and left the facility without additional evaluation.  Discharge Condition: AMA - patient stable on initial evaluation  Disposition:  There are no questions and answers to display.        Total time spent taking care of this patient: 10 minutes  Signed:  Zipporah Plants, CNM 12/04/2020, 5:24 PM

## 2020-12-07 ENCOUNTER — Inpatient Hospital Stay
Admission: EM | Admit: 2020-12-07 | Discharge: 2020-12-08 | DRG: 806 | Disposition: A | Discharge status: Home / Self Care | Payer: Medicaid Other | Attending: Obstetrics and Gynecology | Admitting: Obstetrics and Gynecology

## 2020-12-07 ENCOUNTER — Encounter: Payer: Self-pay | Admitting: Obstetrics and Gynecology

## 2020-12-07 ENCOUNTER — Other Ambulatory Visit: Payer: Self-pay

## 2020-12-07 ENCOUNTER — Inpatient Hospital Stay: Payer: Medicaid Other | Admitting: Anesthesiology

## 2020-12-07 DIAGNOSIS — O134 Gestational [pregnancy-induced] hypertension without significant proteinuria, complicating childbirth: Principal | ICD-10-CM | POA: Diagnosis present

## 2020-12-07 DIAGNOSIS — D62 Acute posthemorrhagic anemia: Secondary | ICD-10-CM | POA: Diagnosis not present

## 2020-12-07 DIAGNOSIS — O26893 Other specified pregnancy related conditions, third trimester: Secondary | ICD-10-CM | POA: Diagnosis present

## 2020-12-07 DIAGNOSIS — Z23 Encounter for immunization: Secondary | ICD-10-CM | POA: Diagnosis not present

## 2020-12-07 DIAGNOSIS — O9081 Anemia of the puerperium: Secondary | ICD-10-CM | POA: Diagnosis not present

## 2020-12-07 DIAGNOSIS — F17210 Nicotine dependence, cigarettes, uncomplicated: Secondary | ICD-10-CM | POA: Diagnosis present

## 2020-12-07 DIAGNOSIS — F1290 Cannabis use, unspecified, uncomplicated: Secondary | ICD-10-CM | POA: Diagnosis present

## 2020-12-07 DIAGNOSIS — Z20822 Contact with and (suspected) exposure to covid-19: Secondary | ICD-10-CM | POA: Diagnosis present

## 2020-12-07 DIAGNOSIS — O480 Post-term pregnancy: Secondary | ICD-10-CM | POA: Diagnosis not present

## 2020-12-07 DIAGNOSIS — Z3A40 40 weeks gestation of pregnancy: Secondary | ICD-10-CM | POA: Diagnosis not present

## 2020-12-07 DIAGNOSIS — O99324 Drug use complicating childbirth: Secondary | ICD-10-CM | POA: Diagnosis present

## 2020-12-07 DIAGNOSIS — O99334 Smoking (tobacco) complicating childbirth: Secondary | ICD-10-CM | POA: Diagnosis present

## 2020-12-07 LAB — RPR: RPR Ser Ql: NONREACTIVE

## 2020-12-07 LAB — COMPREHENSIVE METABOLIC PANEL
ALT: 12 U/L (ref 0–44)
AST: 25 U/L (ref 15–41)
Albumin: 2.6 g/dL — ABNORMAL LOW (ref 3.5–5.0)
Alkaline Phosphatase: 148 U/L — ABNORMAL HIGH (ref 38–126)
Anion gap: 9 (ref 5–15)
BUN: 9 mg/dL (ref 6–20)
CO2: 21 mmol/L — ABNORMAL LOW (ref 22–32)
Calcium: 9.3 mg/dL (ref 8.9–10.3)
Chloride: 107 mmol/L (ref 98–111)
Creatinine, Ser: 0.6 mg/dL (ref 0.44–1.00)
GFR, Estimated: >60 mL/min (ref >60)
Glucose, Bld: 104 mg/dL — ABNORMAL HIGH (ref 70–99)
Potassium: 3.9 mmol/L (ref 3.5–5.1)
Sodium: 137 mmol/L (ref 135–145)
Total Bilirubin: 0.3 mg/dL (ref 0.3–1.2)
Total Protein: 6.3 g/dL — ABNORMAL LOW (ref 6.5–8.1)

## 2020-12-07 LAB — TYPE AND SCREEN
ABO/RH(D): O POS
Antibody Screen: NEGATIVE

## 2020-12-07 LAB — CBC
HCT: 37.8 % (ref 36.0–46.0)
Hemoglobin: 12.1 g/dL (ref 12.0–15.0)
MCH: 28.4 pg (ref 26.0–34.0)
MCHC: 32 g/dL (ref 30.0–36.0)
MCV: 88.7 fL (ref 80.0–100.0)
Platelets: 227 10*3/uL (ref 150–400)
RBC: 4.26 MIL/uL (ref 3.87–5.11)
RDW: 14.4 % (ref 11.5–15.5)
WBC: 13.5 10*3/uL — ABNORMAL HIGH (ref 4.0–10.5)
nRBC: 0 % (ref 0.0–0.2)

## 2020-12-07 LAB — PROTEIN / CREATININE RATIO, URINE
Creatinine, Urine: 75 mg/dL
Protein Creatinine Ratio: 0.16 mg/mg{Cre} — ABNORMAL HIGH (ref 0.00–0.15)
Total Protein, Urine: 12 mg/dL

## 2020-12-07 MED ORDER — ACETAMINOPHEN 325 MG PO TABS
650.0000 mg | ORAL_TABLET | ORAL | Status: DC | PRN
  Administered 2020-12-07 – 2020-12-08 (×3): 650 mg via ORAL
  Filled 2020-12-07 (×3): qty 2

## 2020-12-07 MED ORDER — LACTATED RINGERS IV SOLN: 500.0000 mL | Freq: Once | INTRAVENOUS | Status: DC

## 2020-12-07 MED ORDER — DIPHENHYDRAMINE HCL 25 MG PO CAPS: 25.0000 mg | ORAL_CAPSULE | Freq: Four times a day (QID) | ORAL | Status: DC | PRN

## 2020-12-07 MED ORDER — OXYTOCIN 10 UNIT/ML IJ SOLN
INTRAMUSCULAR | Status: AC
  Filled 2020-12-07: qty 2

## 2020-12-07 MED ORDER — LIDOCAINE-EPINEPHRINE (PF) 1.5 %-1:200000 IJ SOLN
INTRAMUSCULAR | Status: DC | PRN
  Administered 2020-12-07: 3 mL via PERINEURAL

## 2020-12-07 MED ORDER — EPHEDRINE 5 MG/ML INJ: 10.0000 mg | INTRAVENOUS | Status: DC | PRN

## 2020-12-07 MED ORDER — BUTORPHANOL TARTRATE 1 MG/ML IJ SOLN
1.0000 mg | INTRAMUSCULAR | Status: DC | PRN
  Administered 2020-12-07: 1 mg via INTRAVENOUS
  Filled 2020-12-07: qty 1

## 2020-12-07 MED ORDER — BENZOCAINE-MENTHOL 20-0.5 % EX AERO
1.0000 "application " | INHALATION_SPRAY | CUTANEOUS | Status: DC | PRN
  Filled 2020-12-07: qty 56

## 2020-12-07 MED ORDER — LIDOCAINE HCL (PF) 1 % IJ SOLN: 30.0000 mL | INTRAMUSCULAR | Status: DC | PRN

## 2020-12-07 MED ORDER — DIPHENHYDRAMINE HCL 50 MG/ML IJ SOLN: 12.5000 mg | INTRAMUSCULAR | Status: DC | PRN

## 2020-12-07 MED ORDER — ONDANSETRON HCL 4 MG PO TABS
4.0000 mg | ORAL_TABLET | ORAL | Status: DC | PRN
  Filled 2020-12-07: qty 1

## 2020-12-07 MED ORDER — MISOPROSTOL 200 MCG PO TABS
ORAL_TABLET | ORAL | Status: AC
  Filled 2020-12-07: qty 4

## 2020-12-07 MED ORDER — SIMETHICONE 80 MG PO CHEW
80.0000 mg | CHEWABLE_TABLET | ORAL | Status: DC | PRN
Start: 2020-12-07 — End: 2020-12-08

## 2020-12-07 MED ORDER — DIBUCAINE (PERIANAL) 1 % EX OINT: 1.0000 "application " | TOPICAL_OINTMENT | CUTANEOUS | Status: DC | PRN

## 2020-12-07 MED ORDER — PHENYLEPHRINE 40 MCG/ML (10ML) SYRINGE FOR IV PUSH (FOR BLOOD PRESSURE SUPPORT): 80.0000 ug | PREFILLED_SYRINGE | INTRAVENOUS | Status: DC | PRN

## 2020-12-07 MED ORDER — BUPIVACAINE HCL (PF) 0.25 % IJ SOLN
INTRAMUSCULAR | Status: DC | PRN
  Administered 2020-12-07: 7 mL via EPIDURAL

## 2020-12-07 MED ORDER — TETANUS-DIPHTH-ACELL PERTUSSIS 5-2.5-18.5 LF-MCG/0.5 IM SUSY: 0.5000 mL | PREFILLED_SYRINGE | Freq: Once | INTRAMUSCULAR | Status: DC

## 2020-12-07 MED ORDER — ONDANSETRON HCL 4 MG/2ML IJ SOLN: 4.0000 mg | Freq: Four times a day (QID) | INTRAMUSCULAR | Status: DC | PRN

## 2020-12-07 MED ORDER — LACTATED RINGERS IV SOLN
500.0000 mL | INTRAVENOUS | Status: DC | PRN
  Administered 2020-12-07: 250 mL via INTRAVENOUS

## 2020-12-07 MED ORDER — COCONUT OIL OIL
1.0000 "application " | TOPICAL_OIL | Status: DC | PRN
  Filled 2020-12-07: qty 120

## 2020-12-07 MED ORDER — OXYTOCIN-SODIUM CHLORIDE 30-0.9 UT/500ML-% IV SOLN
2.5000 [IU]/h | INTRAVENOUS | Status: DC
  Filled 2020-12-07: qty 500

## 2020-12-07 MED ORDER — AMMONIA AROMATIC IN INHA
RESPIRATORY_TRACT | Status: AC
  Filled 2020-12-07: qty 10

## 2020-12-07 MED ORDER — SENNOSIDES-DOCUSATE SODIUM 8.6-50 MG PO TABS
2.0000 | ORAL_TABLET | Freq: Every day | ORAL | Status: DC
  Administered 2020-12-08: 2 via ORAL
  Filled 2020-12-07: qty 2

## 2020-12-07 MED ORDER — IBUPROFEN 600 MG PO TABS
600.0000 mg | ORAL_TABLET | Freq: Four times a day (QID) | ORAL | Status: DC
  Administered 2020-12-07 – 2020-12-08 (×5): 600 mg via ORAL
  Filled 2020-12-07 (×5): qty 1

## 2020-12-07 MED ORDER — FENTANYL 2.5 MCG/ML W/ROPIVACAINE 0.15% IN NS 100 ML EPIDURAL (ARMC): 12.0000 mL/h | EPIDURAL | Status: DC

## 2020-12-07 MED ORDER — ACETAMINOPHEN 325 MG PO TABS: 650.0000 mg | ORAL_TABLET | ORAL | Status: DC | PRN

## 2020-12-07 MED ORDER — OXYTOCIN BOLUS FROM INFUSION
333.0000 mL | Freq: Once | INTRAVENOUS | Status: DC
  Administered 2020-12-07: 333 mL via INTRAVENOUS

## 2020-12-07 MED ORDER — LACTATED RINGERS IV SOLN: INTRAVENOUS | Status: DC

## 2020-12-07 MED ORDER — ONDANSETRON HCL 4 MG/2ML IJ SOLN: 4.0000 mg | INTRAMUSCULAR | Status: DC | PRN

## 2020-12-07 MED ORDER — PRENATAL MULTIVITAMIN CH
1.0000 | ORAL_TABLET | Freq: Every day | ORAL | Status: DC
  Administered 2020-12-07 – 2020-12-08 (×2): 1 via ORAL
  Filled 2020-12-07 (×2): qty 1

## 2020-12-07 MED ORDER — LIDOCAINE HCL (PF) 1 % IJ SOLN
INTRAMUSCULAR | Status: AC
  Filled 2020-12-07: qty 30

## 2020-12-07 MED ORDER — LIDOCAINE HCL (PF) 1 % IJ SOLN
INTRAMUSCULAR | Status: DC | PRN
  Administered 2020-12-07: 3 mL

## 2020-12-07 MED ORDER — WITCH HAZEL-GLYCERIN EX PADS
1.0000 | MEDICATED_PAD | CUTANEOUS | Status: DC | PRN
Start: 2020-12-07 — End: 2020-12-08

## 2020-12-07 MED ORDER — FENTANYL 2.5 MCG/ML W/ROPIVACAINE 0.15% IN NS 100 ML EPIDURAL (ARMC)
EPIDURAL | Status: AC
  Filled 2020-12-07: qty 100

## 2020-12-07 NOTE — Lactation Note (Signed)
This note was copied from a baby's chart. Lactation Consultation Note  Patient Name: Heather [name] MVEHM'C Date: 12/07/2020 Reason for consult: Initial assessment;1st time breastfeeding;Term Age:23 hours  Initial lactation visit. This is mom's second child, her other is 19yr old, no prior BF experience.  LC asked about mom's feeding desire, she mentioned breastfeeding but gave bottle early after delivery. LC discussed with mom the impacts that this can have on baby's desire to eat in the next few hours, however we can still work with baby with breastfeeding.  LC educated mom on infant stomach size, feeding patterns, and early cues. Educated on hand expression, and encouraged offering of breast every 2-3 hours and/or hand expression if baby is not interested to help encourage the onset of an adequate milk production.  Mom is open to feeding support/breastfeeding support. Mom placed baby skin to skin and was encouraged to call when baby started to show early feeding cues.  Maternal Data Formula Feeding for Exclusion: Yes Reason for exclusion: Mother's choice to formula and breast feed on admission Has patient been taught Hand Expression?: Yes Does the patient have breastfeeding experience prior to this delivery?: No (did not breastfeed her 23yr old)  Feeding    LATCH Score                   Interventions Interventions: Breast feeding basics reviewed;Hand express (early cues)  Lactation Tools Discussed/Used     Consult Status Consult Status: Follow-up Date: 12/07/20 Follow-up type: In-patient    [name] 12/07/2020, 2:00 PM

## 2020-12-07 NOTE — Anesthesia Preprocedure Evaluation (Signed)
Anesthesia Evaluation  Patient identified by MRN, date of birth, ID band Patient awake    Reviewed: Allergy & Precautions, NPO status , Patient's Chart, lab work & pertinent test results  Airway Mallampati: II  TM Distance: >3 FB     Dental  (+) Teeth Intact   Pulmonary Current Smoker, former smoker,    Pulmonary exam normal        Cardiovascular hypertension, Normal cardiovascular exam     Neuro/Psych Seizures -,  negative psych ROS   GI/Hepatic negative GI ROS, Neg liver ROS,   Endo/Other  negative endocrine ROS  Renal/GU negative Renal ROS  negative genitourinary   Musculoskeletal negative musculoskeletal ROS (+)   Abdominal Normal abdominal exam  (+)   Peds negative pediatric ROS (+)  Hematology negative hematology ROS (+)   Anesthesia Other Findings Past Medical History: No date: Seizures (HCC)     Comment:  last seizure November 2019  Reproductive/Obstetrics (+) Pregnancy                             Anesthesia Physical  Anesthesia Plan  ASA: II  Anesthesia Plan: Epidural   Post-op Pain Management:    Induction:   PONV Risk Score and Plan:   Airway Management Planned: Natural Airway  Additional Equipment:   Intra-op Plan:   Post-operative Plan:   Informed Consent: I have reviewed the patients History and Physical, chart, labs and discussed the procedure including the risks, benefits and alternatives for the proposed anesthesia with the patient or authorized representative who has indicated his/her understanding and acceptance.     Dental advisory given  Plan Discussed with: CRNA and Surgeon  Anesthesia Plan Comments:         Anesthesia Quick Evaluation

## 2020-12-07 NOTE — Discharge Summary (Signed)
OB Discharge Summary     Patient Name: Heather [name] DOB: November 10, 1995 MRN: [id number]  Date of admission: 12/07/2020 Delivering provider: Tresea Mall, CNM  Date of Delivery: 12/07/2020  Date of discharge: 12/08/2020  Admitting diagnosis: Labor and delivery, indication for care [O75.9] Intrauterine pregnancy: [redacted]w[redacted]d     Secondary diagnosis: Gestational Hypertension     Discharge diagnosis: Term Pregnancy Delivered                                                                                                Post partum procedures: none  Augmentation: N/A  Complications: None  Hospital course:  Onset of Labor With Vaginal Delivery      25 y.o. yo G3P1011 at [redacted]w[redacted]d was admitted in Active Labor on 12/07/2020. Patient had an uncomplicated labor course as follows:  Membrane Rupture Time/Date: 3:39 AM ,12/07/2020   Delivery Method:Vaginal, Spontaneous  Episiotomy: None  Lacerations:  None  Patient had an uncomplicated postpartum course.  She is tolerating regular diet. Her pain is controlled with PO medication. She is ambulating and voiding without difficulty.   Patient is discharged home in stable condition on 12/08/20.  Newborn Data: Birth date:12/07/2020  Birth time:7:20 AM  Gender:Female  Roman Living status:Living  Apgars:9 ,9  Weight: 3690 g, 8 pounds 2 ounces  Physical exam  Vitals:   12/07/20 1619 12/07/20 1936 12/07/20 2311 12/08/20 0749  BP: 122/69 119/71 133/79 122/82  Pulse: 86 95 88 70  Resp: 18 18 18 18   Temp: 98.4 F (36.9 C) 98.1 F (36.7 C) 98.4 F (36.9 C) 98 F (36.7 C)  TempSrc: Oral Oral Oral Oral  SpO2: 100% 96% 99% 99%  Weight:      Height:       General: alert, cooperative and no distress Lochia: appropriate Uterine Fundus: firm Incision: N/A DVT Evaluation: No evidence of DVT seen on physical exam. No significant calf/ankle edema.  Labs: Lab Results  Component Value Date   WBC 14.3 (H) 12/08/2020   HGB 10.9 (L) 12/08/2020   HCT 33.6  (L) 12/08/2020   MCV 89.1 12/08/2020   PLT 252 12/08/2020    Discharge instruction: per After Visit Summary.  Medications:  Allergies as of 12/08/2020   No Known Allergies     Medication List    TAKE these medications   acetaminophen 325 MG tablet Commonly known as: Tylenol Take 2 tablets (650 mg total) by mouth every 4 (four) hours as needed (for pain scale < 4).   ibuprofen 600 MG tablet Commonly known as: ADVIL Take 1 tablet (600 mg total) by mouth every 6 (six) hours.   Prenatal Vitamin 27-0.8 MG Tabs Take 1 tablet by mouth daily.       Diet: home with mother  Activity: Advance as tolerated. Pelvic rest for 6 weeks.   Outpatient follow up:  Follow-up Information    02/05/2021, CNM. Schedule an appointment as soon as possible for a visit in 6 week(s).   Specialty: Obstetrics Why: postpartum follow up Contact information: [address] [place] [phone]  Postpartum contraception: Depo Provera - patient declined during hospital admission -plan for administration at 6 week PP visit Rhogam Given postpartum: NA Rubella vaccine given postpartum: yes Varicella vaccine given postpartum: yes TDaP given antepartum or postpartum: UTD - 09/15/20    Newborn Delivery   Birth date/time: 12/07/2020 07:20:00 Delivery type: Vaginal, Spontaneous       Baby Feeding: Formula  Disposition:home with mother  SIGNED:  Zipporah Plants, CNM 12/08/2020 10:26 AM

## 2020-12-07 NOTE — H&P (Signed)
OB History & Physical   History of Present Illness:  Chief Complaint: water broke, contractions  HPI:  Heather [name] is a 25 y.o. G32P1011 female at [redacted]w[redacted]d dated by LMP.  Her pregnancy has been complicated by Seizure [facility]); Gestational hypertension; Herpes; Eczema; Supervision of other normal pregnancy, antepartum; Late prenatal care; Marijuana user.    She reports contractions.   She reports leakage of fluid since 3:30 this morning.   She denies vaginal bleeding.   She reports fetal movement.    Total weight gain for pregnancy: 32.2 kg   Obstetrical Problem List: THIRD Problems (from 07/06/20 to present)    Problem Noted Resolved   Pelvic pain affecting pregnancy in third trimester, antepartum 12/04/2020 by Zipporah Plants, CNM No   Supervision of other normal pregnancy, antepartum 07/06/2020 by Mirna Mires, CNM No   Overview Addendum 11/17/2020  1:46 PM by Natale Milch, MD     Clinic  [facility] Prenatal Labs  Dating  LMP Blood type: O/Positive/-- (10/12 1508) )+  Genetic Screen 1 Screen:    AFP:     Quad:     NIPS:Maternt neg, female Antibody:Negative (10/12 1508)negative  Anatomic [place] Normal female Rubella: <0.90 (10/12 1508)non immune  GTT Early:  76             Third trimester: 09/15/20-76 RPR: Non Reactive (10/12 1508) NR  Flu vaccine declines HBsAg: Negative (10/12 1508) negative  TDaP vaccine          09/15/20                 Rhogam: n/a HIV: Non Reactive (10/12 1508) NR  Baby Food   breastfeed                            GBS:  negative  Contraception  depo Pap: NILM  Circumcision    Pediatrician  Covid vaccination: encouraged  Support Person              Previous Version       Maternal Medical History:   Past Medical History:  Diagnosis Date  . Seizures (HCC)    last seizure November 2019    Past Surgical History:  Procedure Laterality Date  . NO PAST SURGERIES    . NO PAST SURGERIES      No Known Allergies  Prior to Admission medications    Medication Sig Start Date End Date Taking? Authorizing Provider  Prenatal Vit-Fe Fumarate-FA (PRENATAL VITAMIN) 27-0.8 MG TABS Take 1 tablet by mouth daily. 04/22/20  Yes Federico Flake, MD    OB History  Gravida Para Term Preterm AB Living  3 1 1  0 1 1  SAB IAB Ectopic Multiple Live Births  1 0 0 0 1    # Outcome Date GA Lbr Len/2nd Weight Sex Delivery Anes PTL Lv  3 Current           2 Term 03/30/19 [redacted]w[redacted]d  2892 g M Vag-Spont   LIV  1 SAB 10/04/16            Prenatal care site: [facility]  Social History: She  reports that she has been smoking cigarettes. She has never used smokeless tobacco. She reports that she does not drink alcohol and does not use drugs.  Family History: family history includes Bipolar disorder in her mother; Cancer in her maternal grandmother and maternal uncle; Hyperlipidemia in her father and paternal grandmother; Hypertension in  her father and paternal grandmother; OCD in her mother.    Review of Systems:  Review of Systems  Constitutional: Negative for chills and fever.  HENT: Negative for congestion, ear discharge, ear pain, hearing loss, sinus pain and sore throat.   Eyes: Negative for blurred vision and double vision.  Respiratory: Negative for cough, shortness of breath and wheezing.   Cardiovascular: Negative for chest pain, palpitations and leg swelling.  Gastrointestinal: Negative for abdominal pain, blood in stool, constipation, diarrhea, heartburn, melena, nausea and vomiting.  Genitourinary: Negative for dysuria, flank pain, frequency, hematuria and urgency.  Musculoskeletal: Negative for back pain, joint pain and myalgias.  Skin: Negative for itching and rash.  Neurological: Negative for dizziness, tingling, tremors, sensory change, speech change, focal weakness, seizures, loss of consciousness, weakness and headaches.  Endo/Heme/Allergies: Negative for environmental allergies. Does not bruise/bleed easily.   Psychiatric/Behavioral: Negative for depression, hallucinations, memory loss, substance abuse and suicidal ideas. The patient is not nervous/anxious and does not have insomnia.      Physical Exam:  BP 123/78 (BP Location: Right Arm)   Pulse (!) 101   Temp 98 F (36.7 C) (Oral)   Resp 18   Ht 5\' 6"  (1.676 m)   Wt 103.9 kg   LMP 02/28/2020 (Approximate)   SpO2 100%   BMI 36.96 kg/m   Constitutional: Well nourished, well developed female in no acute distress.  HEENT: normal Skin: Warm and dry.  Cardiovascular: Regular rate and rhythm.   Extremity: no edema  Respiratory: Clear to auscultation bilateral. Normal respiratory effort Abdomen: FHT present Back: no CVAT Neuro: DTRs 2+, Cranial nerves grossly intact Psych: Alert and Oriented x3. No memory deficits. Normal mood and affect.  MS: normal gait, normal bilateral lower extremity ROM/strength/stability.  Pelvic exam: 3.5 cm on admission per RN, complete at 7:05 AM  No herpes lesions on exam at time of delivery  Baseline FHR: 115 beats/min   Variability: moderate   Accelerations: present   Decelerations: absent Contractions: present frequency: every 2-3 minutes Overall assessment: reassuring   Lab Results  Component Value Date   SARSCOV2NAA NEGATIVE 12/04/2020  ]  Assessment:  Heather [name] is a 25 y.o. G57P1011 female at [redacted]w[redacted]d with active labor on admission, SROM.   Plan:  1. Admit to Labor & Delivery  2. CBC, T&S, Clrs, IVF 3. GBS negative.   4. Fetal well-being: Category I 5. Expectant management   [redacted]w[redacted]d, [facility] 12/07/2020 7:33 AM

## 2020-12-07 NOTE — Anesthesia Procedure Notes (Signed)
Epidural Patient location during procedure: OB Start time: 12/07/2020 6:41 AM End time: 12/07/2020 6:52 AM  Staffing Anesthesiologist: Yves Dill, MD Performed: anesthesiologist   Preanesthetic Checklist Completed: patient identified, IV checked, site marked, risks and benefits discussed, surgical consent, monitors and equipment checked, pre-op evaluation and timeout performed  Epidural Patient position: sitting Prep: Betadine and ChloraPrep Patient monitoring: heart rate, continuous pulse ox and blood pressure Approach: midline Location: L3-L4 Injection technique: LOR air  Needle:  Needle type: Tuohy  Needle gauge: 17 G Needle length: 9 cm and 9 Catheter type: closed end flexible Catheter size: 19 Gauge Test dose: negative and 1.5% lidocaine with Epi 1:200 K  Assessment Events: blood not aspirated, injection not painful, no injection resistance, no paresthesia and negative IV test  Additional Notes Time out called.  Patient placed in sitting position.  Back prepped and draped in sterile fashion.  A skin wheal was made in the L3-L4 interspace with 1% Lidocaine plain.  A 17G Tuohy needle was advanced into the epidural space by a loss of resistance technique.  The epidural catheter was threaded 3 cm and the TD was negative .  The patient tolerated the procedure well and the catheter was affixed to the back in sterile fashion.  No paresthesias, blood or fluid.Reason for block:procedure for pain

## 2020-12-08 LAB — CBC
HCT: 33.6 % — ABNORMAL LOW (ref 36.0–46.0)
Hemoglobin: 10.9 g/dL — ABNORMAL LOW (ref 12.0–15.0)
MCH: 28.9 pg (ref 26.0–34.0)
MCHC: 32.4 g/dL (ref 30.0–36.0)
MCV: 89.1 fL (ref 80.0–100.0)
Platelets: 252 10*3/uL (ref 150–400)
RBC: 3.77 MIL/uL — ABNORMAL LOW (ref 3.87–5.11)
RDW: 14.4 % (ref 11.5–15.5)
WBC: 14.3 10*3/uL — ABNORMAL HIGH (ref 4.0–10.5)
nRBC: 0 % (ref 0.0–0.2)

## 2020-12-08 MED ORDER — IBUPROFEN 600 MG PO TABS: 600.0000 mg | ORAL_TABLET | Freq: Four times a day (QID) | ORAL | 0 refills | Status: DC

## 2020-12-08 MED ORDER — ACETAMINOPHEN 325 MG PO TABS: 650.0000 mg | ORAL_TABLET | ORAL | Status: DC | PRN

## 2020-12-08 MED ORDER — VARICELLA VIRUS VACCINE LIVE 1350 PFU/0.5ML IJ SUSR
0.5000 mL | INTRAMUSCULAR | Status: AC | PRN
  Administered 2020-12-08: 0.5 mL via SUBCUTANEOUS
  Filled 2020-12-08 (×2): qty 0.5

## 2020-12-08 MED ORDER — MEASLES, MUMPS & RUBELLA VAC IJ SOLR
0.5000 mL | INTRAMUSCULAR | Status: AC | PRN
  Administered 2020-12-08: 0.5 mL via SUBCUTANEOUS
  Filled 2020-12-08 (×2): qty 0.5

## 2020-12-08 NOTE — Lactation Note (Signed)
This note was copied from a baby's chart. Lactation Consultation Note  Patient Name: Heather [name] Date: 12/08/2020 Reason for consult: Follow-up assessment;Mother's request;Term Age:23 hours  Lactation follow-up. Mom did independently BF 1-2x overnight, also continued to give formula bottles. Mom interested this morning in attempted feed at breast. Mom had baby positioned well with support pillows for football hold and was doing well shaping breast and trying to encourage baby to latch. Baby was awake but calm and non-cuing. LC assisted with hand expression and attempted latching; baby on/off breast for 5 minutes. LC encouraged skin to skin and helped put baby on mom's chest, mom reported then that baby just received 46mL of formula about 1hr prior to attempted BF. LC discussed the impacts that this can have on his desire to continue coming to the breast and encouraged for today to offer breast before formula to help encourage breastfeeding. Mom open to education, BF assistance, and guidance.   Maternal Data Has patient been taught Hand Expression?: Yes Does the patient have breastfeeding experience prior to this delivery?: Yes How long did the patient breastfeed?: 3 months (mom said this morning she BF; previously said she didn't)  Feeding Mother's Current Feeding Choice: Breast Milk and Formula Nipple Type: Slow - flow  LATCH Score Latch: Repeated attempts needed to sustain latch, nipple held in mouth throughout feeding, stimulation needed to elicit sucking reflex.  Audible Swallowing: A few with stimulation (drops in mouth from hand expression)  Type of Nipple: Everted at rest and after stimulation  Comfort (Breast/Nipple): Soft / non-tender  Hold (Positioning): No assistance needed to correctly position infant at breast.  LATCH Score: 8   Lactation Tools Discussed/Used    Interventions Interventions: Breast feeding basics reviewed;Assisted with latch;Skin to  skin;Hand express;Support pillows  Discharge WIC Program: Yes  Consult Status Consult Status: Follow-up Date: 12/08/20 Follow-up type: In-patient    [name] 12/08/2020, 10:19 AM

## 2020-12-08 NOTE — TOC Initial Note (Addendum)
Transition of Care [facility]) - Initial/Assessment Note    Patient Details  Name: Heather [name] MRN: [id number] Date of Birth: 06-20-1996  Transition of Care [facility]) CM/SW Contact:    [place] Cellar, RN Phone Number: 12/08/2020, 1:30 PM  Clinical Narrative:                  Spoke to mother with infant. Patient reports no needs or concerns other than would like some additional ice packs for home. Patient reports she has a 23 year old at home. Has all needed equipment and supplies. Planning to breastfeed with formula as needed. Active with WIC and already has pediatrician selected. Strong family support including father of baby. No smoking in the home. All equipment obtained per mother including car seat. Patient reports 6-8 weeks of maternity leave and will return to work with family watching infant. NO history of anxiety or depression however aware of PPD and s/s to identify. Discussed importance of hand hygiene and possibility of CPS report if infant (+) Marijuana. Mother was positive for Marijuana. No needs or concerns. Will follow for cord blood results on infant. UDS on infant was negative.         Patient Goals and CMS Choice        Expected Discharge Plan and Services           Expected Discharge Date: 12/08/20                                    Prior Living Arrangements/Services                       Activities of Daily Living Home Assistive Devices/Equipment: None ADL Screening (condition at time of admission) Patient's cognitive ability adequate to safely complete daily activities?: Yes Is the patient deaf or have difficulty hearing?: No Does the patient have difficulty seeing, even when wearing glasses/contacts?: No Does the patient have difficulty concentrating, remembering, or making decisions?: No Patient able to express need for assistance with ADLs?: Yes Does the patient have difficulty dressing or bathing?: No Independently performs ADLs?: Yes  (appropriate for developmental age) Does the patient have difficulty walking or climbing stairs?: No Weakness of Legs: None Weakness of Arms/Hands: None  Permission Sought/Granted                  Emotional Assessment              Admission diagnosis:  Labor and delivery, indication for care [O75.9] Patient Active Problem List   Diagnosis Date Noted  . Labor and delivery, indication for care 12/07/2020  . Postpartum care following vaginal delivery 12/07/2020  . Pregnancy 11/22/2020  . Marijuana user 07/15/2020  . Supervision of other normal pregnancy, antepartum 07/06/2020  . Late prenatal care 07/06/2020  . Gestational hypertension 04/01/2019  . Seizure (HCC) 02/28/2018  . Herpes 05/24/2016  . Acne 12/06/2015  . Family history of breast cancer in female 11/30/2015  . Eczema 01/14/2014   PCP:  [facility]:   [id number] - [place], [place] - [address] [place] [address] Phone: [phone] Fax: [phone]     Social Determinants of Health (SDOH) Interventions    Readmission Risk Interventions No flowsheet data found.

## 2020-12-08 NOTE — Progress Notes (Addendum)
Obstetric Postpartum Daily Progress Note  Chief Complaint: Postpartum care after vaginal delivery  Subjective:  25 y.o. G3P1011 postpartum day #1 status post vaginal delivery.  She is ambulating, is tolerating po, is voiding spontaneously.  Her pain is well controlled on PO pain medications. Her lochia is less than menses.   Medications SCHEDULED MEDICATIONS  . ibuprofen  600 mg Oral Q6H  . prenatal multivitamin  1 tablet Oral Q1200  . senna-docusate  2 tablet Oral Daily  . Tdap  0.5 mL Intramuscular Once    MEDICATION INFUSIONS    PRN MEDICATIONS  acetaminophen, benzocaine-Menthol, coconut oil, witch hazel-glycerin **AND** dibucaine, diphenhydrAMINE, measles, mumps & rubella vaccine, ondansetron **OR** ondansetron (ZOFRAN) IV, simethicone, varicella virus vaccine live    Objective:   Vitals:   12/07/20 1619 12/07/20 1936 12/07/20 2311 12/08/20 0749  BP: 122/69 119/71 133/79 122/82  Pulse: 86 95 88 70  Resp: 18 18 18 18   Temp: 98.4 F (36.9 C) 98.1 F (36.7 C) 98.4 F (36.9 C) 98 F (36.7 C)  TempSrc: Oral Oral Oral Oral  SpO2: 100% 96% 99% 99%  Weight:      Height:        Current Vital Signs 24h Vital Sign Ranges  T 98 F (36.7 C) Temp  Avg: 98.3 F (36.8 C)  Min: 98 F (36.7 C)  Max: 98.6 F (37 C)  BP 122/82 BP  Min: 119/71  Max: 133/79  HR 70 Pulse  Avg: 84.8  Min: 70  Max: 95  RR 18 Resp  Avg: 18.3  Min: 18  Max: 20  SaO2 99 %   SpO2  Avg: 98.7 %  Min: 96 %  Max: 100 %       24 Hour I/O Current Shift I/O  Time Ins Outs 01/31 0701 - 02/01 0700 In: 167.5 [I.V.:167.5] Out: 1030 [Urine:800] No intake/output data recorded.  General: NAD Pulmonary: no increased work of breathing Abdomen: non-distended, non-tender, fundus firm at level of umbilicus Extremities: no edema, no erythema, no tenderness  Labs:  Recent Labs  Lab 12/07/20 0437 12/08/20 0223  WBC 13.5* 14.3*  HGB 12.1 10.9*  HCT 37.8 33.6*  PLT 227 252     Assessment:   25 y.o. G3P1011  postpartum day # 1 status post recovering well.  Plan:   1) Acute blood loss anemia - hemodynamically stable and asymptomatic  2) O POS /  Information for the patient's newborn:  [place], [place] Denice Bors  O POS   / Rubella <0.90 (10/12 1508)/ Varicella non-immune  3) TDAP status UTD - 09/15/20  4) breast and bottle /Contraception = Depo-Provera - patient declines during hospitalization, plan for administration at 6 week PP visit  5) Disposition - discharge today pending infant 23 hour screens and circ  13/9/21, CNM 12/08/2020 9:42 AM

## 2020-12-08 NOTE — Anesthesia Postprocedure Evaluation (Signed)
Anesthesia Post Note  Patient: Heather [name]  Procedure(s) Performed: AN AD HOC LABOR EPIDURAL  Patient location during evaluation: Mother Baby Anesthesia Type: Epidural Level of consciousness: awake, awake and alert, oriented and patient cooperative Pain management: pain level controlled Vital Signs Assessment: post-procedure vital signs reviewed and stable Respiratory status: spontaneous breathing and nonlabored ventilation Cardiovascular status: stable Postop Assessment: no headache, no backache, patient able to bend at knees, no apparent nausea or vomiting, able to ambulate and adequate PO intake Anesthetic complications: no   No complications documented.   Last Vitals:  Vitals:   12/07/20 1936 12/07/20 2311  BP: 119/71 133/79  Pulse: 95 88  Resp: 18 18  Temp: 36.7 C 36.9 C  SpO2: 96% 99%    Last Pain:  Vitals:   12/08/20 0034  TempSrc:   PainSc: 8                  Christphor Groft,  Sandor Arboleda R

## 2020-12-08 NOTE — Discharge Instructions (Signed)

## 2020-12-08 NOTE — Progress Notes (Signed)
Mother discharged.  Discharge instructions given.  Mother verbalizes understanding.  Transported by auxiliary.  

## 2021-05-05 ENCOUNTER — Ambulatory Visit (LOCAL_COMMUNITY_HEALTH_CENTER): Payer: Medicaid Other | Admitting: Advanced Practice Midwife

## 2021-05-05 ENCOUNTER — Other Ambulatory Visit: Payer: Self-pay

## 2021-05-05 VITALS — BP 107/62 | Ht 65.0 in | Wt 180.4 lb

## 2021-05-05 DIAGNOSIS — G43909 Migraine, unspecified, not intractable, without status migrainosus: Secondary | ICD-10-CM | POA: Insufficient documentation

## 2021-05-05 DIAGNOSIS — Z30011 Encounter for initial prescription of contraceptive pills: Secondary | ICD-10-CM

## 2021-05-05 DIAGNOSIS — E669 Obesity, unspecified: Secondary | ICD-10-CM | POA: Insufficient documentation

## 2021-05-05 DIAGNOSIS — B9689 Other specified bacterial agents as the cause of diseases classified elsewhere: Secondary | ICD-10-CM

## 2021-05-05 DIAGNOSIS — Z3009 Encounter for other general counseling and advice on contraception: Secondary | ICD-10-CM | POA: Diagnosis not present

## 2021-05-05 LAB — HEMOGLOBIN, FINGERSTICK: Hemoglobin: 13.9 g/dL (ref 11.1–15.9)

## 2021-05-05 LAB — WET PREP FOR TRICH, YEAST, CLUE
Trichomonas Exam: NEGATIVE
Yeast Exam: NEGATIVE

## 2021-05-05 LAB — PREGNANCY, URINE: Preg Test, Ur: NEGATIVE

## 2021-05-05 MED ORDER — NORETHINDRONE 0.35 MG PO TABS: 1.0000 | ORAL_TABLET | Freq: Every day | ORAL | 13 refills | Status: DC

## 2021-05-05 MED ORDER — METRONIDAZOLE 500 MG PO TABS: 500.0000 mg | ORAL_TABLET | Freq: Two times a day (BID) | ORAL | 0 refills | Status: AC

## 2021-05-05 NOTE — Progress Notes (Signed)
[facility] [address] Main Number: [phone]    Family Planning Visit- Initial Visit  Subjective:  Heather [name] is a 25 y.o. SBF G4P2012 (2, 5 mo old) exsmoker  being seen today for an initial annual visit and to discuss contraceptive options.  The patient is currently using None for pregnancy prevention. Patient reports she does not want a pregnancy in the next year.  Patient has the following medical conditions has Family history of breast cancer in female; Acne; Seizure (HCC) since age 68; last seizure 07/2020; self d/c'd Tegretal 2018; Gestational hypertension 04/01/19; Herpes 05/24/16; Eczema; Supervision of other normal pregnancy, antepartum; Marijuana user; Obesity BMI=30.0; and Migraines dx'd 2016 on their problem list.  Chief Complaint  Patient presents with   Annual Exam    PE and [facility]    Patient reports here for physical and birth control. LMP 04/20/21. Last sex 04/13/21 without condom; with current partner x 2.5 years; 1 partner in last 3 mo. Wants ocp's. Last MJ 07/06/20. Last pap 07/06/20 neg. Last ETOH 05/02/21 (3 mixed drinks) qo weekend. Last cig 2021. Not breastfeeding. Working 43 hrs/wk and living with her kids. Seizures since age 42 with last seizure 07/2020. Was on Tegretol but self d/c'd 2018.  Dx'd with migraines 2016 with numb left hand, blurry vision, -audio, +N&V 2x/wk bilateral temples.  C/o SOB and chest pain x 5 mo sharp and constant above right breast.  Patient denies vaping, cigars  Body mass index is 30.02 kg/m. - Patient is eligible for diabetes screening based on BMI and age >99?  not applicable HA1C ordered? no  Patient reports 2  partner/s in last year. Desires STI screening?  Yes  Has patient been screened once for HCV in the past?  No  No results found for: HCVAB  Does the patient have current drug use (including MJ), have a partner with drug use, and/or has been incarcerated since last  result? No  If yes-- Screen for HCV through [facility]   Does the patient meet criteria for HBV testing? No  Criteria:  -Household, sexual or needle sharing contact with HBV -History of drug use -HIV positive -Those with known Hep C   Health Maintenance Due  Topic Date Due   COVID-19 Vaccine (1) Never done   Pneumococcal Vaccine 46-75 Years old (1 - PCV) Never done   Hepatitis C Screening  Never done    Review of Systems  Respiratory:  Positive for shortness of breath (sharp constant pain above right breast with SOB x 37mo--referred to primary care MD).   Neurological:  Positive for seizures (since age 79; self d/c'd Tegretol 2018; last seizure 07/2020) and headaches (2x/wk always bilateral temples relieved with tylenol; +N&V, -audio, +blurry vision,+neuro numb left hand).   The following portions of the patient's history were reviewed and updated as appropriate: allergies, current medications, past family history, past medical history, past social history, past surgical history and problem list. Problem list updated.   See flowsheet for other program required questions.  Objective:   Vitals:   05/05/21 1453  BP: 107/62  Weight: 180 lb 6.4 oz (81.8 kg)  Height: 5\' 5"  (1.651 m)    Physical Exam Constitutional:      Appearance: Normal appearance. She is obese.  HENT:     Head: Normocephalic and atraumatic.     Mouth/Throat:     Mouth: Mucous membranes are moist.     Comments: Last dental exam  2 years ago; urged exam asap Eyes:     Conjunctiva/sclera: Conjunctivae normal.  Neck:     Thyroid: No thyroid mass, thyromegaly or thyroid tenderness.  Cardiovascular:     Rate and Rhythm: Normal rate and regular rhythm.  Pulmonary:     Effort: Pulmonary effort is normal.     Breath sounds: Normal breath sounds.  Chest:  Breasts:    Right: Normal.     Left: Normal.  Abdominal:     Palpations: Abdomen is soft.     Comments: Soft without masses or tenderness, fair tone   Genitourinary:    General: Normal vulva.     Exam position: Lithotomy position.     Vagina: Vaginal discharge (white creamy leukorrhea with malodor; ph>4.5) present.     Cervix: Normal.     Uterus: Normal.      Adnexa: Right adnexa normal and left adnexa normal.     Rectum: Normal.  Musculoskeletal:        General: Normal range of motion.     Cervical back: Normal range of motion and neck supple.  Skin:    General: Skin is warm and dry.  Neurological:     Mental Status: She is alert.  Psychiatric:        Mood and Affect: Mood normal.      Assessment and Plan:  Heather [name] is a 25 y.o. female presenting to the [facility] Department for an initial annual wellness/contraceptive visit  Contraception counseling: Reviewed all forms of birth control options in the tiered based approach. available including abstinence; over the counter/barrier methods; hormonal contraceptive medication including pill, patch, ring, injection,contraceptive implant, ECP; hormonal and nonhormonal IUDs; permanent sterilization options including vasectomy and the various tubal sterilization modalities. Risks, benefits, and typical effectiveness rates were reviewed.  Questions were answered.  Written information was also given to the patient to review.  Patient desires ocp's, this was prescribed for patient. She will follow up in  prn for surveillance.  She was told to call with any further questions, or with any concerns about this method of contraception.  Emphasized use of condoms 100% of the time for STI prevention.  Patient was not offered ECP. ECP was not accepted by the patient. ECP counseling was not given - see RN documentation  1. Obesity, unspecified classification, unspecified obesity type, unspecified whether serious comorbidity present   2. Family planning Please give dental list to pt Treat wet mount per standing orders Immunization nurse consult Referred to primary care MD  for seizure d/o with no meds, migraines, SOB/chest pain x 5 mo - Pregnancy, urine - Hemoglobin, venipuncture - WET PREP FOR TRICH, YEAST, CLUE - Chlamydia/Gonorrhea [place] [facility] - HIV [place] [facility] - Syphilis Serology,  Lab  3. Encounter for initial prescription of contraceptive pills If PT neg will e-rx [place]ronor Please counsel on need for abstinance/back up condoms next 7 days  4. Migraine without status migrainosus, not intractable, unspecified migraine type     No follow-ups on file.  No future appointments.  Alberteen Spindle, CNM

## 2021-05-05 NOTE — Progress Notes (Signed)
Pt here for PE and BC.  Wet mount results reviewed and medication dispensed per SO.  Pt informed of negative PT results.  Consent signed for Oral Contraception and Rx sent to her Pharmacy.  Pt informed to avoid alcohol for 10 days and avoid sex for 7 days. Pt declined condoms. Berdie Ogren, RN

## 2021-05-07 LAB — HM HIV SCREENING LAB: HM HIV Screening: NEGATIVE

## 2021-07-11 HISTORY — PX: APPENDECTOMY: SHX54

## 2021-10-14 ENCOUNTER — Ambulatory Visit (LOCAL_COMMUNITY_HEALTH_CENTER): Payer: Medicaid Other | Admitting: Nurse Practitioner

## 2021-10-14 ENCOUNTER — Encounter: Payer: Self-pay | Admitting: Nurse Practitioner

## 2021-10-14 ENCOUNTER — Other Ambulatory Visit: Payer: Self-pay

## 2021-10-14 VITALS — BP 133/82 | Ht 66.25 in | Wt 149.8 lb

## 2021-10-14 DIAGNOSIS — Z3009 Encounter for other general counseling and advice on contraception: Secondary | ICD-10-CM

## 2021-10-14 DIAGNOSIS — Z3042 Encounter for surveillance of injectable contraceptive: Secondary | ICD-10-CM

## 2021-10-14 DIAGNOSIS — Z30013 Encounter for initial prescription of injectable contraceptive: Secondary | ICD-10-CM | POA: Diagnosis not present

## 2021-10-14 LAB — WET PREP FOR TRICH, YEAST, CLUE
Trichomonas Exam: NEGATIVE
Yeast Exam: NEGATIVE

## 2021-10-14 MED ORDER — MEDROXYPROGESTERONE ACETATE 150 MG/ML IM SUSP
150.0000 mg | INTRAMUSCULAR | Status: DC
  Administered 2021-10-14: 150 mg via INTRAMUSCULAR

## 2021-10-14 NOTE — Progress Notes (Signed)
[facility] problem [facility]  [facility]  Subjective:  Heather [name] is a 25 y.o. being seen today for STD screening and to take start Depo.  Patient states she was seen in June where she was given a prescription for birth control pills.  Patient states she has never picked up the prescription.  Is currently not on any birth control and desires to start today.  Last sex was 09/14/2021.  Last LMP 09/28/21.    Chief Complaint  Patient presents with   Contraception   SEXUALLY TRANSMITTED DISEASE    screening    HPI   Does the patient have a current or past history of drug use? Yes, patient declined testing today.    Health Maintenance Due  Topic Date Due   COVID-19 Vaccine (1) Never done   Hepatitis C Screening  Never done   INFLUENZA VACCINE  Never done    Review of Systems  Constitutional:  Negative for chills, diaphoresis, fever, malaise/fatigue and weight loss.  HENT:  Negative for hearing loss and sore throat.   Eyes:  Negative for blurred vision and double vision.  Respiratory:  Negative for shortness of breath.   Cardiovascular:  Negative for chest pain and leg swelling.  Gastrointestinal:  Negative for abdominal pain, constipation, diarrhea, nausea and vomiting.  Genitourinary:  Negative for dysuria, frequency and urgency.  Musculoskeletal:  Negative for joint pain and myalgias.  Skin:  Negative for itching and rash.  Neurological:  Negative for dizziness and headaches.  Endo/Heme/Allergies:  Does not bruise/bleed easily.  Psychiatric/Behavioral:  Negative for depression, substance abuse and suicidal ideas. The patient is not nervous/anxious.    The following portions of the patient's history were reviewed and updated as appropriate: allergies, current medications, past family history, past medical history, past social history, past surgical history and problem list. Problem list updated.   See flowsheet for other program  required questions.  Objective:   Vitals:   10/14/21 1433  BP: 133/82  Weight: 149 lb 12.8 oz (67.9 kg)  Height: 5' 6.25" (1.683 m)    Physical Exam Vitals and nursing note reviewed.  Constitutional:      Appearance: Normal appearance.  HENT:     Head: Normocephalic and atraumatic.     Mouth/Throat:     Mouth: Mucous membranes are moist.     Pharynx: No oropharyngeal exudate or posterior oropharyngeal erythema.     Comments: No visible dental caries.  Eyes:     General: No scleral icterus. Pulmonary:     Effort: Pulmonary effort is normal.  Abdominal:     General: Abdomen is flat.     Palpations: Abdomen is soft.  Genitourinary:    General: Normal vulva.     Rectum: Normal.     Comments: External genitalia/pubic area without nits, lice, edema, erythema, lesions and inguinal adenopathy. Vagina with normal mucosa and discharge. Cervix without visible lesions. Uterus firm, mobile, nt, no masses, no CMT, no adnexal tenderness or fullness. pH 4.5 Musculoskeletal:        General: Normal range of motion.     Cervical back: Normal range of motion.  Skin:    General: Skin is warm and dry.  Neurological:     General: No focal deficit present.     Mental Status: She is alert and oriented to person, place, and time.  Psychiatric:        Behavior: Behavior is cooperative.      Assessment and Plan:  Heather [name] is a 25 y.o. female presenting to the [facility] Department for a Women's Health problem visit  1. Family planning -25 year old female seen today for STD screening and to start Depo.   -Patient accepted all screenings including oral, vaginal CT/GC and bloodwork for HIV/RPR.  Patient meets criteria for HepB screening? Yes. Ordered? Yes, patient declined  Patient meets criteria for HepC screening? Yes. Ordered? Yes, patient declined.   Treat wet prep per standing order  Discussed time line for State Lab results and that patient will be called  with positive results and encouraged patient to call if she had not heard in 2 weeks.  Counseled to return or seek care for continued or worsening symptoms Recommended condom use with all sex  Patient is currently using Hormonal Contraception: Injection, Rings and Patches to prevent pregnancy.   - WET PREP FOR TRICH, YEAST, CLUE - Gonococcus culture - Chlamydia/Gonorrhea [place] [facility] - HIV [place] [facility] - Syphilis Serology,  Lab   2. Surveillance for Depo-Provera contraception - May have depo today.  Depo 150 mg IM q11-13 wks x 1 year.  - medroxyPROGESTERone (DEPO-PROVERA) injection 150 mg       Return in 11 weeks (on 12/30/2021) for Depo, Routine DMPA injection.   Glenna Fellows, FNP

## 2021-10-14 NOTE — Progress Notes (Signed)
Pt here to discuss BC options and a STD check.  Wet mount results reviewed, no treatment required.  Depo 150 mg given IM in LUOQ without any complications. Pt given reminder card to return in 11-13 weeks for next Depo.  Berdie Ogren, RN

## 2021-10-18 LAB — GONOCOCCUS CULTURE

## 2022-01-11 ENCOUNTER — Ambulatory Visit: Payer: Medicaid Other

## 2022-01-16 IMAGING — US US OB LIMITED
1 series · 15 of 27 positions shown · non-contrast
Comparison: none

CLINICAL DATA: 23-year-old pregnant female with pelvic pain.

EXAM:
LIMITED OBSTETRIC ULTRASOUND

[Series 1: us ob limited · 15 of 27 slices shown]
[im 1/27]
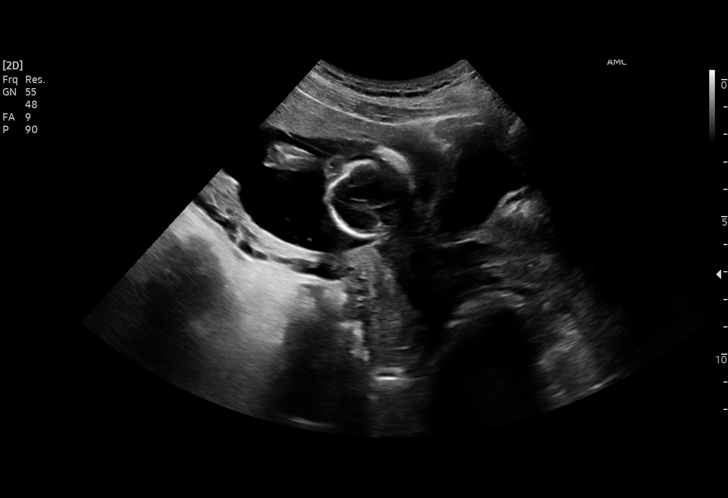
[im 3/27]
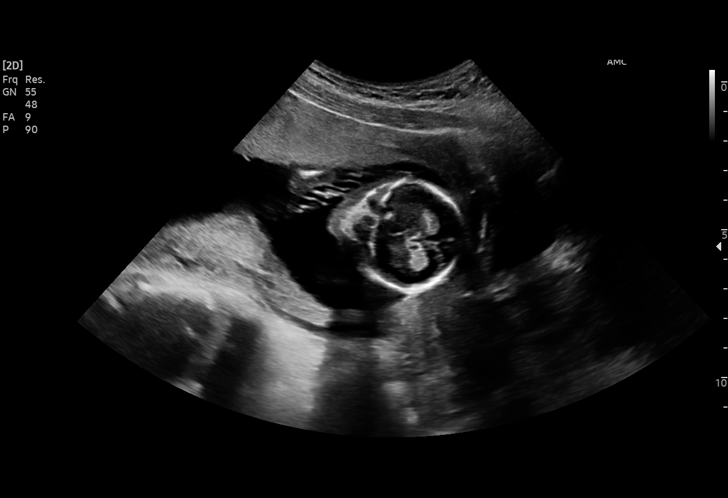
[im 5/27]
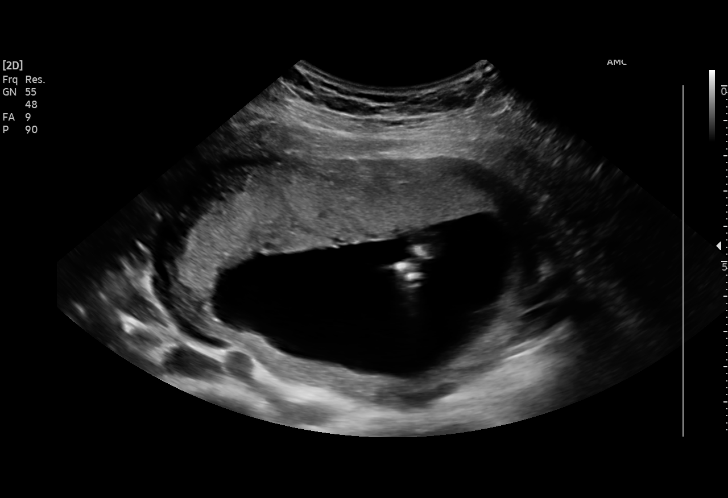
[im 7/27]
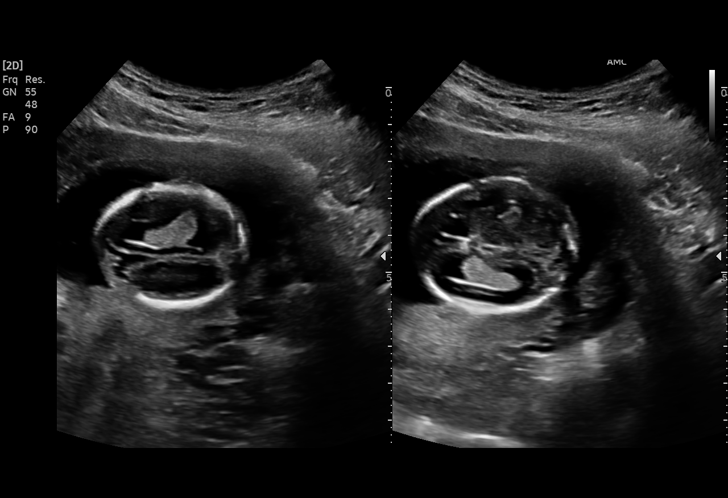
[im 9/27]
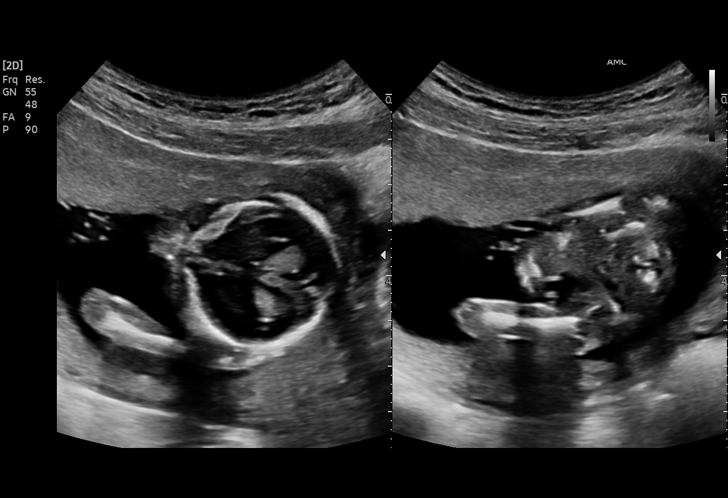
[im 10/27]
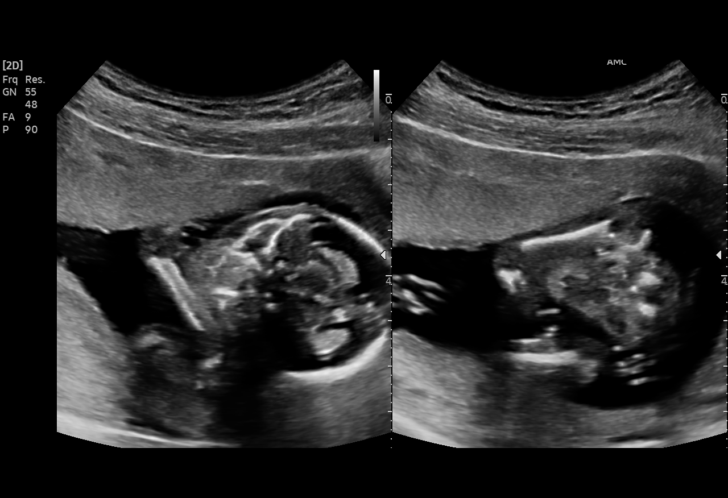
[im 12/27]
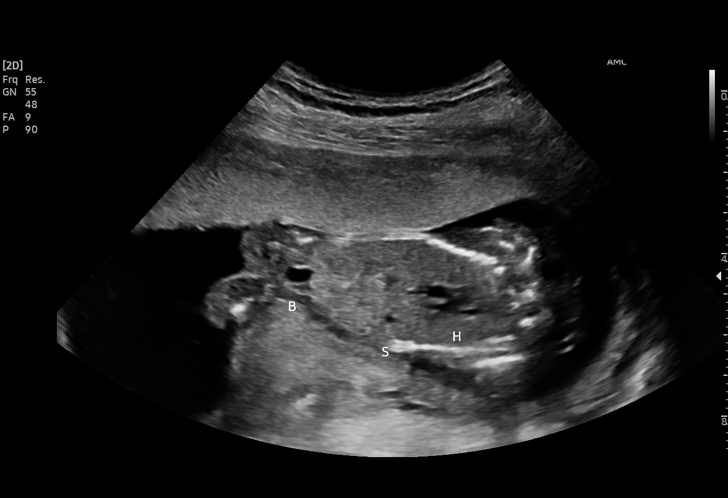
[im 14/27]
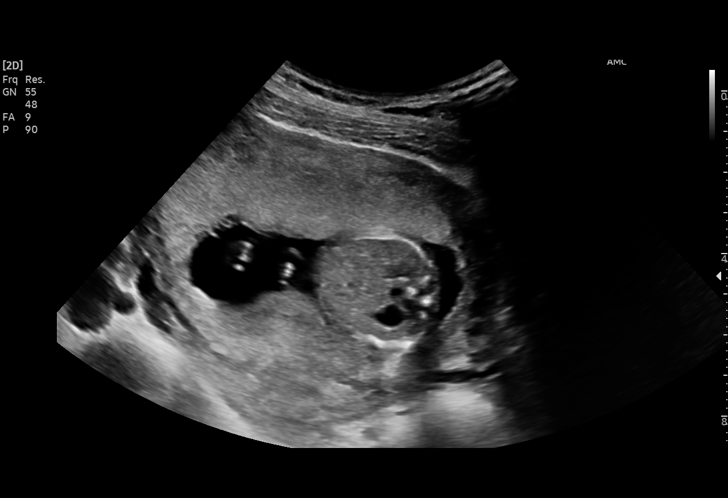
[im 16/27]
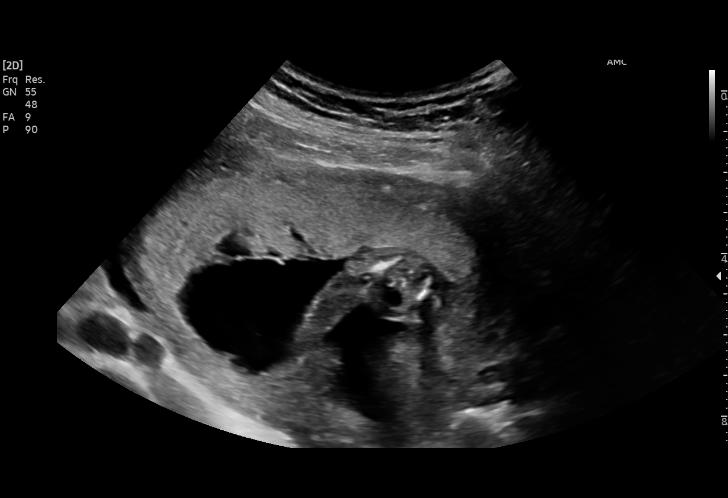
[im 18/27]
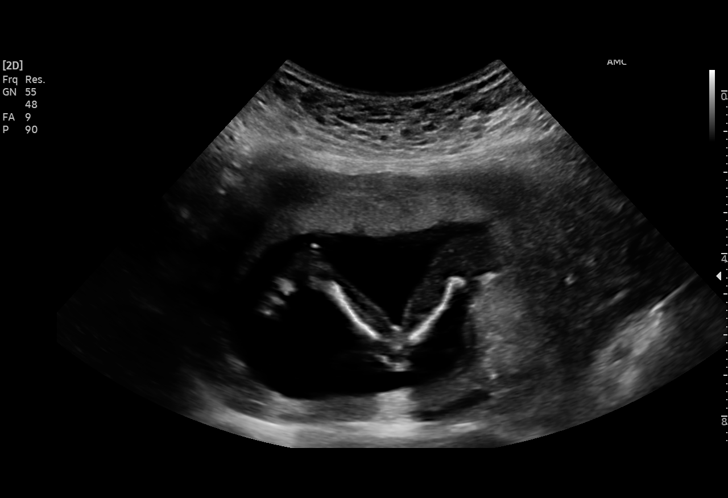
[im 19/27]
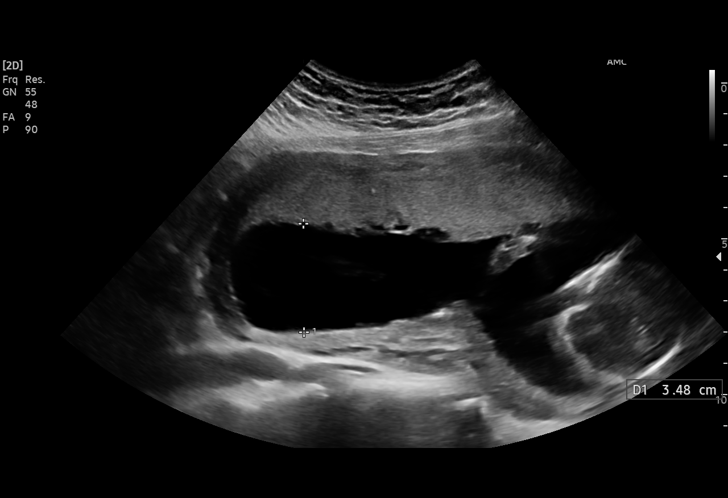
[im 21/27]
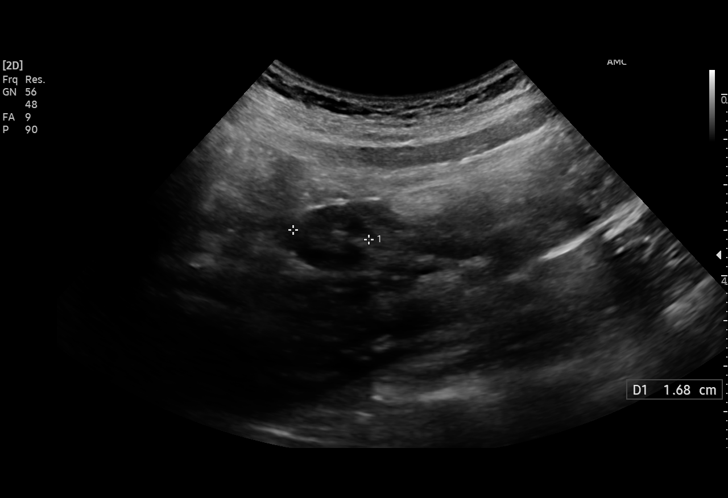
[im 23/27]
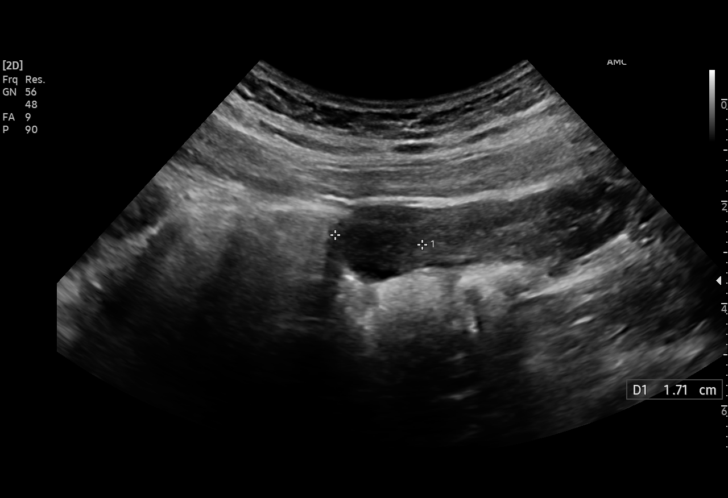
[im 25/27]
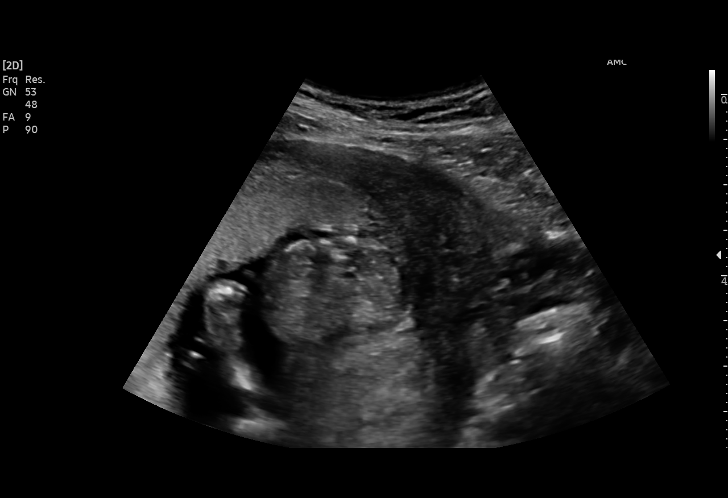
[im 27/27]
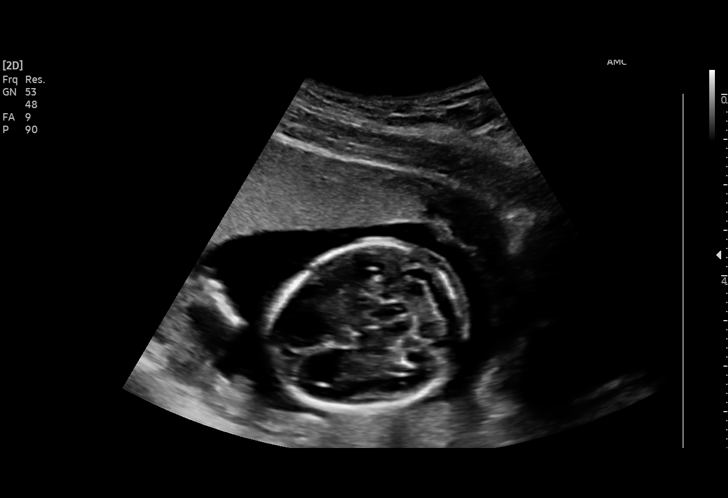

[15 of 27 positions shown; findings below may reference images not displayed]

FINDINGS: Number of Fetuses: 1

Heart Rate:  145 bpm

Movement: Detected

Presentation: Cephalic

Placental Location: Anterior

Previa: No

Amniotic Fluid (Subjective):  Within normal limits.

BPD: 3.5 cm 16 w  6 d

MATERNAL FINDINGS:

Cervix:  Appears closed.  The cervical length is 3.2 cm

Uterus/Adnexae: No abnormality visualized.
IMPRESSION: Single live intrauterine pregnancy.  No acute abnormality.

This exam is performed on an emergent basis and does not
comprehensively evaluate fetal size, dating, or anatomy; follow-up
complete OB US should be considered if further fetal assessment is
warranted.

## 2022-01-25 ENCOUNTER — Other Ambulatory Visit: Payer: Self-pay

## 2022-01-25 ENCOUNTER — Ambulatory Visit: Payer: Medicaid Other | Admitting: Advanced Practice Midwife

## 2022-01-25 ENCOUNTER — Encounter: Payer: Self-pay | Admitting: Advanced Practice Midwife

## 2022-01-25 DIAGNOSIS — B9689 Other specified bacterial agents as the cause of diseases classified elsewhere: Secondary | ICD-10-CM

## 2022-01-25 DIAGNOSIS — F17200 Nicotine dependence, unspecified, uncomplicated: Secondary | ICD-10-CM | POA: Insufficient documentation

## 2022-01-25 DIAGNOSIS — Z113 Encounter for screening for infections with a predominantly sexual mode of transmission: Secondary | ICD-10-CM

## 2022-01-25 LAB — WET PREP FOR TRICH, YEAST, CLUE
Trichomonas Exam: NEGATIVE
Yeast Exam: NEGATIVE

## 2022-01-25 MED ORDER — METRONIDAZOLE 500 MG PO TABS: 500.0000 mg | ORAL_TABLET | Freq: Two times a day (BID) | ORAL | 0 refills | Status: AC

## 2022-01-25 NOTE — Progress Notes (Signed)
Patient seen for STD testing. Wet prep reviewed. Medication dispensed. Providers orders completed. ?

## 2022-01-25 NOTE — Progress Notes (Signed)
[facility] Department ? ?STI clinic/screening visit ?[place] [address] ?[id number] ? ?Subjective:  ?Heather [name] is a 26 y.o. SBF smoker G3P2  female being seen today for an STI screening visit. The patient reports they do not have symptoms.  Patient reports that they do not desire a pregnancy in the next year.   They reported they are not interested in discussing contraception today.   ? ?No LMP recorded. ? ? ?Patient has the following medical conditions:   ?Patient Active Problem List  ? Diagnosis Date Noted  ? Smoker 01/25/2022  ? Obesity BMI=30.0 05/05/2021  ? Migraines dx'd 2016 05/05/2021  ? Marijuana user 07/15/2020  ? Gestational hypertension 04/01/19 04/01/2019  ? Seizure [facility]) since age 71; last seizure 07/2020; self d/c'd Tegretal 2018 02/28/2018  ? Herpes 05/24/16 05/24/2016  ? Acne 12/06/2015  ? Family history of breast cancer in female 11/30/2015  ? Eczema 01/14/2014  ? ? ?Chief Complaint  ?Patient presents with  ? SEXUALLY TRANSMITTED DISEASE  ?  Screening  ? ? ?HPI ? ?Patient reports asymptomatic. LMP 10/2021. Last sex 01/18/22 without condom; with current partner x 2 years; 1 sex partner in last 3 mo. Last MJ yesterday. Last ETOH 01/22/22 (2 bottles Petrone) qo weekend.  ? ?Last HIV test per patient/review of record was 10/14/21 ?Patient reports last pap was 06/26/20 neg  ? ?Screening for MPX risk: ?Does the patient have an unexplained rash? No ?Is the patient MSM? No ?Does the patient endorse multiple sex partners or anonymous sex partners? No ?Did the patient have close or sexual contact with a person diagnosed with MPX? No ?Has the patient traveled outside the [place] where MPX is endemic? No ?Is there a high clinical suspicion for MPX-- evidenced by one of the following No ? -Unlikely to be chickenpox ? -Lymphadenopathy ? -Rash that present in same phase of evolution on any given body part ?See flowsheet for further details and programmatic requirements.   ? ? ?The following portions of the patient's history were reviewed and updated as appropriate: allergies, current medications, past medical history, past social history, past surgical history and problem list. ? ?Objective:  ?There were no vitals filed for this visit. ? ?Physical Exam ?Vitals and nursing note reviewed.  ?Constitutional:   ?   Appearance: Normal appearance. She is normal weight.  ?HENT:  ?   Head: Normocephalic and atraumatic.  ?   Mouth/Throat:  ?   Mouth: Mucous membranes are moist.  ?   Pharynx: Oropharynx is clear. No oropharyngeal exudate or posterior oropharyngeal erythema.  ?Eyes:  ?   Conjunctiva/sclera: Conjunctivae normal.  ?Pulmonary:  ?   Effort: Pulmonary effort is normal.  ?Abdominal:  ?   Palpations: Abdomen is soft. There is no mass.  ?   Tenderness: There is no abdominal tenderness. There is no rebound.  ?   Comments: Soft without masses or tenderness, fair tone  ?Genitourinary: ?   General: Normal vulva.  ?   Exam position: Lithotomy position.  ?   Pubic Area: No rash or pubic lice.   ?   Labia:     ?   Right: No rash or lesion.     ?   Left: No rash or lesion.   ?   Vagina: Vaginal discharge (no leukorrhea, old tampon in place that pt states might be 1 wk old) present. No erythema, bleeding or lesions.  ?   Cervix: Normal.  ?   Uterus:  Normal.   ?   Adnexa: Right adnexa normal and left adnexa normal.  ?   Rectum: Normal.  ?   Comments: Old tampon found in posterior aspect of vagina and removed ?Lymphadenopathy:  ?   Head:  ?   Right side of head: No preauricular or posterior auricular adenopathy.  ?   Left side of head: No preauricular or posterior auricular adenopathy.  ?   Cervical: No cervical adenopathy.  ?   Right cervical: No superficial, deep or posterior cervical adenopathy. ?   Left cervical: No superficial, deep or posterior cervical adenopathy.  ?   Upper Body:  ?   Right upper body: No supraclavicular or axillary adenopathy.  ?   Left upper body: No supraclavicular or  axillary adenopathy.  ?   Lower Body: No right inguinal adenopathy. No left inguinal adenopathy.  ?Skin: ?   General: Skin is warm and dry.  ?   Findings: No rash.  ?Neurological:  ?   Mental Status: She is alert and oriented to person, place, and time.  ? ? ? ?Assessment and Plan:  ?Heather [name] is a 26 y.o. female presenting to the [facility] Department for STI screening ? ?1. Screening examination for venereal disease ?Treat wet mount per standing orders ?Immunization nurse consult ? ?- WET PREP FOR TRICH, YEAST, CLUE ?- Chlamydia/Gonorrhea [facility] ? ?2. Smoker ?2-4 cpd ?Please give 1800# to pt ? ? ? ? ?Return if symptoms worsen or fail to improve. ? ?No future appointments. ? ?Herbie Saxon, CNM ?

## 2022-01-30 LAB — GONOCOCCUS CULTURE

## 2022-03-22 ENCOUNTER — Ambulatory Visit (LOCAL_COMMUNITY_HEALTH_CENTER): Payer: Medicaid Other | Admitting: Family Medicine

## 2022-03-22 VITALS — BP 126/78 | Ht 66.25 in | Wt 153.0 lb

## 2022-03-22 DIAGNOSIS — Z3200 Encounter for pregnancy test, result unknown: Secondary | ICD-10-CM

## 2022-03-22 LAB — PREGNANCY, URINE: Preg Test, Ur: NEGATIVE

## 2022-03-22 NOTE — Progress Notes (Signed)
Pt here for STD check and birth control.  Pt appears to be upset, with tears running down her cheek and texting excessively.   Pt is requesting Nexplanon, however, when I was counseling pt on Nexplanon, she was concerned when I explained that she could have irregular bleeding for 3-6 months. She explained that she didn't continue with the Depo due to bleeding.  Pt asked if it was too late to go back on Depo and I informed her that it was not.  I explained that I would have the Provider come in and discuss birth control options due to her concerns and questions.  Provider went to see the patient apparently left without being seen.  Windle Guard, RN

## 2022-04-05 ENCOUNTER — Ambulatory Visit: Payer: Medicaid Other

## 2022-08-01 ENCOUNTER — Encounter: Payer: Medicaid Other | Admitting: Nurse Practitioner

## 2022-08-01 ENCOUNTER — Ambulatory Visit (LOCAL_COMMUNITY_HEALTH_CENTER): Payer: Medicaid Other | Admitting: Nurse Practitioner

## 2022-08-01 ENCOUNTER — Ambulatory Visit: Payer: Medicaid Other

## 2022-08-01 ENCOUNTER — Encounter: Payer: Self-pay | Admitting: Nurse Practitioner

## 2022-08-01 VITALS — BP 106/67 | HR 77 | Ht 66.0 in | Wt 146.0 lb

## 2022-08-01 DIAGNOSIS — Z309 Encounter for contraceptive management, unspecified: Secondary | ICD-10-CM

## 2022-08-01 DIAGNOSIS — Z3202 Encounter for pregnancy test, result negative: Secondary | ICD-10-CM | POA: Diagnosis not present

## 2022-08-01 DIAGNOSIS — Z3042 Encounter for surveillance of injectable contraceptive: Secondary | ICD-10-CM

## 2022-08-01 DIAGNOSIS — Z3009 Encounter for other general counseling and advice on contraception: Secondary | ICD-10-CM

## 2022-08-01 DIAGNOSIS — Z113 Encounter for screening for infections with a predominantly sexual mode of transmission: Secondary | ICD-10-CM

## 2022-08-01 LAB — HEPATITIS B SURFACE ANTIGEN: Hepatitis B Surface Ag: NONREACTIVE

## 2022-08-01 LAB — HM HIV SCREENING LAB: HM HIV Screening: NEGATIVE

## 2022-08-01 LAB — HM HEPATITIS C SCREENING LAB: HM Hepatitis Screen: NEGATIVE

## 2022-08-01 MED ORDER — MEDROXYPROGESTERONE ACETATE 150 MG/ML IM SUSP
150.0000 mg | INTRAMUSCULAR | Status: AC
  Administered 2022-08-01 – 2023-01-17 (×3): 150 mg via INTRAMUSCULAR

## 2022-08-01 NOTE — Progress Notes (Unsigned)
Pt initial visit for STD only, but pt requested to discuss and start a new BC. Visit type switched to Family Planning Acute per provider. Pt seen by NP White. RN provided family planning packet and reviewed the contents. Initial lab results reviewed with pt. Provided BC counseling. Depo provided. Pt tolerated well.

## 2022-08-01 NOTE — Progress Notes (Signed)
[facility]  [facility]  Subjective:  Heather [name] is a 26 y.o. being seen today for an STD screening and Depo.    Chief Complaint  Patient presents with   Contraception   Exposure to STD    Exposure to STD  The patient's pertinent negatives include no dysuria. Pertinent negatives include no abdominal pain, fever, sore throat or urinary frequency.     Does the patient have a current or past history of drug use? Yes   No components found for: "HCV"]   Health Maintenance Due  Topic Date Due   COVID-19 Vaccine (1) Never done   Hepatitis C Screening  Never done   CHLAMYDIA SCREENING  11/09/2021   INFLUENZA VACCINE  Never done    Review of Systems  Constitutional:  Negative for chills, fever, malaise/fatigue and weight loss.  HENT:  Negative for congestion, hearing loss and sore throat.   Eyes:  Negative for blurred vision, double vision and photophobia.  Respiratory:  Negative for shortness of breath.   Cardiovascular:  Negative for chest pain.  Gastrointestinal:  Negative for abdominal pain, blood in stool, constipation, diarrhea, heartburn, nausea and vomiting.  Genitourinary:  Negative for dysuria and frequency.  Musculoskeletal:  Negative for back pain, joint pain and neck pain.  Skin:  Negative for itching and rash.  Neurological:  Negative for dizziness, weakness and headaches.  Endo/Heme/Allergies:  Does not bruise/bleed easily.  Psychiatric/Behavioral:  Negative for depression, substance abuse and suicidal ideas.     The following portions of the patient's history were reviewed and updated as appropriate: allergies, current medications, past family history, past medical history, past social history, past surgical history and problem list. Problem list updated.   See flowsheet for other program required questions.  Objective:   Vitals:   08/01/22 1644  BP: 106/67  Pulse: 77  Weight: 146 lb (66.2 kg)   Height: 5\' 6"  (1.676 m)    Physical Exam Constitutional:      Appearance: Normal appearance.  HENT:     Head: Normocephalic. No abrasion, masses or laceration. Hair is normal.     Jaw: No tenderness or swelling.     Right Ear: External ear normal.     Left Ear: External ear normal.     Nose: Nose normal.     Mouth/Throat:     Lips: Pink. No lesions.     Mouth: Mucous membranes are moist. No lacerations or oral lesions.     Dentition: No dental caries.     Tongue: No lesions.     Palate: No mass and lesions.     Pharynx: No pharyngeal swelling, oropharyngeal exudate, posterior oropharyngeal erythema or uvula swelling.     Tonsils: No tonsillar exudate or tonsillar abscesses.     Comments: No visible signs of dental caries  Neck:     Thyroid: No thyroid mass, thyromegaly or thyroid tenderness.  Cardiovascular:     Rate and Rhythm: Normal rate and regular rhythm.  Pulmonary:     Effort: Pulmonary effort is normal.     Breath sounds: Normal breath sounds.  Abdominal:     General: Abdomen is flat. Bowel sounds are normal.     Palpations: Abdomen is soft.     Tenderness: There is no abdominal tenderness. There is no rebound.  Genitourinary:    Pubic Area: No rash or pubic lice.      Labia:        Right: No  rash, tenderness or lesion.        Left: No rash, tenderness or lesion.      Vagina: Normal. No vaginal discharge, erythema, tenderness or lesions.     Cervix: No cervical motion tenderness, discharge, lesion or erythema.     Uterus: Normal.      Adnexa:        Right: No tenderness.         Left: No tenderness.       Rectum: Normal.     Comments: Amount Discharge: small  Odor: No pH: less than 4.5 Adheres to vaginal wall: No Color: color of discharge matches the  swab Musculoskeletal:     Cervical back: Full passive range of motion without pain and normal range of motion.  Lymphadenopathy:     Cervical: No cervical adenopathy.     Right cervical: No  superficial, deep or posterior cervical adenopathy.    Left cervical: No superficial, deep or posterior cervical adenopathy.     Upper Body:     Right upper body: No epitrochlear adenopathy.     Left upper body: No epitrochlear adenopathy.     Lower Body: No right inguinal adenopathy. No left inguinal adenopathy.  Skin:    General: Skin is warm and dry.     Findings: No erythema, laceration, lesion or rash.  Neurological:     Mental Status: She is alert and oriented to person, place, and time.  Psychiatric:        Attention and Perception: Attention normal.        Mood and Affect: Mood normal.        Speech: Speech normal.        Behavior: Behavior normal. Behavior is cooperative.       Assessment and Plan:  Heather [name] is a 26 y.o. female presenting to the [facility] Department for a Women's Health problem visit  1. Family planning counseling -Patient in clinic today for an STD visit and would like to start Depo.  Patient needs a physical for continuation of birth control method.  -PT today=negative  -ROS reviewed, no signs and symptoms.  - Pregnancy, urine  2. Screening examination for venereal disease -STD screening today. -Patient accepted all screenings including oral, vaginal CT/GC, wet prep and bloodwork for HIV/RPR.  Patient meets criteria for HepB screening? Yes. Ordered? Yes Patient meets criteria for HepC screening? Yes. Ordered? Yes  Treat wet prep per standing order Discussed time line for State Lab results and that patient will be called with positive results and encouraged patient to call if she had not heard in 2 weeks.  Counseled to return or seek care for continued or worsening symptoms Recommended condom use with all sex  Patient is currently not using  contraception  to prevent pregnancy.    - HIV/HCV [facility] - Syphilis Serology, [facility] - HBV Antigen/Antibody [facility] Lab - [facility], YEAST, CLUE  3. Surveillance for Depo-Provera contraception -May have Depo 150 MG IM q11-13 weeks x 1 year.  Patient advised that she will need a physical for continuation of method.    - medroxyPROGESTERone (DEPO-PROVERA) injection 150 mg  Total time spent: 30 minutes    Return in about 11 weeks (around 10/17/2022) for Depo.    Gregary Cromer, FNP

## 2022-08-02 LAB — WET PREP FOR TRICH, YEAST, CLUE: Trichomonas Exam: NEGATIVE

## 2022-08-02 LAB — PREGNANCY, URINE: Preg Test, Ur: NEGATIVE

## 2022-08-03 NOTE — Progress Notes (Signed)
See other note. Vedanshi Massaro, FNP  

## 2022-09-16 ENCOUNTER — Emergency Department: Payer: Medicaid Other

## 2022-09-16 ENCOUNTER — Inpatient Hospital Stay
Admission: EM | Admit: 2022-09-16 | Discharge: 2022-09-19 | DRG: 100 | Disposition: A | Discharge status: Home / Self Care | Payer: Medicaid Other | Attending: Student in an Organized Health Care Education/Training Program | Admitting: Student in an Organized Health Care Education/Training Program

## 2022-09-16 ENCOUNTER — Other Ambulatory Visit: Payer: Self-pay

## 2022-09-16 DIAGNOSIS — Z820 Family history of epilepsy and other diseases of the nervous system: Secondary | ICD-10-CM

## 2022-09-16 DIAGNOSIS — T450X5A Adverse effect of antiallergic and antiemetic drugs, initial encounter: Secondary | ICD-10-CM | POA: Diagnosis present

## 2022-09-16 DIAGNOSIS — W1830XA Fall on same level, unspecified, initial encounter: Secondary | ICD-10-CM | POA: Diagnosis present

## 2022-09-16 DIAGNOSIS — S066XAA Traumatic subarachnoid hemorrhage with loss of consciousness status unknown, initial encounter: Secondary | ICD-10-CM | POA: Diagnosis present

## 2022-09-16 DIAGNOSIS — R7989 Other specified abnormal findings of blood chemistry: Secondary | ICD-10-CM

## 2022-09-16 DIAGNOSIS — Z818 Family history of other mental and behavioral disorders: Secondary | ICD-10-CM

## 2022-09-16 DIAGNOSIS — E872 Acidosis, unspecified: Secondary | ICD-10-CM | POA: Diagnosis present

## 2022-09-16 DIAGNOSIS — G40901 Epilepsy, unspecified, not intractable, with status epilepticus: Principal | ICD-10-CM | POA: Diagnosis present

## 2022-09-16 DIAGNOSIS — R0902 Hypoxemia: Secondary | ICD-10-CM | POA: Diagnosis present

## 2022-09-16 DIAGNOSIS — R001 Bradycardia, unspecified: Secondary | ICD-10-CM | POA: Diagnosis present

## 2022-09-16 DIAGNOSIS — Z91148 Patient's other noncompliance with medication regimen for other reason: Secondary | ICD-10-CM

## 2022-09-16 DIAGNOSIS — G43909 Migraine, unspecified, not intractable, without status migrainosus: Secondary | ICD-10-CM | POA: Diagnosis present

## 2022-09-16 DIAGNOSIS — E876 Hypokalemia: Secondary | ICD-10-CM | POA: Diagnosis present

## 2022-09-16 DIAGNOSIS — Z8249 Family history of ischemic heart disease and other diseases of the circulatory system: Secondary | ICD-10-CM

## 2022-09-16 DIAGNOSIS — R569 Unspecified convulsions: Secondary | ICD-10-CM

## 2022-09-16 DIAGNOSIS — F17210 Nicotine dependence, cigarettes, uncomplicated: Secondary | ICD-10-CM | POA: Diagnosis present

## 2022-09-16 DIAGNOSIS — Z803 Family history of malignant neoplasm of breast: Secondary | ICD-10-CM

## 2022-09-16 DIAGNOSIS — I609 Nontraumatic subarachnoid hemorrhage, unspecified: Secondary | ICD-10-CM

## 2022-09-16 LAB — CBC WITH DIFFERENTIAL/PLATELET
Abs Immature Granulocytes: 0.02 10*3/uL (ref 0.00–0.07)
Basophils Absolute: 0.1 10*3/uL (ref 0.0–0.1)
Basophils Relative: 1 %
Eosinophils Absolute: 0.3 10*3/uL (ref 0.0–0.5)
Eosinophils Relative: 3 %
HCT: 41.6 % (ref 36.0–46.0)
Hemoglobin: 13.5 g/dL (ref 12.0–15.0)
Immature Granulocytes: 0 %
Lymphocytes Relative: 41 %
Lymphs Abs: 3.3 10*3/uL (ref 0.7–4.0)
MCH: 29.6 pg (ref 26.0–34.0)
MCHC: 32.5 g/dL (ref 30.0–36.0)
MCV: 91.2 fL (ref 80.0–100.0)
Monocytes Absolute: 0.4 10*3/uL (ref 0.1–1.0)
Monocytes Relative: 4 %
Neutro Abs: 4.1 10*3/uL (ref 1.7–7.7)
Neutrophils Relative %: 51 %
Platelets: 230 10*3/uL (ref 150–400)
RBC: 4.56 MIL/uL (ref 3.87–5.11)
RDW: 12.7 % (ref 11.5–15.5)
WBC: 8.1 10*3/uL (ref 4.0–10.5)
nRBC: 0 % (ref 0.0–0.2)

## 2022-09-16 LAB — COMPREHENSIVE METABOLIC PANEL
ALT: 45 U/L — ABNORMAL HIGH (ref 0–44)
AST: 31 U/L (ref 15–41)
Albumin: 4.3 g/dL (ref 3.5–5.0)
Alkaline Phosphatase: 73 U/L (ref 38–126)
Anion gap: 7 (ref 5–15)
BUN: 11 mg/dL (ref 6–20)
CO2: 25 mmol/L (ref 22–32)
Calcium: 9.1 mg/dL (ref 8.9–10.3)
Chloride: 109 mmol/L (ref 98–111)
Creatinine, Ser: 0.91 mg/dL (ref 0.44–1.00)
GFR, Estimated: >60 mL/min (ref >60)
Glucose, Bld: 101 mg/dL — ABNORMAL HIGH (ref 70–99)
Potassium: 2.8 mmol/L — ABNORMAL LOW (ref 3.5–5.1)
Sodium: 141 mmol/L (ref 135–145)
Total Bilirubin: 0.7 mg/dL (ref 0.3–1.2)
Total Protein: 7.4 g/dL (ref 6.5–8.1)

## 2022-09-16 LAB — PROTIME-INR
INR: 1 (ref 0.8–1.2)
Prothrombin Time: 13 seconds (ref 11.4–15.2)

## 2022-09-16 MED ORDER — LEVETIRACETAM IN NACL 1000 MG/100ML IV SOLN
1000.0000 mg | Freq: Once | INTRAVENOUS | Status: AC
  Administered 2022-09-16: 1000 mg via INTRAVENOUS
  Filled 2022-09-16: qty 100

## 2022-09-16 MED ORDER — LORAZEPAM 2 MG/ML IJ SOLN: 4.0000 mg | Freq: Once | INTRAMUSCULAR | Status: DC | PRN

## 2022-09-16 MED ORDER — POTASSIUM CHLORIDE 10 MEQ/100ML IV SOLN
10.0000 meq | INTRAVENOUS | Status: AC
  Administered 2022-09-17 (×5): 10 meq via INTRAVENOUS
  Filled 2022-09-16 (×5): qty 100

## 2022-09-16 MED ORDER — LACTATED RINGERS IV BOLUS (SEPSIS)
1000.0000 mL | Freq: Once | INTRAVENOUS | Status: AC
  Administered 2022-09-16: 1000 mL via INTRAVENOUS

## 2022-09-16 MED ORDER — LORAZEPAM 2 MG/ML IJ SOLN
INTRAMUSCULAR | Status: AC
  Administered 2022-09-16: 4 mg via INTRAVENOUS
  Filled 2022-09-16: qty 2

## 2022-09-16 MED ORDER — LORAZEPAM 2 MG/ML IJ SOLN: 1.0000 mg | Freq: Once | INTRAMUSCULAR | Status: AC

## 2022-09-16 MED ORDER — LORAZEPAM 2 MG/ML IJ SOLN: 4.0000 mg | INTRAMUSCULAR | Status: AC

## 2022-09-16 MED ORDER — LORAZEPAM 2 MG/ML IJ SOLN
INTRAMUSCULAR | Status: AC
  Administered 2022-09-16: 2 mg via INTRAVENOUS
  Filled 2022-09-16: qty 1

## 2022-09-16 MED ORDER — LEVETIRACETAM IN NACL 1500 MG/100ML IV SOLN
1500.0000 mg | Freq: Once | INTRAVENOUS | Status: AC
  Administered 2022-09-16: 1500 mg via INTRAVENOUS
  Filled 2022-09-16: qty 100

## 2022-09-16 MED ORDER — LORAZEPAM 2 MG/ML IJ SOLN
2.0000 mg | Freq: Once | INTRAMUSCULAR | Status: DC | PRN
  Filled 2022-09-16: qty 1

## 2022-09-16 NOTE — ED Notes (Signed)
CT currently has someone on the table and will call this RN back when I can bring pt over.

## 2022-09-16 NOTE — ED Provider Notes (Incomplete)
[facility] Provider Note    Event Date/Time   First MD Initiated Contact with Patient 09/16/22 2305     (approximate)   History   Loss of Consciousness   HPI Level 5 caveat:  history/ROS limited by acute/critical illness  Heather Heather [name] is a 26 y.o. female whose mother reports that the patient has no chronic medical conditions although she had a history of febrile seizure as a child, and there is a strong family history of seizure disorder with multiple family members.  The patient presents after losing consciousness and/or having seizure like activity at home.  Her mother reports that she had a normal day today, Heather [name] from home, played with the kids, normal level of activity.  She was not reporting feeling ill or having any abnormalities.  She has not had a recent fever or any other signs of illness.  She has not started any new medications and her mother denies that the patient takes any sort of drugs or drinks alcohol (this was also confirmed by the patient prior to her becoming unresponsive with the nurse).  Her mother reports that they were all at home and that the patient's brother heard a thump in the kitchen.  They went in to find the patient flat on her back on the floor and unresponsive with her eyes open.  Her eyes but occasionally flutter.  At some point after EMS arrived she became more responsive and she was awake and alert and talking to the ED nurse upon arrival to the department.  She was reporting headache and neck pain and had been placed in a c-collar by EMS prior to arrival.  She was speaking clearly and moving all of her extremities and said that she was scared and did not want anyone to leave.  The ED nurse reports that they were talking and that the patient was essentially midsentence when her eyes deviated to the upper right and she stopped speaking.  The nurse talk to her a couple of times and asked the patient to squeeze her hand and  she could feel the hand fluttering a little bit but there was no voluntary action.  The nurse got me and I presented immediately to bedside.  I talked to the patient and got no response and saw her looking to the upper right and unresponsive, still breathing easily and comfortably.  Shortly after I arrived to the room the patient grimaced and then started having generalized tonic-clonic seizure-like activity.  I performed a brisk sternal rub and there was no response.  The patient was on the pulse oximeter and her oxygen saturations dropped quickly to the 70s as she froth at the mouth and had a generalized tonic-clonic seizure.  We were able to hook up suction and get her on the oxygen but by then the seizure had stopped and had lasted perhaps about 1 minute.  She had a couple of sonorous breaths and then returned to unresponsiveness but no longer had gaze deviation and did not seem to be having any seizure-like activity.  Her mother confirmed that this was much more elaborative presentation and then at home where she was simply not responding, more of a "absence" like she initially did in the ED.     Physical Exam   Triage Vital Signs: ED Triage Vitals  Enc Vitals Group     BP 09/16/22 2200 (!) 141/93     Pulse Rate 09/16/22 2200 96     Resp  09/16/22 2200 18     Temp 09/16/22 2200 98.1 F (36.7 C)     Temp src --      SpO2 09/16/22 2200 99 %     Weight --      Height --      Head Circumference --      Peak Flow --      Pain Score 09/16/22 2250 8     Pain Loc --      Pain Edu? --      Excl. in [place]? --     Most recent vital signs: Vitals:   09/17/22 0030 09/17/22 0100  BP: 137/89 (!) 141/95  Pulse: 97 68  Resp: (!) 21 (!) 26  Temp:    SpO2: 100% 100%     General: Initially awake and alert and answering questions, now postictal after generalized seizure activity.  C-collar is in place, applied by EMS. CV:  Good peripheral perfusion.  Borderline tachycardia.  Regular  rhythm. Resp:  Normal effort.  Lungs are clear to auscultation.  Mild tachypnea. Abd:  No distention.  No tenderness to palpation. Other:  Initially moving all 4 extremities, now postictal.  Unable to respond to neuro exam.  Pupils are slightly dilated but are equal bilaterally and are responsive.   ED Results / Procedures / Treatments   Labs (all labs ordered are listed, but only abnormal results are displayed) Labs Reviewed  COMPREHENSIVE METABOLIC PANEL - Abnormal; Notable for the following components:      Result Value   Potassium 2.8 (*)    Glucose, Bld 101 (*)    ALT 45 (*)    All other components within normal limits  URINE DRUG SCREEN, QUALITATIVE ([facility] ONLY) - Abnormal; Notable for the following components:   Cannabinoid 50 Ng, Ur [place] POSITIVE (*)    All other components within normal limits  LACTIC ACID, PLASMA - Abnormal; Notable for the following components:   Lactic Acid, Venous >9.0 (*)    All other components within normal limits  LACTIC ACID, PLASMA - Abnormal; Notable for the following components:   Lactic Acid, Venous 2.0 (*)    All other components within normal limits  URINALYSIS, COMPLETE (UACMP) WITH MICROSCOPIC - Abnormal; Notable for the following components:   Color, Urine STRAW (*)    APPearance CLEAR (*)    Protein, ur 100 (*)    All other components within normal limits  ACETAMINOPHEN LEVEL - Abnormal; Notable for the following components:   Acetaminophen (Tylenol), Serum <10 (*)    All other components within normal limits  SALICYLATE LEVEL - Abnormal; Notable for the following components:   Salicylate Lvl [id number] (*)    All other components within normal limits  CBC WITH DIFFERENTIAL/PLATELET  PROTIME-INR  ETHANOL  LIPASE, BLOOD  MAGNESIUM  HIV ANTIBODY (ROUTINE TESTING W REFLEX)  CBC  BASIC METABOLIC PANEL  MAGNESIUM  PHOSPHORUS  CBG MONITORING, ED  POC URINE PREG, ED     EKG  ED ECG REPORT I, Hinda Kehr, the attending physician,  personally viewed and interpreted this ECG.  Date: 09/16/2022 EKG Time: 22: 51 Rate: 102 Rhythm: Borderline sinus tachycardia QRS Axis: normal Intervals: normal ST/T Wave abnormalities: Non-specific ST segment / T-wave changes, but no clear evidence of acute ischemia. Narrative Interpretation: no definitive evidence of acute ischemia; does not meet STEMI criteria.   ED ECG REPORT I, Hinda Kehr, the attending physician, personally viewed and interpreted this ECG.  Date: 09/17/2022 EKG Time: 2:55 AM Rate:  61 Rhythm: normal sinus rhythm with sinus arrhythmia QRS Axis: normal Intervals: normal ST/T Wave abnormalities: Non-specific ST segment / T-wave changes, but no clear evidence of acute ischemia. Narrative Interpretation: no definitive evidence of acute ischemia; does not meet STEMI criteria.   RADIOLOGY See hospital course for details: No evidence of acute intracranial hemorrhage or mass, no cervical spine injury.    PROCEDURES:  Critical Care performed: Yes, see critical care procedure note(s)  .1-3 Lead EKG Interpretation  Performed by: Hinda Kehr, MD Authorized by: Hinda Kehr, MD     Interpretation: abnormal     ECG rate:  104   ECG rate assessment: tachycardic     Rhythm: sinus tachycardia     Ectopy: none     Conduction: normal   .Critical Care  Performed by: Hinda Kehr, MD Authorized by: Hinda Kehr, MD   Critical care provider statement:    Critical care time (minutes):  60   Critical care time was exclusive of:  Separately billable procedures and treating other patients   Critical care was necessary to treat or prevent imminent or life-threatening deterioration of the following conditions:  CNS failure or compromise   Critical care was time spent personally by me on the following activities:  Development of treatment plan with patient or surrogate, evaluation of patient's response to treatment, examination of patient, obtaining history from  patient or surrogate, ordering and performing treatments and interventions, ordering and review of laboratory studies, ordering and review of radiographic studies, pulse oximetry, re-evaluation of patient's condition and review of old [place] ED: Medications  LORazepam (ATIVAN) injection 4 mg (has no administration in time range)  LORazepam (ATIVAN) injection 2 mg (has no administration in time range)  potassium chloride 10 mEq in 100 mL IVPB ( Intravenous Rate/Dose Verify 09/17/22 0130)  docusate sodium (COLACE) capsule 100 mg (has no administration in time range)  polyethylene glycol (MIRALAX / GLYCOLAX) packet 17 g (has no administration in time range)  0.9 %  sodium chloride infusion (has no administration in time range)  levETIRAcetam (KEPPRA) IVPB 500 mg/100 mL premix (has no administration in time range)  levETIRAcetam (KEPPRA) IVPB 1500 mg/ 100 mL premix (0 mg Intravenous Stopped 09/16/22 2339)  lactated ringers bolus 1,000 mL (0 mLs Intravenous Stopped 09/17/22 0020)  LORazepam (ATIVAN) injection 1 mg (2 mg Intravenous Given 09/16/22 2318)  LORazepam (ATIVAN) injection 4 mg (4 mg Intravenous Given 09/16/22 2339)  levETIRAcetam (KEPPRA) IVPB 1000 mg/100 mL premix (0 mg Intravenous Stopped 09/17/22 0009)    Followed by  levETIRAcetam (KEPPRA) IVPB 1000 mg/100 mL premix (0 mg Intravenous Stopped 09/17/22 0009)  sodium chloride 0.9 % bolus 1,000 mL (0 mLs Intravenous Stopped 09/17/22 0058)  sodium chloride 0.9 % bolus 1,000 mL (1,000 mLs Intravenous New Bag/Given 09/17/22 0128)     IMPRESSION / MDM / [place] / ED COURSE  I reviewed the triage vital signs and the nursing notes.                              Differential diagnosis includes, but is not limited to, seizure, drug or medication side effect, acute intracranial hemorrhage, cervical spine injury, metabolic or electrolyte abnormality including hypoglycemia, infectious process such as UTI,  pneumonia, meningitis.  Patient's presentation is most consistent with acute presentation with potential threat to life or bodily function.  Patient has a strong family history of seizure-like activity but interestingly,  her mother reports that none of the family members are on antiepileptic drugs.  This would suggest possibly psychogenic or nonepileptiform seizures, but the patient definitely had a generalized tonic-clonic seizure in front of me.  Infectious process seems unlikely based on the history and the current presentation.  Intracranial bleed or other trauma from her fall are possible and I will maintain the c-collar until I can order a CT head and C-spine safely after she has received loading doses of medications to try and prevent seizure in the CT scanner.  Medications ordered: Ativan 2 mg IV, Keppra 1.5 g IV.  Also put the patient on supplementary oxygen.  Labs/studies ordered: Urine drug screen, urinalysis, point-of-care urine pregnancy test, CBG, CBC with differential, CMP, pro time-INR, lipase, ethanol level, salicylate level, acetaminophen level, magnesium level, lactic acid.  The patient is on the cardiac monitor to evaluate for evidence of arrhythmia and/or significant heart rate changes.   Clinical Course as of 09/17/22 0205  Ludwig Clarks Sep 16, 2022  2350 Patient just had another seizure.  She did the same thing, started to stare off in an "absence" presentation, and then had a generalized tonic-clonic seizure again with me at bedside.  She desatted into the 70s again and spite of having a nasal cannula in place which I increased to 6 L.  The seizure again lasted between 1 and 2 minutes.  She is once again postictal.  I ordered and she received Ativan 4 mg IV during the seizure-like activity, for a total of Ativan 6 mg since she arrived.  She has already received Keppra 1.5 g IV, and given that she now qualifies for status epilepticus, I ordered an additional 2 g IV after speaking with  Corene Cornea and the pharmacy to verify that the appropriate loading dose is 60 mg/kg.  Patient going to CT shortly with nurse and another Ativan 2 mg IV in case of additional seizure. [CF]  2353 Discussed with mother the possibility that the patient may need intubation and/or transfer for continuous EEG monitoring, though there may be other options in the ICU for seizure monitoring. [CF]  2356 CBC and CMP are essentially normal except for a decreased potassium at 2.8.  Difficult to know whether this is clinically relevant, but I ordered potassium 10 mill equivalents IV (since she cannot take oral medications at this time) x6 doses.  I also added on a magnesium level. [CF]  Sat Sep 17, 2022  0008 Lactic Acid, Venous(!!): >9.0 The patient is noted to have a lactate>4. With the current information available to me, I don't think the patient is in septic shock. The lactate>4, is related to seizure activity.  Ordering an additional 1L of IV fluid, this time NS.  [CF]  0106 I reassessed the patient.  She is somnolent after a total of Ativan 6 mg IV but is no longer having seizure-like activity.  I updated her mother about her current status. [CF]  0106 DG Chest [facility] I viewed and interpreted the patient's 1 view chest x-ray and I see no evidence of pneumonia or pulmonary edema.  I also read the radiologist's report, which confirmed no acute findings. [CF]  0106 I viewed and interpreted the patient's head CT and cervical spine CT.  I see no evidence of intracranial bleed nor any space-occupying lesions or evidence of edema.  Similarly there is no evidence of acute traumatic injury to her C-spine.  Radiology reports agree that the findings are normal.  I returned to  the room and removed her c-collar.  Even though I cannot technically clinically clear her, she had a relatively low mechanism of injury, and given the reassuring imaging, I feel that it is safer for her to remove the c-collar given the possibility  that she may vomit or otherwise aspirate given an extremely low possibility of spinal cord injury without radiographic evidence. [CF]  0108 No evidence of acute renal dysfunction.  Urinalyses are pending but the urine pregnancy test is negative.  Fluids are running.  I am going to consult Webb Silversmith in the ICU to discuss the case and whether or not this is a case appropriate for [facility] or whether it requires transfer. [CF]  0118 Consulted by phone with Webb Silversmith in the ICU.  We discussed the case and given that the patient has stopped seizing after a high loading dose of Keppra and a total of Ativan 6 mg IV, she feels comfortable admitting the patient to our ICU for additional evaluation and treatment.  We both acknowledge that the patient may need to be transferred if she starts seizing again, but she may also benefit from a fosphenytoin load.  However we both agree that we should hold off at this time to facilitate better evaluation by neurology in the morning. Heather Heather [name] will consult on the patient in the emergency department and admit her to the ICU. [CF]  0119 Updated the patient's mother.  Salicylate, acetaminophen levels are both negative.  Urinalysis unremarkable. [CF]  0122 Given the substantially elevated lactic acid, I ordered a third liter of crystalloid, this time 1 L LR. I asked her nurse to send a repeat lactic level after the third liter [CF]  0201 Urine drug screen positive for cannabinoids. Heather Heather [name] consulted on the patient in the emergency department and will admit to the ICU.  She got a slightly different story from the family then [employer], with a report of seizure-like activity at home and that the patient is supposed to be taking Tegretol. [CF]  0202 Patient remains somnolent but with no evidence of seizure-like activity. [CF]  0204 Lactic Acid, Venous(!!): 2.0 Lactic acid has come all the way down to 2 after fluid resuscitation which is encouraging and again points away from a  sepsis/infectious process. [CF]    Clinical Course User Index [CF] Loleta Rose, MD     FINAL CLINICAL IMPRESSION(S) / ED DIAGNOSES   Final diagnoses:  Status epilepticus (HCC)  Elevated lactic acid level     Rx / DC Orders   ED Discharge Orders     None        Note:  This document was prepared using Dragon voice recognition software and may include unintentional dictation errors.   Loleta Rose, MD 09/17/22 Jackey Loge    Loleta Rose, MD 09/17/22 [phone]

## 2022-09-16 NOTE — ED Notes (Signed)
ED Provider at bedside. 

## 2022-09-16 NOTE — ED Notes (Addendum)
ED Modesto Charon at bedside as DR. York Cerise wasn't available. Pt started to act the way she was prior to the seizure this RN witnessed. Pt's eyes are open and her hand is twitching and she is no longer responsive to voice or pain. MD wong sts to find him or Dr. York Cerise if pt appears to have another seizure.

## 2022-09-16 NOTE — ED Notes (Signed)
Pt had a witnessed seizure by this staff and MD.

## 2022-09-16 NOTE — ED Notes (Signed)
Patient transported to CT with RN and RN on monitor.

## 2022-09-16 NOTE — ED Notes (Signed)
ED Provider at bedside. PT had another seizure witnessed by staff and MD.

## 2022-09-17 ENCOUNTER — Inpatient Hospital Stay: Payer: Medicaid Other

## 2022-09-17 ENCOUNTER — Emergency Department: Payer: Medicaid Other

## 2022-09-17 ENCOUNTER — Other Ambulatory Visit: Payer: Medicaid Other

## 2022-09-17 DIAGNOSIS — Z803 Family history of malignant neoplasm of breast: Secondary | ICD-10-CM | POA: Diagnosis not present

## 2022-09-17 DIAGNOSIS — Z818 Family history of other mental and behavioral disorders: Secondary | ICD-10-CM | POA: Diagnosis not present

## 2022-09-17 DIAGNOSIS — Z8249 Family history of ischemic heart disease and other diseases of the circulatory system: Secondary | ICD-10-CM | POA: Diagnosis not present

## 2022-09-17 DIAGNOSIS — W1830XA Fall on same level, unspecified, initial encounter: Secondary | ICD-10-CM | POA: Diagnosis present

## 2022-09-17 DIAGNOSIS — S066XAA Traumatic subarachnoid hemorrhage with loss of consciousness status unknown, initial encounter: Secondary | ICD-10-CM | POA: Diagnosis present

## 2022-09-17 DIAGNOSIS — R7989 Other specified abnormal findings of blood chemistry: Secondary | ICD-10-CM | POA: Diagnosis not present

## 2022-09-17 DIAGNOSIS — Z91148 Patient's other noncompliance with medication regimen for other reason: Secondary | ICD-10-CM | POA: Diagnosis not present

## 2022-09-17 DIAGNOSIS — T450X5A Adverse effect of antiallergic and antiemetic drugs, initial encounter: Secondary | ICD-10-CM | POA: Diagnosis present

## 2022-09-17 DIAGNOSIS — G43909 Migraine, unspecified, not intractable, without status migrainosus: Secondary | ICD-10-CM | POA: Diagnosis present

## 2022-09-17 DIAGNOSIS — R0902 Hypoxemia: Secondary | ICD-10-CM | POA: Diagnosis present

## 2022-09-17 DIAGNOSIS — E872 Acidosis, unspecified: Secondary | ICD-10-CM | POA: Diagnosis present

## 2022-09-17 DIAGNOSIS — R55 Syncope and collapse: Secondary | ICD-10-CM | POA: Diagnosis present

## 2022-09-17 DIAGNOSIS — G40901 Epilepsy, unspecified, not intractable, with status epilepticus: Secondary | ICD-10-CM | POA: Diagnosis present

## 2022-09-17 DIAGNOSIS — Z820 Family history of epilepsy and other diseases of the nervous system: Secondary | ICD-10-CM | POA: Diagnosis not present

## 2022-09-17 DIAGNOSIS — R569 Unspecified convulsions: Secondary | ICD-10-CM | POA: Diagnosis not present

## 2022-09-17 DIAGNOSIS — F17210 Nicotine dependence, cigarettes, uncomplicated: Secondary | ICD-10-CM | POA: Diagnosis present

## 2022-09-17 DIAGNOSIS — E876 Hypokalemia: Secondary | ICD-10-CM | POA: Diagnosis present

## 2022-09-17 DIAGNOSIS — R001 Bradycardia, unspecified: Secondary | ICD-10-CM | POA: Diagnosis present

## 2022-09-17 DIAGNOSIS — I609 Nontraumatic subarachnoid hemorrhage, unspecified: Secondary | ICD-10-CM | POA: Diagnosis not present

## 2022-09-17 DIAGNOSIS — G40909 Epilepsy, unspecified, not intractable, without status epilepticus: Secondary | ICD-10-CM | POA: Diagnosis not present

## 2022-09-17 LAB — CBC
HCT: 40.7 % (ref 36.0–46.0)
Hemoglobin: 13.1 g/dL (ref 12.0–15.0)
MCH: 29.5 pg (ref 26.0–34.0)
MCHC: 32.2 g/dL (ref 30.0–36.0)
MCV: 91.7 fL (ref 80.0–100.0)
Platelets: 192 10*3/uL (ref 150–400)
RBC: 4.44 MIL/uL (ref 3.87–5.11)
RDW: 12.7 % (ref 11.5–15.5)
WBC: 12.8 10*3/uL — ABNORMAL HIGH (ref 4.0–10.5)
nRBC: 0 % (ref 0.0–0.2)

## 2022-09-17 LAB — BASIC METABOLIC PANEL
Anion gap: 3 — ABNORMAL LOW (ref 5–15)
BUN: 8 mg/dL (ref 6–20)
CO2: 23 mmol/L (ref 22–32)
Calcium: 8.3 mg/dL — ABNORMAL LOW (ref 8.9–10.3)
Chloride: 114 mmol/L — ABNORMAL HIGH (ref 98–111)
Creatinine, Ser: 0.75 mg/dL (ref 0.44–1.00)
GFR, Estimated: >60 mL/min (ref >60)
Glucose, Bld: 118 mg/dL — ABNORMAL HIGH (ref 70–99)
Potassium: 5.2 mmol/L — ABNORMAL HIGH (ref 3.5–5.1)
Sodium: 140 mmol/L (ref 135–145)

## 2022-09-17 LAB — URINALYSIS, COMPLETE (UACMP) WITH MICROSCOPIC
Bacteria, UA: NONE SEEN
Bilirubin Urine: NEGATIVE
Glucose, UA: NEGATIVE mg/dL
Hgb urine dipstick: NEGATIVE
Ketones, ur: NEGATIVE mg/dL
Leukocytes,Ua: NEGATIVE
Nitrite: NEGATIVE
Protein, ur: 100 mg/dL — AB
Specific Gravity, Urine: 1.012 (ref 1.005–1.030)
pH: 6 (ref 5.0–8.0)

## 2022-09-17 LAB — URINE DRUG SCREEN, QUALITATIVE (ARMC ONLY)
Amphetamines, Ur Screen: NOT DETECTED
Barbiturates, Ur Screen: NOT DETECTED
Benzodiazepine, Ur Scrn: NOT DETECTED
Cannabinoid 50 Ng, Ur ~~LOC~~: POSITIVE — AB
Cocaine Metabolite,Ur ~~LOC~~: NOT DETECTED
MDMA (Ecstasy)Ur Screen: NOT DETECTED
Methadone Scn, Ur: NOT DETECTED
Opiate, Ur Screen: NOT DETECTED
Phencyclidine (PCP) Ur S: NOT DETECTED
Tricyclic, Ur Screen: NOT DETECTED

## 2022-09-17 LAB — POC URINE PREG, ED: Preg Test, Ur: NEGATIVE

## 2022-09-17 LAB — TSH: TSH: 0.981 u[IU]/mL (ref 0.350–4.500)

## 2022-09-17 LAB — SALICYLATE LEVEL: Salicylate Lvl: <7 mg/dL — ABNORMAL LOW (ref 7.0–30.0)

## 2022-09-17 LAB — HIV ANTIBODY (ROUTINE TESTING W REFLEX): HIV Screen 4th Generation wRfx: NONREACTIVE

## 2022-09-17 LAB — ACETAMINOPHEN LEVEL: Acetaminophen (Tylenol), Serum: <10 ug/mL — ABNORMAL LOW (ref 10–30)

## 2022-09-17 LAB — MRSA NEXT GEN BY PCR, NASAL: MRSA by PCR Next Gen: NOT DETECTED

## 2022-09-17 LAB — MAGNESIUM
Magnesium: 2 mg/dL (ref 1.7–2.4)
Magnesium: 2 mg/dL (ref 1.7–2.4)

## 2022-09-17 LAB — POTASSIUM: Potassium: 4.6 mmol/L (ref 3.5–5.1)

## 2022-09-17 LAB — LACTIC ACID, PLASMA
Lactic Acid, Venous: 2 mmol/L (ref 0.5–1.9)
Lactic Acid, Venous: >9 mmol/L (ref 0.5–1.9)

## 2022-09-17 LAB — LIPASE, BLOOD: Lipase: 30 U/L (ref 11–51)

## 2022-09-17 LAB — GLUCOSE, CAPILLARY: Glucose-Capillary: 100 mg/dL — ABNORMAL HIGH (ref 70–99)

## 2022-09-17 LAB — ETHANOL: Alcohol, Ethyl (B): <10 mg/dL (ref <10)

## 2022-09-17 LAB — PHOSPHORUS: Phosphorus: 3.2 mg/dL (ref 2.5–4.6)

## 2022-09-17 MED ORDER — LORAZEPAM 2 MG/ML IJ SOLN: 2.0000 mg | INTRAMUSCULAR | Status: DC | PRN

## 2022-09-17 MED ORDER — SODIUM CHLORIDE 0.9 % IV BOLUS
1000.0000 mL | Freq: Once | INTRAVENOUS | Status: AC
  Administered 2022-09-17: 1000 mL via INTRAVENOUS

## 2022-09-17 MED ORDER — POLYETHYLENE GLYCOL 3350 17 G PO PACK: 17.0000 g | PACK | Freq: Every day | ORAL | Status: DC | PRN

## 2022-09-17 MED ORDER — GADOBUTROL 1 MMOL/ML IV SOLN
7.0000 mL | Freq: Once | INTRAVENOUS | Status: AC | PRN
  Administered 2022-09-17: 7.5 mL via INTRAVENOUS

## 2022-09-17 MED ORDER — ACETAMINOPHEN 325 MG PO TABS
650.0000 mg | ORAL_TABLET | Freq: Four times a day (QID) | ORAL | Status: DC | PRN
  Administered 2022-09-17 – 2022-09-18 (×5): 650 mg via ORAL
  Filled 2022-09-17 (×5): qty 2

## 2022-09-17 MED ORDER — CHLORHEXIDINE GLUCONATE CLOTH 2 % EX PADS
6.0000 | MEDICATED_PAD | Freq: Every day | CUTANEOUS | Status: DC
  Administered 2022-09-17: 6 via TOPICAL

## 2022-09-17 MED ORDER — ONDANSETRON HCL 4 MG/2ML IJ SOLN
4.0000 mg | Freq: Four times a day (QID) | INTRAMUSCULAR | Status: DC | PRN
  Administered 2022-09-17 – 2022-09-18 (×2): 4 mg via INTRAVENOUS
  Filled 2022-09-17 (×2): qty 2

## 2022-09-17 MED ORDER — SODIUM CHLORIDE 0.9 % IV SOLN
75.0000 mL/h | INTRAVENOUS | Status: DC
  Administered 2022-09-17 – 2022-09-18 (×5): 75 mL/h via INTRAVENOUS

## 2022-09-17 MED ORDER — DOCUSATE SODIUM 100 MG PO CAPS: 100.0000 mg | ORAL_CAPSULE | Freq: Two times a day (BID) | ORAL | Status: DC | PRN

## 2022-09-17 MED ORDER — TRAMADOL HCL 50 MG PO TABS
50.0000 mg | ORAL_TABLET | Freq: Four times a day (QID) | ORAL | Status: DC | PRN
  Administered 2022-09-17 – 2022-09-19 (×5): 50 mg via ORAL
  Filled 2022-09-17 (×5): qty 1

## 2022-09-17 MED ORDER — LEVETIRACETAM IN NACL 500 MG/100ML IV SOLN
500.0000 mg | Freq: Two times a day (BID) | INTRAVENOUS | Status: DC
  Administered 2022-09-17 – 2022-09-19 (×5): 500 mg via INTRAVENOUS
  Filled 2022-09-17 (×7): qty 100

## 2022-09-17 NOTE — ED Notes (Signed)
ICU NP at bedside speaking with mother.

## 2022-09-17 NOTE — H&P (Addendum)
NAME:  Heather [name], MRN:  [id number], DOB:  07-Aug-1996, LOS: 0 ADMISSION DATE:  09/16/2022, CONSULTATION DATE:  09/17/2022 REFERRING MD:  Loleta Rose  CHIEF COMPLAINT:  Seizures    HPI  26 y.o female with significant PMH of seizures on Tegretol, polysubstance abuse, migraine headaches, and Appendicitis s/p appendectomy who presented to the ED with multiples episodes of seizure activity.  Per patient's mother who is currently at the bedside, the patient had a witnessed syncopal episode with complete LOC that lasted approximately 1 minute. The patient's mother states that her daughter was complaining of being tired and when she stood to go to her room she collapsed on the living room floor and was having what appeared to be generalized tonic clonic seizures. Patient's mother immediately called 911 and on EMS arrival, patient was found sitting upright on the couch. She was alert/oriented and in no acute distress. Her respiratory rate/effort were even/unlabored and she was diaphoretic. Patient has known history of seizures and per chart records, her last seizures was in 2021 and was reported that she took herself off Tegretol in 2018.   ED Course: Initial vital signs showed HR of 97 beats/minute, BP 137/89 mm Hg, the RR 21 breaths/minute, and the oxygen saturation 100 % on 2L and a temperature of 98.18F (36.7C). Patient had three more episodes in the ED that was witnessed by nursing staff. Per staff, The patient was on the pulse oximeter and her oxygen saturations dropped quickly to the 70s as she froth at the mouth and had a generalized tonic-clonic seizure. Patient received a total of 6 mg og IV ativan and was loaded with IV Keppra. Given high risk for recurrent seizures and possible intubation, PCCM was consulted for admission.  Pertinent Labs/Diagnostics Findings: Chemistry:Na+/ K+: 141/2.8  Glucose:101 otherwise unremarkable CBC: Unremarkable Other Lab findings:   UDS +Marijuana,  Lactate>9, Acetaminophen level <10, Ethanol <10, Salicylate levels <7.0 Imaging:  CXR> No active disease CTH> No acute intracranial process. CT cervical spine>No acute fracture or traumatic listhesis in the cervical spine.   Past Medical History   Smoker    Obesity BMI=30.0 05/05/2021  Migraines dx'd 2016 05/05/2021  Marijuana user 07/15/2020  Gestational hypertension 04/01/19 04/01/2019  Seizure (HCC) since age 64; last seizure 07/2020; self d/c'd Tegretal 2018 02/28/2018  Herpes 05/24/16 05/24/2016  Acne 12/06/2015  Family history of breast cancer in female 11/30/2015  Eczema    Significant Hospital Events   11/11: Admit to ICU with breakthrough seizures  Consults:  Neurology  Procedures:  None  Significant Diagnostic Tests:  11/11: Chest Xray>No active disease  11/11: Noncontrast CT head> No acute intracranial process 11/11: CT Cervical spine>No acute fracture or traumatic listhesis in the cervical spine.  Micro Data:  None  Antimicrobials:  None  OBJECTIVE  Blood pressure (!) 141/95, pulse 68, temperature 98.1 F (36.7 C), resp. rate (!) 26, height 5\' 6"  (1.676 m), weight 59 kg, SpO2 100 %, unknown if currently breastfeeding.        Intake/Output Summary (Last 24 hours) at 09/17/2022 0157 Last data filed at 09/17/2022 0128 Gross per 24 hour  Intake 2400 ml  Output --  Net 2400 ml   Filed Weights   09/16/22 2347  Weight: 59 kg   Physical Examination  GENERAL: 26 year-old critically ill patient lying in the bed with no acute distress.  EYES: Pupils equal, round, reactive to light and accommodation. No scleral icterus. Extraocular muscles intact.  HEENT: Head atraumatic, normocephalic. Oropharynx and  nasopharynx clear.  NECK:  Supple, no jugular venous distention. No thyroid enlargement, no tenderness.  LUNGS: Normal breath sounds bilaterally, no wheezing, rales,rhonchi or crepitation. No use of accessory muscles of respiration.  CARDIOVASCULAR: S1, S2 normal. No  murmurs, rubs, or gallops.  ABDOMEN: Soft, nontender, nondistended. Bowel sounds present. No organomegaly or mass.  EXTREMITIES: Upper and lower extremities are atraumatic in appearance without tenderness or deformity. No swelling or erythema. Pulses palpable.  NEUROLOGIC:The patient is drowsy unable to answer orientation question. Opens eyes to deep sternal rub. Unable to assess motor function. Withdraws all extremities to noxious stimuli. Reflexes 2+ bilaterally. Cranial nerves are intact. Gait not checked.  PSYCHIATRIC:The patient is lethargic SKIN: No obvious rash, lesion, or ulcer.   Labs/imaging that I havepersonally reviewed  (right click and "Reselect all SmartList Selections" daily)     Labs   CBC: Recent Labs  Lab 09/16/22 2304  WBC 8.1  NEUTROABS 4.1  HGB 13.5  HCT 41.6  MCV 91.2  PLT 230    Basic Metabolic Panel: Recent Labs  Lab 09/16/22 2304  NA 141  K 2.8*  CL 109  CO2 25  GLUCOSE 101*  BUN 11  CREATININE 0.91  CALCIUM 9.1  MG 2.0   GFR: Estimated Creatinine Clearance: 88.8 mL/min (by C-G formula based on SCr of 0.91 mg/dL). Recent Labs  Lab 09/16/22 2304 09/16/22 2334 09/17/22 0119  WBC 8.1  --   --   LATICACIDVEN  --  >9.0* 2.0*    Liver Function Tests: Recent Labs  Lab 09/16/22 2304  AST 31  ALT 45*  ALKPHOS 73  BILITOT 0.7  PROT 7.4  ALBUMIN 4.3   Recent Labs  Lab 09/16/22 2304  LIPASE 30   No results for input(s): "AMMONIA" in the last 168 hours.  ABG No results found for: "PHART", "PCO2ART", "PO2ART", "HCO3", "TCO2", "ACIDBASEDEF", "O2SAT"   Coagulation Profile: Recent Labs  Lab 09/16/22 2303  INR 1.0    Cardiac Enzymes: No results for input(s): "CKTOTAL", "CKMB", "CKMBINDEX", "TROPONINI" in the last 168 hours.  HbA1C: No results found for: "HGBA1C"  CBG: No results for input(s): "GLUCAP" in the last 168 hours.  Review of Systems:   UNABLE TO OBTAIN DUE TO ALTERED MENTAL STATUS  Past Medical History  She,   has a past medical history of Seizures (HCC).   Surgical History    Past Surgical History:  Procedure Laterality Date   APPENDECTOMY  07/11/2021     Social History   reports that she has been smoking cigarettes. She has been smoking an average of .14 packs per day. She has never used smokeless tobacco. She reports that she does not currently use alcohol after a past usage of about 3.0 standard drinks of alcohol per week. She reports current drug use. Drug: Marijuana.   Family History   Her family history includes Bipolar disorder in her mother; Cancer in her maternal grandmother and maternal uncle; Hyperlipidemia in her father and paternal grandmother; Hypertension in her father and paternal grandmother; OCD in her mother. There is no history of COPD, Diabetes, Heart disease, or Stroke.   Allergies No Known Allergies   Home Medications  Prior to Admission medications   Medication Sig Start Date End Date Taking? Authorizing Provider  acetaminophen (TYLENOL) 325 MG tablet Take 2 tablets (650 mg total) by mouth every 4 (four) hours as needed (for pain scale < 4). 12/08/20   Zipporah Plants, CNM  ibuprofen (ADVIL) 600 MG tablet Take 1 tablet (600  mg total) by mouth every 6 (six) hours. 12/08/20   Zipporah Plants, CNM  norethindrone (MICRONOR) 0.35 MG tablet Take 1 tablet (0.35 mg total) by mouth daily. Patient not taking: Reported on 10/14/2021 05/05/21   Alberteen Spindle, CNM  Prenatal Vit-Fe Fumarate-FA (PRENATAL VITAMIN) 27-0.8 MG TABS Take 1 tablet by mouth daily. Patient not taking: Reported on 05/05/2021 04/22/20   Federico Flake, MD  Scheduled Meds:  medroxyPROGESTERone  150 mg Intramuscular Q90 days   medroxyPROGESTERone  150 mg Intramuscular Q90 days   Continuous Infusions:  sodium chloride     levETIRAcetam     potassium chloride 100 mL/hr at 09/17/22 0130   PRN Meds:.docusate sodium, LORazepam, LORazepam, polyethylene glycol  Active Hospital Problem list     Assessment  & Plan:  Status Epilepticus- Likely breakthrough in the setting of possible drug intoxication ? PMHx: Seizure since age 37, last seizures in 2021, stopped Tegretol in 2018 -Supplemental O2 as needed to maintain O2 saturations > 92% -High risk for intubation -Assess EEG  -Initial CT head negative for acute intracranial abnormality -Loaded with Keppra in the ED will continue maintenance 500 mg twice daily -PRN Lorazepam for breakthrough seizures -Seizure precautions -Repeat CT head or MRI brain if with patient continues to have more episodes of seizures, and consider transferring to tertiary center for continuous EEG monitoring. -Neuro consult, in the morning  Acute Metabolic Encephalopathy in the setting of above -Provide supportive care -CT Head negative for acute intracranial abnormality   Lactic Acidosis likely in the setting of seizures~ IMPROVING Hypokalemia -Trend Lactate -IVFs hydration -Monitor I&O's / urinary output -Follow BMP -Replace electrolytes as indicated  Post Seizures Headaches Hx of Migraine Headaches -PRN pain medication  Polysubstance Abuse Hx: current everyday smoker, Marijuana -UDS + Marijuana -Counseling once stable   Best practice:  Diet:  NPO Pain/Anxiety/Delirium protocol (if indicated): No VAP protocol (if indicated): Not indicated DVT prophylaxis: Contraindicated GI prophylaxis: N/A Glucose control:  SSI No Central venous access:  N/A Arterial line:  N/A Foley:  N/A Mobility:  bed rest  PT consulted: N/A Last date of multidisciplinary goals of care discussion [11/11] Code Status:  full code Disposition: ICU   = Goals of Care = Code Status Order: FULL  Primary Emergency Contact: Csaszar,Genese Wishes to pursue full aggressive treatment and intervention options, including CPR and intubation, but goals of care will be addressed on going with family if that should become necessary.  Critical care time: 45 minutes       Webb Silversmith, DNP, CCRN, FNP-C, AGACNP-BC Acute Care Nurse Practitioner [facility]  PCCM on call pager [phone] until 7 am

## 2022-09-17 NOTE — Progress Notes (Addendum)
Pt refuse COVID test swab and throw the material away. Yelling and using foul language. Pt was educated and she said' I don't care"

## 2022-09-17 NOTE — ED Notes (Signed)
Cardiac rhythm noted to be irregular. New EKG printed and given to Dr. York Cerise.

## 2022-09-17 NOTE — ED Notes (Signed)
ED Provider at bedside. 

## 2022-09-17 NOTE — ED Notes (Addendum)
Patient transported back from CT 

## 2022-09-17 NOTE — ED Notes (Signed)
Pt c/o coban wrap causing arm to hurt.informed PTs & family that we'll inspect that wrap soon.

## 2022-09-17 NOTE — ED Notes (Signed)
IV team at bedside 

## 2022-09-17 NOTE — Consult Note (Signed)
PHARMACY CONSULT NOTE - FOLLOW UP  Pharmacy Consult for Electrolyte Monitoring and Replacement   Recent Labs: Potassium (mmol/L)  Date Value  09/17/2022 5.2 (H)  04/08/2014 3.8   Magnesium (mg/dL)  Date Value  12/87/8676 2.0   Calcium (mg/dL)  Date Value  72/07/4708 8.3 (L)   Calcium, Total (mg/dL)  Date Value  62/83/6629 9.2 (L)   Albumin (g/dL)  Date Value  47/65/4650 4.3  04/08/2014 4.4   Phosphorus (mg/dL)  Date Value  35/46/5681 3.2   Sodium (mmol/L)  Date Value  09/17/2022 140  04/08/2014 139     Assessment: 26 y.o female with significant PMH of seizures on Tegretol, polysubstance abuse, migraine headaches, and Appendicitis s/p appendectomy who presented to the ED with multiples episodes of seizure activity. Seizures possibly due to in the setting of seizures per provider note.   K+ replaced on 11/11 and lab was drawn while KCL was running  Goal of Therapy:  WNL  Plan:  No replacement needed at this time.  F/u with AM labs.   Ronnald Ramp ,PharmD Clinical Pharmacist 09/17/2022 7:43 AM

## 2022-09-17 NOTE — ED Notes (Signed)
Checked on pt, she appears to be resting in no obvious distress. Mother is at the bedside and denies needs at this time. She has been updated on the plan of care.

## 2022-09-18 ENCOUNTER — Inpatient Hospital Stay: Payer: Medicaid Other

## 2022-09-18 DIAGNOSIS — R7989 Other specified abnormal findings of blood chemistry: Secondary | ICD-10-CM

## 2022-09-18 DIAGNOSIS — R001 Bradycardia, unspecified: Secondary | ICD-10-CM

## 2022-09-18 DIAGNOSIS — G40901 Epilepsy, unspecified, not intractable, with status epilepticus: Secondary | ICD-10-CM | POA: Diagnosis not present

## 2022-09-18 DIAGNOSIS — I609 Nontraumatic subarachnoid hemorrhage, unspecified: Secondary | ICD-10-CM | POA: Diagnosis not present

## 2022-09-18 DIAGNOSIS — R569 Unspecified convulsions: Secondary | ICD-10-CM

## 2022-09-18 LAB — CBC WITH DIFFERENTIAL/PLATELET
Abs Immature Granulocytes: 0.04 10*3/uL (ref 0.00–0.07)
Basophils Absolute: 0 10*3/uL (ref 0.0–0.1)
Basophils Relative: 0 %
Eosinophils Absolute: 0 10*3/uL (ref 0.0–0.5)
Eosinophils Relative: 0 %
HCT: 35.9 % — ABNORMAL LOW (ref 36.0–46.0)
Hemoglobin: 12 g/dL (ref 12.0–15.0)
Immature Granulocytes: 1 %
Lymphocytes Relative: 18 %
Lymphs Abs: 1.5 10*3/uL (ref 0.7–4.0)
MCH: 30.3 pg (ref 26.0–34.0)
MCHC: 33.4 g/dL (ref 30.0–36.0)
MCV: 90.7 fL (ref 80.0–100.0)
Monocytes Absolute: 0.3 10*3/uL (ref 0.1–1.0)
Monocytes Relative: 4 %
Neutro Abs: 6.5 10*3/uL (ref 1.7–7.7)
Neutrophils Relative %: 77 %
Platelets: 178 10*3/uL (ref 150–400)
RBC: 3.96 MIL/uL (ref 3.87–5.11)
RDW: 12.5 % (ref 11.5–15.5)
WBC: 8.5 10*3/uL (ref 4.0–10.5)
nRBC: 0 % (ref 0.0–0.2)

## 2022-09-18 LAB — BASIC METABOLIC PANEL
Anion gap: 3 — ABNORMAL LOW (ref 5–15)
BUN: 7 mg/dL (ref 6–20)
CO2: 22 mmol/L (ref 22–32)
Calcium: 8.5 mg/dL — ABNORMAL LOW (ref 8.9–10.3)
Chloride: 113 mmol/L — ABNORMAL HIGH (ref 98–111)
Creatinine, Ser: 0.67 mg/dL (ref 0.44–1.00)
GFR, Estimated: >60 mL/min (ref >60)
Glucose, Bld: 96 mg/dL (ref 70–99)
Potassium: 4 mmol/L (ref 3.5–5.1)
Sodium: 138 mmol/L (ref 135–145)

## 2022-09-18 LAB — PHOSPHORUS: Phosphorus: 3.1 mg/dL (ref 2.5–4.6)

## 2022-09-18 LAB — MAGNESIUM: Magnesium: 2 mg/dL (ref 1.7–2.4)

## 2022-09-18 MED ORDER — NICOTINE POLACRILEX 2 MG MT GUM
2.0000 mg | CHEWING_GUM | OROMUCOSAL | Status: DC
  Administered 2022-09-18 (×2): 2 mg via ORAL
  Filled 2022-09-18 (×6): qty 1

## 2022-09-18 NOTE — Progress Notes (Addendum)
PROGRESS NOTE  [name]    DOB: Feb 02, 1996, 26 y.o.  NGE:[id number]    Code Status: Full Code   DOA: 09/16/2022   LOS: 1   Brief hospital course  Jarrah Peachie Barkalow is a 26 y.o. female with a PMH significant for seizures previously on Tegretol, polysubstance abuse, migraine headaches, and Appendicitis s/p appendectomy.  They presented from home to the ED on 09/16/2022 with seizures x 1 days. Generalized witnessed seizure with fall from standing height. Last reported seizure was 2021. Self discontinued off tegretol since 2018, per chart review.  In the ED, it was found that they had HR of 97 beats/minute, BP 137/89 mm Hg, the RR 21 breaths/minute, and the oxygen saturation 100 % on 2L and a temperature of 98.159F (36.7C) .   Significant findings included several more witnessed seizures upon arrival to ED with hypoxia during episodes. Aborted with IV ativan.  Neurology was consulted. She was loaded with keppra.   HR of 97 beats/minute, BP 137/89 mm Hg, the RR 21 breaths/minute, and the oxygen saturation 100 % on 2L and a temperature of 98.159F (36.7C)   Patient was admitted to medicine service for further workup and management of status epilepticus as outlined in detail below.  09/18/22 -without repeat seizures  Assessment & Plan  Principal Problem:   Seizure [facility]) since age 26; last seizure 07/2020; self d/c'd Tegretal 2018  Status Epilepticus-  PMHx: Seizure since age 81, last seizures in 2021, stopped Tegretol in 2018. Seizure free overnight/today - neurology following, Dr. Selina Cooley. appreciate your recs  - MRA to evaluate abnormality seen on MRI unremarkable  - EEG  - continue keppra  - ativan PRN   Acute Metabolic Encephalopathy in the setting of above- resolved. CT Head negative for acute intracranial abnormality -Provide supportive care   Bradycardia- sinus bradycardia on telemetry. Patient is asymptomatic. Consistently in 40s and 50s. Not her baseline. Unknown  cause.  - cardiology consulted, Dr. Mariah Milling - continue telemetry  N/V- tolerating PO. Urine pregnancy on presentation negative. - antiemetics PRN  Hypokalemia -monitor and Replace electrolytes as indicated   Polysubstance Abuse Hx: current everyday smoker, Marijuana - nicotine replacement PRN  Body mass index is 26.3 kg/m.  VTE ppx: SCDs Start: 09/17/22 0135, early ambulation  Diet:     Diet   Diet Carb Modified Fluid consistency: Thin; Room service appropriate? Yes   Consultants: Neurology  Cardiology   Subjective 09/18/22    Pt reports nausea. Wants to have nicotine gum and have diet put on. Anxious to go home.   Objective   Vitals:   09/18/22 1100 09/18/22 1107 09/18/22 1200 09/18/22 1500  BP: 124/82  113/84 116/79  Pulse:  (!) 49 88 (!) 43  Resp: 17  15   Temp: 97.9 F (36.6 C)     TempSrc: Oral     SpO2: 100%  100% 98%  Weight:      Height:        Intake/Output Summary (Last 24 hours) at 09/18/2022 1555 Last data filed at 09/18/2022 1233 Gross per 24 hour  Intake 1906.25 ml  Output 450 ml  Net 1456.25 ml   Filed Weights   09/16/22 2347 09/17/22 0810  Weight: 59 kg 73.9 kg     Physical Exam:  General: awake, alert, NAD HEENT: atraumatic, clear conjunctiva, anicteric sclera, MMM, hearing grossly normal Respiratory: normal respiratory effort. Cardiovascular: quick capillary refill, normal S1/S2, RRR, no JVD, murmur Nervous: A&O x3. no gross focal neurologic deficits, normal  speech Extremities: moves all equally, no edema, normal tone Skin: dry, intact, normal temperature, normal color. No rashes, lesions or ulcers on exposed skin Psychiatry: flat mood, congruent affect  Labs   I have personally reviewed the following labs and imaging studies CBC    Component Value Date/Time   WBC 8.5 09/18/2022 0445   RBC 3.96 09/18/2022 0445   HGB 12.0 09/18/2022 0445   HGB 11.2 08/18/2020 1508   HCT 35.9 (L) 09/18/2022 0445   HCT 32.9 (L) 08/18/2020  1508   PLT 178 09/18/2022 0445   PLT 284 08/18/2020 1508   MCV 90.7 09/18/2022 0445   MCV 92 08/18/2020 1508   MCV 88 04/08/2014 1903   MCH 30.3 09/18/2022 0445   MCHC 33.4 09/18/2022 0445   RDW 12.5 09/18/2022 0445   RDW 12.0 08/18/2020 1508   RDW 13.9 04/08/2014 1903   LYMPHSABS 1.5 09/18/2022 0445   LYMPHSABS 1.3 01/23/2019 1017   MONOABS 0.3 09/18/2022 0445   EOSABS 0.0 09/18/2022 0445   EOSABS 0.0 01/23/2019 1017   BASOSABS 0.0 09/18/2022 0445   BASOSABS 0.0 01/23/2019 1017      Latest Ref Rng & Units 09/18/2022    4:45 AM 09/17/2022    8:37 AM 09/17/2022    5:16 AM  BMP  Glucose 70 - 99 mg/dL 96   366   BUN 6 - 20 mg/dL 7   8   Creatinine 2.94 - 1.00 mg/dL 7.65   4.65   Sodium 035 - 145 mmol/L 138   140   Potassium 3.5 - 5.1 mmol/L 4.0  4.6  5.2   Chloride 98 - 111 mmol/L 113   114   CO2 22 - 32 mmol/L 22   23   Calcium 8.9 - 10.3 mg/dL 8.5   8.3     MR ANGIO HEAD WO CONTRAST  Result Date: 09/18/2022 CLINICAL DATA:  26 year old female with seizures. Suspicion of trace intracranial hemorrhage on MRI yesterday. EXAM: MRA HEAD WITHOUT CONTRAST TECHNIQUE: Angiographic images of the [place] were acquired using MRA technique without intravenous contrast. COMPARISON:  Brain MRI 09/17/2022 and head CT earlier the same day. FINDINGS: Anterior circulation: Antegrade flow in both ICA siphons with no evidence of siphon stenosis. Ophthalmic and posterior communicating artery origins appear normal. Patent carotid termini. MCA and ACA origins appear normal. Left ACA is mildly dominant throughout. Diminutive or absent anterior communicating artery. Visible ACA branches are within normal limits. Left MCA M1 segment and trifurcation are patent without stenosis. Left MCA branches are within normal limits. Right MCA M1 segment and bifurcation are patent without stenosis. Right MCA branches are within normal limits. Posterior circulation: Antegrade flow in the posterior circulation  with fairly codominant distal vertebral arteries. PICA origins and vertebrobasilar junction appear patent without stenosis. Patent basilar artery without evidence of stenosis. Patent SCA and PCA origins. Fetal type right PCA with smaller left posterior communicating artery. Bilateral PCA branches are within normal limits. Anatomic variants: Fetal type right PCA origin. Mildly dominant left ACA. Other: Right middle frontal gyrus area of abnormal MRI signal is subtle by MRA on series 5, image 171. No regional abnormal vascularity or flow signal. Small regional peripheral arteries and veins appear within normal limits. No intracranial mass effect or ventriculomegaly. IMPRESSION: Negative intracranial MRA; with no MRA abnormality corresponding to the cortical abnormality in the periphery of the right middle frontal gyrus. Electronically Signed   By: Odessa Fleming M.D.   On: 09/18/2022 09:41   MR  CERVICAL SPINE WO CONTRAST  Result Date: 09/17/2022 CLINICAL DATA:  Acute neck pain.  Recent fall with seizure EXAM: MRI CERVICAL SPINE WITHOUT CONTRAST TECHNIQUE: Multiplanar, multisequence MR imaging of the cervical spine was performed. No intravenous contrast was administered. COMPARISON:  None Available. FINDINGS: Alignment: Physiologic. Vertebrae: No fracture, evidence of discitis, or bone lesion. Cord: Normal signal and morphology. Posterior Fossa, vertebral arteries, paraspinal tissues: Negative. Disc levels: C1-2: Unremarkable. C2-3: Normal disc space and facet joints. There is no spinal canal stenosis. No neural foraminal stenosis. C3-4: Normal disc space and facet joints. There is no spinal canal stenosis. No neural foraminal stenosis. C4-5: Normal disc space and facet joints. There is no spinal canal stenosis. No neural foraminal stenosis. C5-6: Normal disc space and facet joints. There is no spinal canal stenosis. No neural foraminal stenosis. C6-7: Normal disc space and facet joints. There is no spinal canal  stenosis. No neural foraminal stenosis. C7-T1: Normal disc space and facet joints. There is no spinal canal stenosis. No neural foraminal stenosis. IMPRESSION: Normal MRI of the cervical spine. Electronically Signed   By: Deatra Robinson M.D.   On: 09/17/2022 20:25   MR BRAIN W WO CONTRAST  Result Date: 09/17/2022 CLINICAL DATA:  Seizure disorder EXAM: MRI HEAD WITHOUT AND WITH CONTRAST TECHNIQUE: Multiplanar, multiecho pulse sequences of the brain and surrounding structures were obtained without and with intravenous contrast. CONTRAST:  7.1mL GADAVIST GADOBUTROL 1 MMOL/ML IV SOLN COMPARISON:  Head CT 09/17/2022 FINDINGS: Brain: There is a small focus of hyperintense T2-weighted signal along the lateral anterior right convexity, that appears to occupy the subarachnoid space. There is no associated contrast enhancement or correlate on the earlier head CT. Normal white matter signal, parenchymal volume and CSF spaces. The midline structures are normal. Vascular: Major flow voids are preserved. Skull and upper cervical spine: Normal calvarium and skull base. Visualized upper cervical spine and soft tissues are normal. Sinuses/Orbits:No paranasal sinus fluid levels or advanced mucosal thickening. No mastoid or middle ear effusion. Normal orbits. IMPRESSION: 1. Small focus of hyperintense T2-weighted signal along the lateral anterior right convexity, that appears to occupy the subarachnoid space. This is favored to be a small focus of subarachnoid blood; however, a vascular lesion such is a cavernous malformation or an arteriovenous malformation are other possibilities. MRA of the head might be helpful. 2. Otherwise normal brain MRI. Electronically Signed   By: Deatra Robinson M.D.   On: 09/17/2022 20:09   DG Chest Port 1 View  Result Date: 09/17/2022 CLINICAL DATA:  Questionable sepsis. EXAM: PORTABLE CHEST 1 VIEW COMPARISON:  Chest radiograph dated 07/20/2017. FINDINGS: No focal consolidation, pleural effusion or  pneumothorax. The cardiac silhouette there is within normal limits. No acute osseous pathology. IMPRESSION: No active disease. Electronically Signed   By: Elgie Collard M.D.   On: 09/17/2022 00:40   CT Head Wo Contrast  Result Date: 09/17/2022 CLINICAL DATA:  Head and neck trauma EXAM: CT HEAD WITHOUT CONTRAST CT CERVICAL SPINE WITHOUT CONTRAST TECHNIQUE: Multidetector CT imaging of the head and cervical spine was performed following the standard protocol without intravenous contrast. Multiplanar CT image reconstructions of the cervical spine were also generated. RADIATION DOSE REDUCTION: This exam was performed according to the departmental dose-optimization program which includes automated exposure control, adjustment of the mA and/or kV according to patient size and/or use of iterative reconstruction technique. COMPARISON:  05/25/2011 CT head FINDINGS: CT HEAD FINDINGS Brain: No evidence of acute infarct, hemorrhage, mass, mass effect, or midline shift. No hydrocephalus  or extra-axial fluid collection. Normal pituitary and craniocervical junction. Vascular: No hyperdense vessel. Skull: Normal. Negative for fracture or focal lesion. Sinuses/Orbits: No acute finding. Other: The mastoid air cells are well aerated. CT CERVICAL SPINE FINDINGS Alignment: No listhesis. Mild reversal of the normal cervical lordosis, which may be positional. Skull base and vertebrae: No acute fracture. No primary bone lesion or focal pathologic process. Soft tissues and spinal canal: No prevertebral fluid or swelling. No visible canal hematoma. Disc levels:  Disc heights are preserved.  No spinal canal stenosis. Upper chest: Negative. Other: None. IMPRESSION: 1. No acute intracranial process. 2. No acute fracture or traumatic listhesis in the cervical spine. Electronically Signed   By: Wiliam KeAlison  Vasan M.D.   On: 09/17/2022 00:27   CT Cervical Spine Wo Contrast  Result Date: 09/17/2022 CLINICAL DATA:  Head and neck trauma EXAM:  CT HEAD WITHOUT CONTRAST CT CERVICAL SPINE WITHOUT CONTRAST TECHNIQUE: Multidetector CT imaging of the head and cervical spine was performed following the standard protocol without intravenous contrast. Multiplanar CT image reconstructions of the cervical spine were also generated. RADIATION DOSE REDUCTION: This exam was performed according to the departmental dose-optimization program which includes automated exposure control, adjustment of the mA and/or kV according to patient size and/or use of iterative reconstruction technique. COMPARISON:  05/25/2011 CT head FINDINGS: CT HEAD FINDINGS Brain: No evidence of acute infarct, hemorrhage, mass, mass effect, or midline shift. No hydrocephalus or extra-axial fluid collection. Normal pituitary and craniocervical junction. Vascular: No hyperdense vessel. Skull: Normal. Negative for fracture or focal lesion. Sinuses/Orbits: No acute finding. Other: The mastoid air cells are well aerated. CT CERVICAL SPINE FINDINGS Alignment: No listhesis. Mild reversal of the normal cervical lordosis, which may be positional. Skull base and vertebrae: No acute fracture. No primary bone lesion or focal pathologic process. Soft tissues and spinal canal: No prevertebral fluid or swelling. No visible canal hematoma. Disc levels:  Disc heights are preserved.  No spinal canal stenosis. Upper chest: Negative. Other: None. IMPRESSION: 1. No acute intracranial process. 2. No acute fracture or traumatic listhesis in the cervical spine. Electronically Signed   By: Wiliam KeAlison  Vasan M.D.   On: 09/17/2022 00:27    Disposition Plan & Communication  Patient status: Inpatient  Admitted From: Home Planned disposition location: Home Anticipated discharge date: 11/14 pending cardiac and neurological evaluation  Family Communication: mom at bedside Author: Leeroy Bockhelsey L Devan Danzer, DO [facility] 09/18/2022, 3:55 PM   Available by Epic secure chat 7AM-7PM. If 7PM-7AM, please contact  night-coverage.  [facility] contact information found on [email or web address].

## 2022-09-18 NOTE — Consult Note (Signed)
PHARMACY CONSULT NOTE - FOLLOW UP  Pharmacy Consult for Electrolyte Monitoring and Replacement   Recent Labs: Potassium (mmol/L)  Date Value  09/18/2022 4.0  04/08/2014 3.8   Magnesium (mg/dL)  Date Value  09/81/1914 2.0   Calcium (mg/dL)  Date Value  78/29/5621 8.5 (L)   Calcium, Total (mg/dL)  Date Value  30/86/5784 9.2 (L)   Albumin (g/dL)  Date Value  69/62/9528 4.3  04/08/2014 4.4   Phosphorus (mg/dL)  Date Value  41/32/4401 3.1   Sodium (mmol/L)  Date Value  09/18/2022 138  04/08/2014 139     Assessment: 26 y.o female with significant PMH of seizures on Tegretol, polysubstance abuse, migraine headaches, and Appendicitis s/p appendectomy who presented to the ED with multiples episodes of seizure activity. Seizures possibly due to in the setting of seizures per provider note.    Goal of Therapy:  WNL  Plan:  No replacement needed at this time.  F/u with AM labs.   Ronnald Ramp ,PharmD Clinical Pharmacist 09/18/2022 8:08 AM

## 2022-09-18 NOTE — Progress Notes (Signed)
Pt A&OX4. 2 vomiting episodes this shift and prn zofran administered. Diet ordered. VSS but HR SB/irregular brady, not symptomatic. MD made aware of HR and cardiology consulted by MD. Per pt she wishes to smoke, RN verbalized no smoking on campus and MD ordered Nicotine gum. No seizures this shift. Report called to [facility] on 2C. RN will transport pt via floor care bed.

## 2022-09-18 NOTE — Consult Note (Signed)
NEUROLOGY CONSULTATION NOTE   Date of service: September 18, 2022 Patient Name: Heather [name] MRN:  [id number] DOB:  1996/01/07 Reason for consult: seizure Requesting physician: Dr. Jamelle Rushing _ _ _   _ __   _ __ _ _  __ __   _ __   __ _  History of Present Illness   26 yo woman with hx seizures since age of 67, noncompliant for yrs, not currently on AEDs 2/2 self-discontinuation, who presented after GTC seizure at home with head trauma and had 3 subsequent GTCs in ED after arrival. Initial head CT was interpreted as negative. In the past patient has been on at least 3 different seizure medications, keppra, tegretol, and lamotrigine, all or most of which were self-discontinued 2/2 lethargy (unclear why she stopped lamotrigine).  MRI brain wo contrast Small focus of hyperintense T2-weighted signal along the lateral anterior right convexity, that appears to occupy the subarachnoid space. This is favored to be a small focus of subarachnoid blood; however, a vascular lesion such is a cavernous malformation or an arteriovenous malformation are other possibilities. MRA of the head might be helpful.  MRA head - no vasc malformation  MRI c spine wo - WNL  All CNS imaging personally reviewed.  She is now back to baseline. Reports mild headache 5/10 and neck soreness.   ROS   Per HPI: all other systems reviewed and are negative  Past History   I have reviewed the following:  Past Medical History:  Diagnosis Date   Seizures (HCC)    last seizure November 2019   Past Surgical History:  Procedure Laterality Date   APPENDECTOMY  07/11/2021   Family History  Problem Relation Age of Onset   Bipolar disorder Mother    OCD Mother    Hypertension Father    Hyperlipidemia Father    Cancer Maternal Grandmother        breast   Hypertension Paternal Grandmother    Hyperlipidemia Paternal Grandmother    Cancer Maternal Uncle        breast   COPD Neg Hx    Diabetes Neg Hx     Heart disease Neg Hx    Stroke Neg Hx    Social History   Socioeconomic History   Marital status: Single    Spouse name: Not on file   Number of children: 1   Years of education: 14   Highest education level: High school graduate  Occupational History   Occupation: RETAIL Investment banker, corporate: [employer]    Comment: FULL TIME  Tobacco Use   Smoking status: Every Day    Packs/day: 0.14    Types: Cigarettes   Smokeless tobacco: Never  Vaping Use   Vaping Use: Never used  Substance and Sexual Activity   Alcohol use: Not Currently    Alcohol/week: 3.0 standard drinks of alcohol    Types: 3 Standard drinks or equivalent per week    Comment: last use 01/22/22 (2 bottles Petrone) qo weekend   Drug use: Yes    Types: Marijuana    Comment: 3x a week   Sexual activity: Yes    Partners: Male    Birth control/protection: None  Other Topics Concern   Not on file  Social History Narrative   Not on file   Social Determinants of Health   Financial Resource Strain: Not on file  Food Insecurity: No Food Insecurity (09/17/2022)   Hunger Vital Sign    Worried About Running  Out of Food in the Last Year: Never true    Ran Out of Food in the Last Year: Never true  Transportation Needs: No Transportation Needs (09/17/2022)   PRAPARE - Administrator, Civil ServiceTransportation    Lack of Transportation (Medical): No    Lack of Transportation (Non-Medical): No  Physical Activity: Not on file  Stress: Not on file  Social Connections: Not on file   No Known Allergies  Medications   Facility-Administered Medications Prior to Admission  Medication Dose Route Frequency Provider Last Rate Last Admin   medroxyPROGESTERone (DEPO-PROVERA) injection 150 mg  150 mg Intramuscular Q90 days Wendi SnipesGarner, Meteea, FNP   150 mg at 10/14/21 1523   medroxyPROGESTERone (DEPO-PROVERA) injection 150 mg  150 mg Intramuscular Q90 days Glenna FellowsWhite, Ayo, FNP   150 mg at 08/01/22 1717   Medications Prior to Admission  Medication Sig Dispense  Refill Last Dose   acetaminophen (TYLENOL) 325 MG tablet Take 2 tablets (650 mg total) by mouth every 4 (four) hours as needed (for pain scale < 4).   prn   ibuprofen (ADVIL) 600 MG tablet Take 1 tablet (600 mg total) by mouth every 6 (six) hours. 30 tablet 0 prn   norethindrone (MICRONOR) 0.35 MG tablet Take 1 tablet (0.35 mg total) by mouth daily. (Patient not taking: Reported on 10/14/2021) 28 tablet 13    Prenatal Vit-Fe Fumarate-FA (PRENATAL VITAMIN) 27-0.8 MG TABS Take 1 tablet by mouth daily. (Patient not taking: Reported on 05/05/2021) 100 tablet 0       Current Facility-Administered Medications:    0.9 %  sodium chloride infusion, 75 mL/hr, Intravenous, Continuous, Ouma, Hubbard HartshornElizabeth Achieng, NP, Last Rate: 75 mL/hr at 09/18/22 0939, 75 mL/hr at 09/18/22 0939   acetaminophen (TYLENOL) tablet 650 mg, 650 mg, Oral, Q6H PRN, Ezequiel EssexNelson, Dana G, NP, 650 mg at 09/18/22 1439   Chlorhexidine Gluconate Cloth 2 % PADS 6 each, 6 each, Topical, Daily, Vida RiggerAleskerov, Fuad, MD, 6 each at 09/17/22 0839   levETIRAcetam (KEPPRA) IVPB 500 mg/100 mL premix, 500 mg, Intravenous, Q12H, Ouma, Hubbard HartshornElizabeth Achieng, NP, Last Rate: 400 mL/hr at 09/18/22 0943, 500 mg at 09/18/22 0943   LORazepam (ATIVAN) injection 2 mg, 2 mg, Intravenous, Once PRN, Loleta RoseForbach, Cory, MD   LORazepam (ATIVAN) injection 2 mg, 2 mg, Intravenous, Q1H PRN, Jimmye Normanuma, Elizabeth Achieng, NP   LORazepam (ATIVAN) injection 4 mg, 4 mg, Intravenous, Once PRN, Loleta RoseForbach, Cory, MD   nicotine polacrilex (NICORETTE) gum 2 mg, 2 mg, Oral, Q4H while awake, Jamelle RushingAnderson, Chelsey L, MD, 2 mg at 09/18/22 1652   ondansetron (ZOFRAN) injection 4 mg, 4 mg, Intravenous, Q6H PRN, Ezequiel EssexNelson, Dana G, NP, 4 mg at 09/18/22 1405   polyethylene glycol (MIRALAX / GLYCOLAX) packet 17 g, 17 g, Oral, Daily PRN, Ouma, Hubbard HartshornElizabeth Achieng, NP   traMADol (ULTRAM) tablet 50 mg, 50 mg, Oral, Q6H PRN, Ezequiel EssexNelson, Dana G, NP, 50 mg at 09/18/22 1107  Vitals   Vitals:   09/18/22 1200 09/18/22 1500 09/18/22  1634 09/18/22 1724  BP: 113/84 116/79  (!) 131/96  Pulse: 88 (!) 43 (!) 57 (!) 57  Resp: 15  13 16   Temp:    98.3 F (36.8 C)  TempSrc:    Oral  SpO2: 100% 98% 100% 100%  Weight:      Height:         Body mass index is 26.3 kg/m.  Physical Exam   Physical Exam Gen: A&O x4, NAD HEENT: Atraumatic, normocephalic;mucous membranes moist; oropharynx clear, tongue without atrophy or fasciculations. Neck: Supple,  trachea midline. Resp: CTAB, no w/r/r CV: RRR, no m/g/r; nml S1 and S2. 2+ symmetric peripheral pulses. Abd: soft/NT/ND; nabs x 4 quad Extrem: Nml bulk; no cyanosis, clubbing, or edema.  Neuro: *MS: A&O x4. Follows multi-step commands.  *Speech: fluid, nondysarthric, able to name and repeat *CN:    I: Deferred   II,III: PERRLA, VFF by confrontation, optic discs unable to be visualized 2/2 pupillary constriction   III,IV,VI: EOMI w/o nystagmus, no ptosis   V: Sensation intact from V1 to V3 to LT   VII: Eyelid closure was full.  Smile symmetric.   VIII: Hearing intact to voice   IX,X: Voice normal, palate elevates symmetrically    XI: SCM/trap 5/5 bilat   XII: Tongue protrudes midline, no atrophy or fasciculations   *Motor:   Normal bulk.  No tremor, rigidity or bradykinesia. No pronator drift.    Strength: Dlt Bic Tri WrE WrF FgS Gr HF KnF KnE PlF DoF    Left 5 5 5 5 5 5 5 5 5 5 5 5     Right 5 5 5 5 5 5 5 5 5 5 5 5     *Sensory: Intact to light touch, pinprick, temperature vibration throughout. Symmetric. Propioception intact bilat.  No double-simultaneous extinction.  *Coordination:  Finger-to-nose, heel-to-shin, rapid alternating motions were intact. *Reflexes:  2+ and symmetric throughout without clonus; toes down-going bilat *Gait: deferred   Labs   CBC:  Recent Labs  Lab 09/16/22 2304 09/17/22 0516 09/18/22 0445  WBC 8.1 12.8* 8.5  NEUTROABS 4.1  --  6.5  HGB 13.5 13.1 12.0  HCT 41.6 40.7 35.9*  MCV 91.2 91.7 90.7  PLT 230 192 178    Basic  Metabolic Panel:  Lab Results  Component Value Date   NA 138 09/18/2022   K 4.0 09/18/2022   CO2 22 09/18/2022   GLUCOSE 96 09/18/2022   BUN 7 09/18/2022   CREATININE 0.67 09/18/2022   CALCIUM 8.5 (L) 09/18/2022   GFRNONAA >60 09/18/2022   GFRAA >60 03/28/2019   Lipid Panel: No results found for: "LDLCALC" HgbA1c: No results found for: "HGBA1C" Urine Drug Screen:     Component Value Date/Time   LABOPIA NONE DETECTED 09/17/2022 0100   COCAINSCRNUR NONE DETECTED 09/17/2022 0100   LABBENZ NONE DETECTED 09/17/2022 0100   AMPHETMU NONE DETECTED 09/17/2022 0100   THCU POSITIVE (A) 09/17/2022 0100   LABBARB NONE DETECTED 09/17/2022 0100    Alcohol Level     Component Value Date/Time   ETH <10 09/16/2022 2304     Impression   26 yo woman with hx seizures since age of 46, noncompliant for yrs, not currently on AEDs 2/2 self-discontinuation, who presented after GTC seizure at home with head trauma and had 3 subsequent GTCs in ED after arrival. Initial head CT was interpreted as negative. MRI brain subsequently showed small focus of hyperintense T2-weighted signal along the lateral anterior right convexity, that appears to occupy the subarachnoid space representing small traumatic SAH or vascular malformation. I examined patient this afternoon prior to her getting the MRA head. I told her that we were going to do some additional imaging to clarify a potential abnormality on the MRI brain. After the MRA returned negative, I re-reviewed her head CT and see a very small focus of SAH vs small parenchymal contusion that was missed on initial head CT. Etiology of this finding is traumatic and is not concerning for aneurysmal SAH. Neurology will come talk to patient again in the morning  to discuss these imaging findings with her. I hope this will further strengthen her decision to be compliant with AEDs going forward. I already had a very extensive conversation with her today about the importance of  staying on her seizure medication and all counseled her no driving until she is compliant with her meds and seizure-free for at least 6 mos. She has agreed to stay on keppra for now and should be discharged on current dose.  Recommendations   - Neurology to discuss imaging findings with patient tmrw AM - If patient is not discharged tmrw and remains stable, OK to start pharm DVT prophylaxis - Please discharge patient on keppra 500mg  bid 90 with refills to last at least 3 mos - I will arrange for outpatient neuro f/u - No indication for EEG, patient has known epilepsy ______________________________________________________________________   Thank you for the opportunity to take part in the care of this patient. If you have any further questions, please contact the neurology consultation attending.  Signed,  , MD [facility] [phone]  If 7pm- 7am, please page neurology on call as listed in AMION.  **Any copied and pasted documentation in this note was written by me in another application not billed for and pasted by me into this document.

## 2022-09-19 ENCOUNTER — Other Ambulatory Visit: Payer: Self-pay

## 2022-09-19 ENCOUNTER — Inpatient Hospital Stay: Payer: Medicaid Other | Attending: Cardiology

## 2022-09-19 DIAGNOSIS — I609 Nontraumatic subarachnoid hemorrhage, unspecified: Secondary | ICD-10-CM

## 2022-09-19 DIAGNOSIS — G40909 Epilepsy, unspecified, not intractable, without status epilepticus: Secondary | ICD-10-CM

## 2022-09-19 DIAGNOSIS — R001 Bradycardia, unspecified: Secondary | ICD-10-CM

## 2022-09-19 LAB — CBC WITH DIFFERENTIAL/PLATELET
Abs Immature Granulocytes: 0.02 10*3/uL (ref 0.00–0.07)
Basophils Absolute: 0 10*3/uL (ref 0.0–0.1)
Basophils Relative: 1 %
Eosinophils Absolute: 0.1 10*3/uL (ref 0.0–0.5)
Eosinophils Relative: 2 %
HCT: 35.2 % — ABNORMAL LOW (ref 36.0–46.0)
Hemoglobin: 11.9 g/dL — ABNORMAL LOW (ref 12.0–15.0)
Immature Granulocytes: 0 %
Lymphocytes Relative: 35 %
Lymphs Abs: 2 10*3/uL (ref 0.7–4.0)
MCH: 30.1 pg (ref 26.0–34.0)
MCHC: 33.8 g/dL (ref 30.0–36.0)
MCV: 89.1 fL (ref 80.0–100.0)
Monocytes Absolute: 0.3 10*3/uL (ref 0.1–1.0)
Monocytes Relative: 5 %
Neutro Abs: 3.1 10*3/uL (ref 1.7–7.7)
Neutrophils Relative %: 57 %
Platelets: 164 10*3/uL (ref 150–400)
RBC: 3.95 MIL/uL (ref 3.87–5.11)
RDW: 12.2 % (ref 11.5–15.5)
WBC: 5.5 10*3/uL (ref 4.0–10.5)
nRBC: 0 % (ref 0.0–0.2)

## 2022-09-19 LAB — BASIC METABOLIC PANEL
Anion gap: 5 (ref 5–15)
BUN: 9 mg/dL (ref 6–20)
CO2: 22 mmol/L (ref 22–32)
Calcium: 8.4 mg/dL — ABNORMAL LOW (ref 8.9–10.3)
Chloride: 109 mmol/L (ref 98–111)
Creatinine, Ser: 0.69 mg/dL (ref 0.44–1.00)
GFR, Estimated: >60 mL/min (ref >60)
Glucose, Bld: 85 mg/dL (ref 70–99)
Potassium: 3.9 mmol/L (ref 3.5–5.1)
Sodium: 136 mmol/L (ref 135–145)

## 2022-09-19 MED ORDER — LEVETIRACETAM 500 MG PO TABS: 500.0000 mg | ORAL_TABLET | Freq: Two times a day (BID) | ORAL | 0 refills | Status: DC

## 2022-09-19 MED ORDER — LEVETIRACETAM 500 MG PO TABS: 500.0000 mg | ORAL_TABLET | Freq: Two times a day (BID) | ORAL | Status: DC

## 2022-09-19 MED ORDER — IBUPROFEN 600 MG PO TABS: 600.0000 mg | ORAL_TABLET | Freq: Three times a day (TID) | ORAL | 0 refills | Status: DC | PRN

## 2022-09-19 MED ORDER — DIAZEPAM 2.5 MG RE GEL: 2.5000 mg | Freq: Once | RECTAL | 0 refills | Status: AC

## 2022-09-19 NOTE — Procedures (Signed)
Patient Name: Heather [name]  MRN: [id number]  Epilepsy Attending: Charlsie Quest  Referring Physician/Provider: Jimmye Norman, NP  Date: 09/19/2022  Duration: 41.05 mins  Patient history: 26 year old female with a history of epilepsy, noncompliant with anticonvulsant therapy for years, who presented after seizure recurrence.  EEG to evaluate for seizure.  Level of alertness: Awake, asleep  AEDs during EEG study: None  Technical aspects: This EEG study was done with scalp electrodes positioned according to the 10-20 International system of electrode placement. Electrical activity was reviewed with band pass filter of 1-70Hz , sensitivity of 7 uV/mm, display speed of 68mm/sec with a 60Hz  notched filter applied as appropriate. EEG data were recorded continuously and digitally stored.  Video monitoring was available and reviewed as appropriate.  Description: The posterior dominant rhythm consists of 9 Hz activity of moderate voltage (25-35 uV) seen predominantly in posterior head regions, symmetric and reactive to eye opening and eye closing. Sleep was characterized by vertex waves, sleep spindles (12 to 14 Hz), maximal frontocentral region. Physiologic photic driving was not seen during photic stimulation. Hyperventilation was not performed.     IMPRESSION: This study is within normal limits. No seizures or epileptiform discharges were seen throughout the recording.  A normal interictal EEG does not exclude  the diagnosis of epilepsy.  [name] 

## 2022-09-19 NOTE — Progress Notes (Signed)
Subjective: Awake, watching TV in bed.   Objective: Current vital signs: BP (!) 149/84 (BP Location: Left Arm)   Pulse (!) 55   Temp 98.9 F (37.2 C) (Oral)   Resp 16   Ht 5\' 6"  (1.676 m)   Wt 73.9 kg   SpO2 100%   BMI 26.30 kg/m  Vital signs in last 24 hours: Temp:  [97.9 F (36.6 C)-98.9 F (37.2 C)] 98.9 F (37.2 C) (11/13 0755) Pulse Rate:  [43-88] 55 (11/13 0755) Resp:  [13-18] 16 (11/13 0755) BP: (113-149)/(79-96) 149/84 (11/13 0755) SpO2:  [98 %-100 %] 100 % (11/13 0755)  Intake/Output from previous day: 11/12 0701 - 11/13 0700 In: 2960.6 [I.V.:2660.6; IV Piggyback:300] Out: 200 [Urine:200] Intake/Output this shift: No intake/output data recorded. Nutritional status:  Diet Order             Diet Carb Modified Fluid consistency: Thin; Room service appropriate? Yes  Diet effective now                  HEENT: Small focus of swelling at posterior left scalp due to recent fall.  Lungs: Respirations unlabored  Neurologic Exam: Ment: Awake, alert and oriented. Speech is normal. Has poor memory of the events surrounding her seizure.  CN: EOMI. Face symmetric. Phonation intact.  Motor: No deficits noted.  Cerebellar: No gross ataxia with spontaneous movements.   Lab Results: Results for orders placed or performed during the hospital encounter of 09/16/22 (from the past 48 hour(s))  Basic metabolic panel     Status: Abnormal   Collection Time: 09/18/22  4:45 AM  Result Value Ref Range   Sodium 138 135 - 145 mmol/L    Comment: ELECTROLYTES REPEATED TO VERIFY DLB   Potassium 4.0 3.5 - 5.1 mmol/L   Chloride 113 (H) 98 - 111 mmol/L   CO2 22 22 - 32 mmol/L   Glucose, Bld 96 70 - 99 mg/dL    Comment: Glucose reference range applies only to samples taken after fasting for at least 8 hours.   BUN 7 6 - 20 mg/dL   Creatinine, Ser 1.610.67 0.44 - 1.00 mg/dL   Calcium 8.5 (L) 8.9 - 10.3 mg/dL   GFR, Estimated >09>60 >60>60 mL/min    Comment: (NOTE) Calculated using the  CKD-EPI Creatinine Equation (2021)    Anion gap 3 (L) 5 - 15    Comment: Performed at [facility], [address] [place], [place] [id number]  CBC with Differential/Platelet     Status: Abnormal   Collection Time: 09/18/22  4:45 AM  Result Value Ref Range   WBC 8.5 4.0 - 10.5 K/uL   RBC 3.96 3.87 - 5.11 MIL/uL   Hemoglobin 12.0 12.0 - 15.0 g/dL   HCT 81.135.9 (L) 91.436.0 - 78.246.0 %   MCV 90.7 80.0 - 100.0 fL   MCH 30.3 26.0 - 34.0 pg   MCHC 33.4 30.0 - 36.0 g/dL   RDW 95.612.5 21.311.5 - 08.615.5 %   Platelets 178 150 - 400 K/uL   nRBC 0.0 0.0 - 0.2 %   Neutrophils Relative % 77 %   Neutro Abs 6.5 1.7 - 7.7 K/uL   Lymphocytes Relative 18 %   Lymphs Abs 1.5 0.7 - 4.0 K/uL   Monocytes Relative 4 %   Monocytes Absolute 0.3 0.1 - 1.0 K/uL   Eosinophils Relative 0 %   Eosinophils Absolute 0.0 0.0 - 0.5 K/uL   Basophils Relative 0 %   Basophils Absolute 0.0 0.0 -  0.1 K/uL   Immature Granulocytes 1 %   Abs Immature Granulocytes 0.04 0.00 - 0.07 K/uL    Comment: Performed at [facility], [address] [place], [place] [address]  Magnesium     Status: None   Collection Time: 09/18/22  4:45 AM  Result Value Ref Range   Magnesium 2.0 1.7 - 2.4 mg/dL    Comment: Performed at [facility], [address] [place], [place] [address]  Phosphorus     Status: None   Collection Time: 09/18/22  4:45 AM  Result Value Ref Range   Phosphorus 3.1 2.5 - 4.6 mg/dL    Comment: Performed at [facility], [address] [place], [place] [address]  Basic metabolic panel     Status: Abnormal   Collection Time: 09/19/22  4:55 AM  Result Value Ref Range   Sodium 136 135 - 145 mmol/L   Potassium 3.9 3.5 - 5.1 mmol/L   Chloride 109 98 - 111 mmol/L   CO2 22 22 - 32 mmol/L   Glucose, Bld 85 70 - 99 mg/dL    Comment: Glucose reference range applies only to samples taken after fasting for at least 8 hours.   BUN 9 6 - 20 mg/dL   Creatinine, Ser 2.67 0.44 - 1.00 mg/dL   Calcium  8.4 (L) 8.9 - 10.3 mg/dL   GFR, Estimated >12 >45 mL/min    Comment: (NOTE) Calculated using the CKD-EPI Creatinine Equation (2021)    Anion gap 5 5 - 15    Comment: Performed at [facility], [address] [place], [place] [address]  CBC with Differential/Platelet     Status: Abnormal   Collection Time: 09/19/22  4:55 AM  Result Value Ref Range   WBC 5.5 4.0 - 10.5 K/uL   RBC 3.95 3.87 - 5.11 MIL/uL   Hemoglobin 11.9 (L) 12.0 - 15.0 g/dL   HCT 33.8 (L) 25.0 - 53.9 %   MCV 89.1 80.0 - 100.0 fL   MCH 30.1 26.0 - 34.0 pg   MCHC 33.8 30.0 - 36.0 g/dL   RDW 76.7 34.1 - 93.7 %   Platelets 164 150 - 400 K/uL   nRBC 0.0 0.0 - 0.2 %   Neutrophils Relative % 57 %   Neutro Abs 3.1 1.7 - 7.7 K/uL   Lymphocytes Relative 35 %   Lymphs Abs 2.0 0.7 - 4.0 K/uL   Monocytes Relative 5 %   Monocytes Absolute 0.3 0.1 - 1.0 K/uL   Eosinophils Relative 2 %   Eosinophils Absolute 0.1 0.0 - 0.5 K/uL   Basophils Relative 1 %   Basophils Absolute 0.0 0.0 - 0.1 K/uL   Immature Granulocytes 0 %   Abs Immature Granulocytes 0.02 0.00 - 0.07 K/uL    Comment: Performed at [facility], [address] [place], [place] [address]    Recent Results (from the past 240 hour(s))  MRSA Next Gen by PCR, Nasal     Status: None   Collection Time: 09/17/22  8:13 AM   Specimen: Nasal Mucosa; Nasal Swab  Result Value Ref Range Status   MRSA by PCR Next Gen NOT DETECTED NOT DETECTED Final    Comment: (NOTE) The GeneXpert MRSA Assay (FDA approved for NASAL specimens only), is one component of a comprehensive MRSA colonization surveillance program. It is not intended to diagnose MRSA infection nor to guide or monitor treatment for MRSA infections. Test performance is not FDA approved in patients less than 2  years old. Performed at [facility], [address] [place], [place] [address]     Lipid Panel No results for input(s): "CHOL", "TRIG", "HDL", "CHOLHDL", "VLDL", "LDLCALC"  in the last 72 hours.  Studies/Results: MR ANGIO HEAD WO CONTRAST  Result Date: 09/18/2022 CLINICAL DATA:  26 year old female with seizures. Suspicion of trace intracranial hemorrhage on MRI yesterday. EXAM: MRA HEAD WITHOUT CONTRAST TECHNIQUE: Angiographic images of the [place] were acquired using MRA technique without intravenous contrast. COMPARISON:  Brain MRI 09/17/2022 and head CT earlier the same day. FINDINGS: Anterior circulation: Antegrade flow in both ICA siphons with no evidence of siphon stenosis. Ophthalmic and posterior communicating artery origins appear normal. Patent carotid termini. MCA and ACA origins appear normal. Left ACA is mildly dominant throughout. Diminutive or absent anterior communicating artery. Visible ACA branches are within normal limits. Left MCA M1 segment and trifurcation are patent without stenosis. Left MCA branches are within normal limits. Right MCA M1 segment and bifurcation are patent without stenosis. Right MCA branches are within normal limits. Posterior circulation: Antegrade flow in the posterior circulation with fairly codominant distal vertebral arteries. PICA origins and vertebrobasilar junction appear patent without stenosis. Patent basilar artery without evidence of stenosis. Patent SCA and PCA origins. Fetal type right PCA with smaller left posterior communicating artery. Bilateral PCA branches are within normal limits. Anatomic variants: Fetal type right PCA origin. Mildly dominant left ACA. Other: Right middle frontal gyrus area of abnormal MRI signal is subtle by MRA on series 5, image 171. No regional abnormal vascularity or flow signal. Small regional peripheral arteries and veins appear within normal limits. No intracranial mass effect or ventriculomegaly. IMPRESSION: Negative intracranial MRA; with no MRA abnormality corresponding to the cortical abnormality in the periphery of the right middle frontal gyrus. Electronically Signed   By: Odessa Fleming M.D.   On: 09/18/2022 09:41   MR CERVICAL SPINE WO CONTRAST  Result Date: 09/17/2022 CLINICAL DATA:  Acute neck pain.  Recent fall with seizure EXAM: MRI CERVICAL SPINE WITHOUT CONTRAST TECHNIQUE: Multiplanar, multisequence MR imaging of the cervical spine was performed. No intravenous contrast was administered. COMPARISON:  None Available. FINDINGS: Alignment: Physiologic. Vertebrae: No fracture, evidence of discitis, or bone lesion. Cord: Normal signal and morphology. Posterior Fossa, vertebral arteries, paraspinal tissues: Negative. Disc levels: C1-2: Unremarkable. C2-3: Normal disc space and facet joints. There is no spinal canal stenosis. No neural foraminal stenosis. C3-4: Normal disc space and facet joints. There is no spinal canal stenosis. No neural foraminal stenosis. C4-5: Normal disc space and facet joints. There is no spinal canal stenosis. No neural foraminal stenosis. C5-6: Normal disc space and facet joints. There is no spinal canal stenosis. No neural foraminal stenosis. C6-7: Normal disc space and facet joints. There is no spinal canal stenosis. No neural foraminal stenosis. C7-T1: Normal disc space and facet joints. There is no spinal canal stenosis. No neural foraminal stenosis. IMPRESSION: Normal MRI of the cervical spine. Electronically Signed   By: Deatra Robinson M.D.   On: 09/17/2022 20:25   MR BRAIN W WO CONTRAST  Result Date: 09/17/2022 CLINICAL DATA:  Seizure disorder EXAM: MRI HEAD WITHOUT AND WITH CONTRAST TECHNIQUE: Multiplanar, multiecho pulse sequences of the brain and surrounding structures were obtained without and with intravenous contrast. CONTRAST:  7.50mL GADAVIST GADOBUTROL 1 MMOL/ML IV SOLN COMPARISON:  Head CT 09/17/2022 FINDINGS: Brain: There is a small focus of hyperintense T2-weighted signal along the lateral anterior right convexity, that appears to occupy the subarachnoid space.  There is no associated contrast enhancement or correlate on the earlier head CT.  Normal white matter signal, parenchymal volume and CSF spaces. The midline structures are normal. Vascular: Major flow voids are preserved. Skull and upper cervical spine: Normal calvarium and skull base. Visualized upper cervical spine and soft tissues are normal. Sinuses/Orbits:No paranasal sinus fluid levels or advanced mucosal thickening. No mastoid or middle ear effusion. Normal orbits. IMPRESSION: 1. Small focus of hyperintense T2-weighted signal along the lateral anterior right convexity, that appears to occupy the subarachnoid space. This is favored to be a small focus of subarachnoid blood; however, a vascular lesion such is a cavernous malformation or an arteriovenous malformation are other possibilities. MRA of the head might be helpful. 2. Otherwise normal brain MRI. Electronically Signed   By: Deatra Robinson M.D.   On: 09/17/2022 20:09    Medications: Scheduled:  nicotine polacrilex  2 mg Oral Q4H while awake   Continuous:  sodium chloride 75 mL/hr (09/19/22 6010)   levETIRAcetam Stopped (09/18/22 2303)    Assessment: 26 year old female with a history of epilepsy, noncompliant with anticonvulsant therapy for years, who presented after seizure recurrence. First seizure was at home resulting in a fall in which she struck the back of her head on the left. She had 3 subsequent GTCs in the ED after arrival. Initial head CT was interpreted as negative. MRI brain subsequently showed small focus of hyperintense T2-weighted signal along the antero-lateral right frontal convexity, that appears to occupy the subarachnoid space, with corresponding hypointensity on GRE, appearing most consistent with a small brain contusion with associated subarachnoid blood. Most likely mechanism is contrecoup injury secondary to the fall. Of note, the fall per history given by family was due to the first breakthrough seizure occurring at home.   Recommendations: - Discussed with patient and family (family via  telephone) that she has had a small cortical contusion secondary to the fall. This further increases her risk of subsequent seizure.  - Discussed the need for compliance with Keppra. Currently being given IV at 500 mg BID. Change to PO at the same dose prior to discharge.  - Follow up with Dr. Sherryll Burger at the [facility] within 4 weeks. Will need repeat MRI in 1-3 months to assess for expected interval resolution of the hemorrhage.  - Discussed outpatient seizure precautions with patient and family. Per [facility] statutes, patients with seizures are not allowed to drive until  they have been seizure-free for six months. Use caution when using heavy equipment or power tools. Avoid working on ladders or at heights. Take showers instead of baths. Ensure the water temperature is not too high on the home water heater. Do not go swimming alone. When caring for infants or small children, sit down when holding, feeding, or changing them to minimize risk of injury to the child in the event you have a seizure. Also, Maintain good sleep hygiene. Avoid alcohol.    LOS: 2 days   @Electronically  signed: Dr. 09/19/2022  8:52 AM

## 2022-09-19 NOTE — Progress Notes (Signed)
Eeg done 

## 2022-09-19 NOTE — Consult Note (Signed)
Cardiology Consultation   Patient ID: Tekisha Granato MRN: [id number]; DOB: 27-Dec-1995  Admit date: 09/16/2022 Date of Consult: 09/19/2022  PCP:  [facility] [facility] Cardiologist:  None      New consult done by Dr.Arida  Patient Profile:   Aribel Chaussee is a 26 y.o. female with a hx of seizures previously on Tegretol, polysubstance abuse, migraine headaches, and appendicitis status post appendectomy, who is being seen 09/19/2022 for the evaluation of asymptomatic bradycardia at the request of Dr. Ouida Sills.  History of Present Illness:   Ms. Abdo is a 26 year old female with history of seizures previously on Tegretol and subsequently weaned herself off approximately 2018, polysubstance abuse, migraine headaches that had appendicitis status post appendectomy.  According to her record her mother witnessed syncopal episode with complete loss of consciousness that lasted approximately 1 minute.  Mother stated the patient was complaining of being tired and when she stood to get to the bathroom she collapsed on the floor was having what appeared to be a generalized tonic-clonic seizure.  Mother immediately called 911 and on arrival of EMS patient was found sitting upright on the couch.  She was alert and oriented in no acute distress.  Respiratory rate/effort were even and unlabored even though she was found to be diaphoretic.  Patient had known history of seizures with last seizure documented 2021.  In the emergency department the emergency department nurse reports they were talking the patient was essentially midsentence when her eyes deviated to the upper right and she stopped speaking.  In a short period of time after that the patient grimaced and started having generalized tonic-clonic seizure-like activity.  Oxygen saturations dropped into the 70s and she began to froth at the mouth.  Initial vital signs in the emergency  department: Blood pressure 141/93, pulse of 96, respirations 18, temperature 98.1  Pertinent labs: Potassium 2.8, glucose 101, ALT 45, urine drug screen positive for cannabinoid, lactic acid greater than 9, repeat lactic acid 2  Imaging: CT of the head revealed no acute intracranial process, no acute fracture or traumatic listhesis in the cervical spine,CT of the cervical spine revealed no acute intracranial process, chest x-ray revealed no active disease, MRI of the brain and cervical spine revealed small focus of hyperintense T2-weighted signal along the lateral anterior right convexity, that appears to occupy the subarachnoid space.  This is favored to be a small focus of subarachnoid blood; however a vascular lesion such as a cavernous malformation or an AV malformation or other possibilities, MRA of the head might be helpful otherwise normal brain MRI  Medications administered in the emergency department: She received Ativan, potassium chloride, Keppra, and bolused with normal saline x2 L  Cardiology was consulted for sinus bradycardia on telemetry of the patient was asymptomatic with heart rates in the 50s of unknown cause  Past Medical History:  Diagnosis Date   Seizures ([place])    last seizure November 2019    Past Surgical History:  Procedure Laterality Date   APPENDECTOMY  07/11/2021     Home Medications:  Prior to Admission medications   Medication Sig Start Date End Date Taking? Authorizing Provider  acetaminophen (TYLENOL) 325 MG tablet Take 2 tablets (650 mg total) by mouth every 4 (four) hours as needed (for pain scale < 4). 12/08/20   Orlie Pollen, CNM  ibuprofen (ADVIL) 600 MG tablet Take 1 tablet (600 mg total) by mouth every 6 (six) hours. 12/08/20  Orlie Pollen, CNM  norethindrone (MICRONOR) 0.35 MG tablet Take 1 tablet (0.35 mg total) by mouth daily. Patient not taking: Reported on 10/14/2021 05/05/21   Herbie Saxon, CNM  Prenatal Vit-Fe Fumarate-FA (PRENATAL  VITAMIN) 27-0.8 MG TABS Take 1 tablet by mouth daily. Patient not taking: Reported on 05/05/2021 04/22/20   Caren Macadam, MD    Inpatient Medications: Scheduled Meds:  nicotine polacrilex  2 mg Oral Q4H while awake   Continuous Infusions:  sodium chloride 75 mL/hr (09/19/22 1013)   levETIRAcetam 500 mg (09/19/22 1017)   PRN Meds: acetaminophen, LORazepam, LORazepam, LORazepam, ondansetron (ZOFRAN) IV, polyethylene glycol, traMADol  Allergies:   No Known Allergies  Social History:   Social History   Socioeconomic History   Marital status: Single    Spouse name: Not on file   Number of children: 1   Years of education: 14   Highest education level: High school graduate  Occupational History   Occupation: RETAIL Scientist, 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  Tobacco Use   Smoking status: Every Day    Packs/day: 0.14    Types: Cigarettes   Smokeless tobacco: Never  Vaping Use   Vaping Use: Never used  Substance and Sexual Activity   Alcohol use: Not Currently    Alcohol/week: 3.0 standard drinks of alcohol    Types: 3 Standard drinks or equivalent per week    Comment: last use 01/22/22 (2 bottles Petrone) qo weekend   Drug use: Yes    Types: Marijuana    Comment: 3x a week   Sexual activity: Yes    Partners: Male    Birth control/protection: None  Other Topics Concern   Not on file  Social History Narrative   Not on file   Social Determinants of Health   Financial Resource Strain: Not on file  Food Insecurity: No Food Insecurity (09/17/2022)   Hunger Vital Sign    Worried About Running Out of Food in the Last Year: Never true    Ran Out of Food in the Last Year: Never true  Transportation Needs: No Transportation Needs (09/17/2022)   PRAPARE - Hydrologist (Medical): No    Lack of Transportation (Non-Medical): No  Physical Activity: Not on file  Stress: Not on file  Social Connections: Not on file  Intimate Partner  Violence: Not At Risk (09/17/2022)   Humiliation, Afraid, Rape, and Kick questionnaire    Fear of Current or Ex-Partner: No    Emotionally Abused: No    Physically Abused: No    Sexually Abused: No    Family History:    Family History  Problem Relation Age of Onset   Bipolar disorder Mother    OCD Mother    Hypertension Father    Hyperlipidemia Father    Cancer Maternal Grandmother        breast   Hypertension Paternal Grandmother    Hyperlipidemia Paternal Grandmother    Cancer Maternal Uncle        breast   COPD Neg Hx    Diabetes Neg Hx    Heart disease Neg Hx    Stroke Neg Hx      ROS:  Please see the history of present illness.  Review of Systems  Constitutional:  Positive for malaise/fatigue.    All other ROS reviewed and negative.     Physical Exam/Data:   Vitals:   09/18/22 1724 09/18/22 1958 09/19/22 0403 09/19/22 [id number]  BP: (!) 131/96 129/88 135/84 (!) 149/84  Pulse: (!) 57 61 60 (!) 55  Resp: 16 18 18 16   Temp: 98.3 F (36.8 C) 98.7 F (37.1 C) 98.1 F (36.7 C) 98.9 F (37.2 C)  TempSrc: Oral Oral Oral Oral  SpO2: 100% 100% 100% 100%  Weight:      Height:        Intake/Output Summary (Last 24 hours) at 09/19/2022 1056 Last data filed at 09/19/2022 1013 Gross per 24 hour  Intake 2134.32 ml  Output 550 ml  Net 1584.32 ml      09/17/2022    8:10 AM 09/16/2022   11:47 PM 08/01/2022    4:44 PM  Last 3 Weights  Weight (lbs) 162 lb 14.7 oz 130 lb 146 lb  Weight (kg) 73.9 kg 58.968 kg 66.225 kg     Body mass index is 26.3 kg/m.  General:  Well nourished, well developed, in no acute distress HEENT: normal Neck: no JVD Vascular: No carotid bruits; Distal pulses 2+ bilaterally Cardiac:  normal S1, S2; RRR; no murmur  Lungs:  clear to auscultation bilaterally, no wheezing, rhonchi or rales  Abd: soft, nontender, no hepatomegaly  Ext: no edema Musculoskeletal:  No deformities, BUE and BLE strength normal and equal Skin: warm and dry  Neuro:   CNs 2-12 intact, no focal abnormalities noted Psych:  Normal affect   EKG:  The EKG was personally reviewed and demonstrates: Sinus arrhythmia with a rate of 61 with RSR prime in V1 and V2 Telemetry:  Telemetry was personally reviewed and demonstrates: Sinus bradycardia with PACs rates of 50-60  Relevant CV Studies:   Laboratory Data:  High Sensitivity Troponin:  No results for input(s): "TROPONINIHS" in the last 720 hours.   Chemistry Recent Labs  Lab 09/16/22 2304 09/17/22 0516 09/17/22 0837 09/18/22 0445 09/19/22 0455  NA 141 140  --  138 136  K 2.8* 5.2* 4.6 4.0 3.9  CL 109 114*  --  113* 109  CO2 25 23  --  22 22  GLUCOSE 101* 118*  --  96 85  BUN 11 8  --  7 9  CREATININE 0.91 0.75  --  0.67 0.69  CALCIUM 9.1 8.3*  --  8.5* 8.4*  MG 2.0 2.0  --  2.0  --   GFRNONAA >60 >60  --  >60 >60  ANIONGAP 7 3*  --  3* 5    Recent Labs  Lab 09/16/22 2304  PROT 7.4  ALBUMIN 4.3  AST 31  ALT 45*  ALKPHOS 73  BILITOT 0.7   Lipids No results for input(s): "CHOL", "TRIG", "HDL", "LABVLDL", "LDLCALC", "CHOLHDL" in the last 168 hours.  Hematology Recent Labs  Lab 09/17/22 0516 09/18/22 0445 09/19/22 0455  WBC 12.8* 8.5 5.5  RBC 4.44 3.96 3.95  HGB 13.1 12.0 11.9*  HCT 40.7 35.9* 35.2*  MCV 91.7 90.7 89.1  MCH 29.5 30.3 30.1  MCHC 32.2 33.4 33.8  RDW 12.7 12.5 12.2  PLT 192 178 164   Thyroid  Recent Labs  Lab 09/17/22 0516  TSH 0.981    BNPNo results for input(s): "BNP", "PROBNP" in the last 168 hours.  DDimer No results for input(s): "DDIMER" in the last 168 hours.   Radiology/Studies:  MR ANGIO HEAD WO CONTRAST  Result Date: 09/18/2022 CLINICAL DATA:  26 year old female with seizures. Suspicion of trace intracranial hemorrhage on MRI yesterday. EXAM: MRA HEAD WITHOUT CONTRAST TECHNIQUE: Angiographic images of the [place] were acquired using  MRA technique without intravenous contrast. COMPARISON:  Brain MRI 09/17/2022 and head CT earlier the same  day. FINDINGS: Anterior circulation: Antegrade flow in both ICA siphons with no evidence of siphon stenosis. Ophthalmic and posterior communicating artery origins appear normal. Patent carotid termini. MCA and ACA origins appear normal. Left ACA is mildly dominant throughout. Diminutive or absent anterior communicating artery. Visible ACA branches are within normal limits. Left MCA M1 segment and trifurcation are patent without stenosis. Left MCA branches are within normal limits. Right MCA M1 segment and bifurcation are patent without stenosis. Right MCA branches are within normal limits. Posterior circulation: Antegrade flow in the posterior circulation with fairly codominant distal vertebral arteries. PICA origins and vertebrobasilar junction appear patent without stenosis. Patent basilar artery without evidence of stenosis. Patent SCA and PCA origins. Fetal type right PCA with smaller left posterior communicating artery. Bilateral PCA branches are within normal limits. Anatomic variants: Fetal type right PCA origin. Mildly dominant left ACA. Other: Right middle frontal gyrus area of abnormal MRI signal is subtle by MRA on series 5, image 171. No regional abnormal vascularity or flow signal. Small regional peripheral arteries and veins appear within normal limits. No intracranial mass effect or ventriculomegaly. IMPRESSION: Negative intracranial MRA; with no MRA abnormality corresponding to the cortical abnormality in the periphery of the right middle frontal gyrus. Electronically Signed   By: Genevie Ann M.D.   On: 09/18/2022 09:41   MR CERVICAL SPINE WO CONTRAST  Result Date: 09/17/2022 CLINICAL DATA:  Acute neck pain.  Recent fall with seizure EXAM: MRI CERVICAL SPINE WITHOUT CONTRAST TECHNIQUE: Multiplanar, multisequence MR imaging of the cervical spine was performed. No intravenous contrast was administered. COMPARISON:  None Available. FINDINGS: Alignment: Physiologic. Vertebrae: No fracture, evidence of  discitis, or bone lesion. Cord: Normal signal and morphology. Posterior Fossa, vertebral arteries, paraspinal tissues: Negative. Disc levels: C1-2: Unremarkable. C2-3: Normal disc space and facet joints. There is no spinal canal stenosis. No neural foraminal stenosis. C3-4: Normal disc space and facet joints. There is no spinal canal stenosis. No neural foraminal stenosis. C4-5: Normal disc space and facet joints. There is no spinal canal stenosis. No neural foraminal stenosis. C5-6: Normal disc space and facet joints. There is no spinal canal stenosis. No neural foraminal stenosis. C6-7: Normal disc space and facet joints. There is no spinal canal stenosis. No neural foraminal stenosis. C7-T1: Normal disc space and facet joints. There is no spinal canal stenosis. No neural foraminal stenosis. IMPRESSION: Normal MRI of the cervical spine. Electronically Signed   By: Ulyses Jarred M.D.   On: 09/17/2022 20:25   MR BRAIN W WO CONTRAST  Result Date: 09/17/2022 CLINICAL DATA:  Seizure disorder EXAM: MRI HEAD WITHOUT AND WITH CONTRAST TECHNIQUE: Multiplanar, multiecho pulse sequences of the brain and surrounding structures were obtained without and with intravenous contrast. CONTRAST:  7.22mL GADAVIST GADOBUTROL 1 MMOL/ML IV SOLN COMPARISON:  Head CT 09/17/2022 FINDINGS: Brain: There is a small focus of hyperintense T2-weighted signal along the lateral anterior right convexity, that appears to occupy the subarachnoid space. There is no associated contrast enhancement or correlate on the earlier head CT. Normal white matter signal, parenchymal volume and CSF spaces. The midline structures are normal. Vascular: Major flow voids are preserved. Skull and upper cervical spine: Normal calvarium and skull base. Visualized upper cervical spine and soft tissues are normal. Sinuses/Orbits:No paranasal sinus fluid levels or advanced mucosal thickening. No mastoid or middle ear effusion. Normal orbits. IMPRESSION: 1. Small focus  of hyperintense T2-weighted  signal along the lateral anterior right convexity, that appears to occupy the subarachnoid space. This is favored to be a small focus of subarachnoid blood; however, a vascular lesion such is a cavernous malformation or an arteriovenous malformation are other possibilities. MRA of the head might be helpful. 2. Otherwise normal brain MRI. Electronically Signed   By: Ulyses Jarred M.D.   On: 09/17/2022 20:09   DG Chest Port 1 View  Result Date: 09/17/2022 CLINICAL DATA:  Questionable sepsis. EXAM: PORTABLE CHEST 1 VIEW COMPARISON:  Chest radiograph dated 07/20/2017. FINDINGS: No focal consolidation, pleural effusion or pneumothorax. The cardiac silhouette there is within normal limits. No acute osseous pathology. IMPRESSION: No active disease. Electronically Signed   By: Anner Crete M.D.   On: 09/17/2022 00:40   CT Head Wo Contrast  Result Date: 09/17/2022 CLINICAL DATA:  Head and neck trauma EXAM: CT HEAD WITHOUT CONTRAST CT CERVICAL SPINE WITHOUT CONTRAST TECHNIQUE: Multidetector CT imaging of the head and cervical spine was performed following the standard protocol without intravenous contrast. Multiplanar CT image reconstructions of the cervical spine were also generated. RADIATION DOSE REDUCTION: This exam was performed according to the departmental dose-optimization program which includes automated exposure control, adjustment of the mA and/or kV according to patient size and/or use of iterative reconstruction technique. COMPARISON:  05/25/2011 CT head FINDINGS: CT HEAD FINDINGS Brain: No evidence of acute infarct, hemorrhage, mass, mass effect, or midline shift. No hydrocephalus or extra-axial fluid collection. Normal pituitary and craniocervical junction. Vascular: No hyperdense vessel. Skull: Normal. Negative for fracture or focal lesion. Sinuses/Orbits: No acute finding. Other: The mastoid air cells are well aerated. CT CERVICAL SPINE FINDINGS Alignment: No  listhesis. Mild reversal of the normal cervical lordosis, which may be positional. Skull base and vertebrae: No acute fracture. No primary bone lesion or focal pathologic process. Soft tissues and spinal canal: No prevertebral fluid or swelling. No visible canal hematoma. Disc levels:  Disc heights are preserved.  No spinal canal stenosis. Upper chest: Negative. Other: None. IMPRESSION: 1. No acute intracranial process. 2. No acute fracture or traumatic listhesis in the cervical spine. Electronically Signed   By: Merilyn Baba M.D.   On: 09/17/2022 00:27   CT Cervical Spine Wo Contrast  Result Date: 09/17/2022 CLINICAL DATA:  Head and neck trauma EXAM: CT HEAD WITHOUT CONTRAST CT CERVICAL SPINE WITHOUT CONTRAST TECHNIQUE: Multidetector CT imaging of the head and cervical spine was performed following the standard protocol without intravenous contrast. Multiplanar CT image reconstructions of the cervical spine were also generated. RADIATION DOSE REDUCTION: This exam was performed according to the departmental dose-optimization program which includes automated exposure control, adjustment of the mA and/or kV according to patient size and/or use of iterative reconstruction technique. COMPARISON:  05/25/2011 CT head FINDINGS: CT HEAD FINDINGS Brain: No evidence of acute infarct, hemorrhage, mass, mass effect, or midline shift. No hydrocephalus or extra-axial fluid collection. Normal pituitary and craniocervical junction. Vascular: No hyperdense vessel. Skull: Normal. Negative for fracture or focal lesion. Sinuses/Orbits: No acute finding. Other: The mastoid air cells are well aerated. CT CERVICAL SPINE FINDINGS Alignment: No listhesis. Mild reversal of the normal cervical lordosis, which may be positional. Skull base and vertebrae: No acute fracture. No primary bone lesion or focal pathologic process. Soft tissues and spinal canal: No prevertebral fluid or swelling. No visible canal hematoma. Disc levels:  Disc  heights are preserved.  No spinal canal stenosis. Upper chest: Negative. Other: None. IMPRESSION: 1. No acute intracranial process. 2. No acute fracture  or traumatic listhesis in the cervical spine. Electronically Signed   By: Wiliam Ke M.D.   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 -last seizures were reported in 2021 until this admission -seizure free the last two days -followed by neurology -continued on keppra -seizure precautions -MRA negative after MRI abnormalities noted  Hypokalemia -potassium on arrival 2.8 -this morning 3.9 -monitor/trend/replete electrolytes as needed -daily bmp  Polysubstance abuse -recommend total cessation -patient was advised previously by nursing staff not to smoke in the room and offered nicotine gum   Risk Assessment/Risk Scores:                For questions or updates, please contact [facility] Please consult [email or web address] for contact info under    Signed, Belma Dyches, NP  09/19/2022 10:56 AM

## 2022-09-19 NOTE — TOC Progression Note (Signed)
Transition of Care [facility]) - Progression Note    Patient Details  Name: Romell Cavanah MRN: [id number] Date of Birth: Oct 24, 1996  Transition of Care [facility] CM/SW Contact  Marlowe Sax, RN Phone Number: 09/19/2022, 11:26 AM  Clinical Narrative:      Transition of Care (TOC) Screening Note   Patient Details  Name: Lexine Jaspers Date of Birth: July 19, 1996   Transition of Care [facility]) CM/SW Contact:    Marlowe Sax, RN Phone Number: 09/19/2022, 11:26 AM    Transition of Care Department [facility]) has reviewed patient and no TOC needs have been identified at this time. We will continue to monitor patient advancement through interdisciplinary progression rounds. If new patient transition needs arise, please place a TOC consult.         Expected Discharge Plan and Services                                                 Social Determinants of Health (SDOH) Interventions    Readmission Risk Interventions     No data to display

## 2022-09-19 NOTE — Discharge Summary (Signed)
Physician Discharge Summary  Patient: Heather [name] IEP:[id number] DOB: 05-26-1996   Code Status: Full Code Admit date: 09/16/2022 Discharge date: September 22, 2022 Disposition: {Plan; Disposition:26390}, {HH services:27085} PCP: [facility]  Recommendations for Outpatient Follow-up:  Follow up with PCP within 1-2 weeks Regarding smoking cessation Follow up with neurology Regarding keppra, brain MRI in 1-3 months Follow up with cardiology Regarding bradycardia, zio patch to be mailed to patient  Discharge Diagnoses:  Principal Problem:   Seizure (HCC) since age 68; last seizure 07/2020; self d/c'd Tegretal 2018 Active Problems:   Status epilepticus (HCC)   Elevated lactic acid level   Sinus bradycardia  Brief Hospital Course Summary: Pt ***  Discharge Condition: {DISCHARGE CONDITION:19696}, improved Recommended discharge diet: {Discharge YSAY:301601093}  Consultations: ***  Procedures/Studies: ***  Discharge Instructions     Ambulatory referral to Neurology   Complete by: As directed       Allergies as of 2022/09/22   No Known Allergies      Medication List     STOP taking these medications    norethindrone 0.35 MG tablet Commonly known as: MICRONOR   Prenatal Vitamin 27-0.8 MG Tabs       TAKE these medications    acetaminophen 325 MG tablet Commonly known as: Tylenol Take 2 tablets (650 mg total) by mouth every 4 (four) hours as needed (for pain scale < 4).   diazepam 2.5 MG Gel Commonly known as: DIASTAT Place 2.5 mg rectally once for 1 dose. As needed to abort seizure lasting more than 5 minutes   ibuprofen 600 MG tablet Commonly known as: ADVIL Take 1 tablet (600 mg total) by mouth every 8 (eight) hours as needed. What changed:  when to take this reasons to take this   levETIRAcetam 500 MG tablet Commonly known as: KEPPRA Take 1 tablet (500 mg total) by mouth 2 (two) times daily.        Subjective   Pt  reports ***  Objective  Blood pressure (!) 149/84, pulse (!) 55, temperature 98.9 F (37.2 C), temperature source Oral, resp. rate 16, height 5\' 6"  (1.676 m), weight 73.9 kg, SpO2 100 %, unknown if currently breastfeeding.   General: Pt is alert, awake, not in acute distress Cardiovascular: RRR, S1/S2 +, no rubs, no gallops Respiratory: CTA bilaterally, no wheezing, no rhonchi Abdominal: Soft, NT, ND, bowel sounds + Extremities: no edema, no cyanosis  The results of significant diagnostics from this hospitalization (including imaging, microbiology, ancillary and laboratory) are listed below for reference.   Imaging studies: EEG adult  Result Date: 09/22/22 09/21/2022, MD     09-22-2022 12:33 PM Patient Name: Heather [name] MRN: [name] Epilepsy Attending: [id number] Referring Physician/Provider: Charlsie Quest, NP Date: 09/22/2022 Duration: 41.05 mins Patient history: 26 year old female with a history of epilepsy, noncompliant with anticonvulsant therapy for years, who presented after seizure recurrence.  EEG to evaluate for seizure. Level of alertness: Awake, asleep AEDs during EEG study: None Technical aspects: This EEG study was done with scalp electrodes positioned according to the 10-20 International system of electrode placement. Electrical activity was reviewed with band pass filter of 1-70Hz , sensitivity of 7 uV/mm, display speed of 49mm/sec with a 60Hz  notched filter applied as appropriate. EEG data were recorded continuously and digitally stored.  Video monitoring was available and reviewed as appropriate. Description: The posterior dominant rhythm consists of 9 Hz activity of moderate voltage (25-35 uV) seen predominantly in posterior head regions, symmetric  and reactive to eye opening and eye closing. Sleep was characterized by vertex waves, sleep spindles (12 to 14 Hz), maximal frontocentral region. Physiologic photic driving was not seen during photic  stimulation. Hyperventilation was not performed.   IMPRESSION: This study is within normal limits. No seizures or epileptiform discharges were seen throughout the recording. A normal interictal EEG does not exclude  the diagnosis of epilepsy. Charlsie Questriyanka O Yadav   MR ANGIO HEAD WO CONTRAST  Result Date: 09/18/2022 CLINICAL DATA:  26 year old female with seizures. Suspicion of trace intracranial hemorrhage on MRI yesterday. EXAM: MRA HEAD WITHOUT CONTRAST TECHNIQUE: Angiographic images of the Circle of [place] were acquired using MRA technique without intravenous contrast. COMPARISON:  Brain MRI 09/17/2022 and head CT earlier the same day. FINDINGS: Anterior circulation: Antegrade flow in both ICA siphons with no evidence of siphon stenosis. Ophthalmic and posterior communicating artery origins appear normal. Patent carotid termini. MCA and ACA origins appear normal. Left ACA is mildly dominant throughout. Diminutive or absent anterior communicating artery. Visible ACA branches are within normal limits. Left MCA M1 segment and trifurcation are patent without stenosis. Left MCA branches are within normal limits. Right MCA M1 segment and bifurcation are patent without stenosis. Right MCA branches are within normal limits. Posterior circulation: Antegrade flow in the posterior circulation with fairly codominant distal vertebral arteries. PICA origins and vertebrobasilar junction appear patent without stenosis. Patent basilar artery without evidence of stenosis. Patent SCA and PCA origins. Fetal type right PCA with smaller left posterior communicating artery. Bilateral PCA branches are within normal limits. Anatomic variants: Fetal type right PCA origin. Mildly dominant left ACA. Other: Right middle frontal gyrus area of abnormal MRI signal is subtle by MRA on series 5, image 171. No regional abnormal vascularity or flow signal. Small regional peripheral arteries and veins appear within normal limits. No intracranial  mass effect or ventriculomegaly. IMPRESSION: Negative intracranial MRA; with no MRA abnormality corresponding to the cortical abnormality in the periphery of the right middle frontal gyrus. Electronically Signed   By: Odessa FlemingH  Hall M.D.   On: 09/18/2022 09:41   MR CERVICAL SPINE WO CONTRAST  Result Date: 09/17/2022 CLINICAL DATA:  Acute neck pain.  Recent fall with seizure EXAM: MRI CERVICAL SPINE WITHOUT CONTRAST TECHNIQUE: Multiplanar, multisequence MR imaging of the cervical spine was performed. No intravenous contrast was administered. COMPARISON:  None Available. FINDINGS: Alignment: Physiologic. Vertebrae: No fracture, evidence of discitis, or bone lesion. Cord: Normal signal and morphology. Posterior Fossa, vertebral arteries, paraspinal tissues: Negative. Disc levels: C1-2: Unremarkable. C2-3: Normal disc space and facet joints. There is no spinal canal stenosis. No neural foraminal stenosis. C3-4: Normal disc space and facet joints. There is no spinal canal stenosis. No neural foraminal stenosis. C4-5: Normal disc space and facet joints. There is no spinal canal stenosis. No neural foraminal stenosis. C5-6: Normal disc space and facet joints. There is no spinal canal stenosis. No neural foraminal stenosis. C6-7: Normal disc space and facet joints. There is no spinal canal stenosis. No neural foraminal stenosis. C7-T1: Normal disc space and facet joints. There is no spinal canal stenosis. No neural foraminal stenosis. IMPRESSION: Normal MRI of the cervical spine. Electronically Signed   By: Deatra RobinsonKevin  Herman M.D.   On: 09/17/2022 20:25   MR BRAIN W WO CONTRAST  Result Date: 09/17/2022 CLINICAL DATA:  Seizure disorder EXAM: MRI HEAD WITHOUT AND WITH CONTRAST TECHNIQUE: Multiplanar, multiecho pulse sequences of the brain and surrounding structures were obtained without and with intravenous contrast. CONTRAST:  7.915mL  GADAVIST GADOBUTROL 1 MMOL/ML IV SOLN COMPARISON:  Head CT 09/17/2022 FINDINGS: Brain: There is  a small focus of hyperintense T2-weighted signal along the lateral anterior right convexity, that appears to occupy the subarachnoid space. There is no associated contrast enhancement or correlate on the earlier head CT. Normal white matter signal, parenchymal volume and CSF spaces. The midline structures are normal. Vascular: Major flow voids are preserved. Skull and upper cervical spine: Normal calvarium and skull base. Visualized upper cervical spine and soft tissues are normal. Sinuses/Orbits:No paranasal sinus fluid levels or advanced mucosal thickening. No mastoid or middle ear effusion. Normal orbits. IMPRESSION: 1. Small focus of hyperintense T2-weighted signal along the lateral anterior right convexity, that appears to occupy the subarachnoid space. This is favored to be a small focus of subarachnoid blood; however, a vascular lesion such is a cavernous malformation or an arteriovenous malformation are other possibilities. MRA of the head might be helpful. 2. Otherwise normal brain MRI. Electronically Signed   By: Deatra Robinson M.D.   On: 09/17/2022 20:09   DG Chest Port 1 View  Result Date: 09/17/2022 CLINICAL DATA:  Questionable sepsis. EXAM: PORTABLE CHEST 1 VIEW COMPARISON:  Chest radiograph dated 07/20/2017. FINDINGS: No focal consolidation, pleural effusion or pneumothorax. The cardiac silhouette there is within normal limits. No acute osseous pathology. IMPRESSION: No active disease. Electronically Signed   By: Elgie Collard M.D.   On: 09/17/2022 00:40   CT Head Wo Contrast  Result Date: 09/17/2022 CLINICAL DATA:  Head and neck trauma EXAM: CT HEAD WITHOUT CONTRAST CT CERVICAL SPINE WITHOUT CONTRAST TECHNIQUE: Multidetector CT imaging of the head and cervical spine was performed following the standard protocol without intravenous contrast. Multiplanar CT image reconstructions of the cervical spine were also generated. RADIATION DOSE REDUCTION: This exam was performed according to the  departmental dose-optimization program which includes automated exposure control, adjustment of the mA and/or kV according to patient size and/or use of iterative reconstruction technique. COMPARISON:  05/25/2011 CT head FINDINGS: CT HEAD FINDINGS Brain: No evidence of acute infarct, hemorrhage, mass, mass effect, or midline shift. No hydrocephalus or extra-axial fluid collection. Normal pituitary and craniocervical junction. Vascular: No hyperdense vessel. Skull: Normal. Negative for fracture or focal lesion. Sinuses/Orbits: No acute finding. Other: The mastoid air cells are well aerated. CT CERVICAL SPINE FINDINGS Alignment: No listhesis. Mild reversal of the normal cervical lordosis, which may be positional. Skull base and vertebrae: No acute fracture. No primary bone lesion or focal pathologic process. Soft tissues and spinal canal: No prevertebral fluid or swelling. No visible canal hematoma. Disc levels:  Disc heights are preserved.  No spinal canal stenosis. Upper chest: Negative. Other: None. IMPRESSION: 1. No acute intracranial process. 2. No acute fracture or traumatic listhesis in the cervical spine. Electronically Signed   By: Wiliam Ke M.D.   On: 09/17/2022 00:27   CT Cervical Spine Wo Contrast  Result Date: 09/17/2022 CLINICAL DATA:  Head and neck trauma EXAM: CT HEAD WITHOUT CONTRAST CT CERVICAL SPINE WITHOUT CONTRAST TECHNIQUE: Multidetector CT imaging of the head and cervical spine was performed following the standard protocol without intravenous contrast. Multiplanar CT image reconstructions of the cervical spine were also generated. RADIATION DOSE REDUCTION: This exam was performed according to the departmental dose-optimization program which includes automated exposure control, adjustment of the mA and/or kV according to patient size and/or use of iterative reconstruction technique. COMPARISON:  05/25/2011 CT head FINDINGS: CT HEAD FINDINGS Brain: No evidence of acute infarct,  hemorrhage, mass, mass effect,  or midline shift. No hydrocephalus or extra-axial fluid collection. Normal pituitary and craniocervical junction. Vascular: No hyperdense vessel. Skull: Normal. Negative for fracture or focal lesion. Sinuses/Orbits: No acute finding. Other: The mastoid air cells are well aerated. CT CERVICAL SPINE FINDINGS Alignment: No listhesis. Mild reversal of the normal cervical lordosis, which may be positional. Skull base and vertebrae: No acute fracture. No primary bone lesion or focal pathologic process. Soft tissues and spinal canal: No prevertebral fluid or swelling. No visible canal hematoma. Disc levels:  Disc heights are preserved.  No spinal canal stenosis. Upper chest: Negative. Other: None. IMPRESSION: 1. No acute intracranial process. 2. No acute fracture or traumatic listhesis in the cervical spine. Electronically Signed   By: Wiliam Ke M.D.   On: 09/17/2022 00:27    Labs: Basic Metabolic Panel: Recent Labs  Lab 09/16/22 2304 09/17/22 0516 09/17/22 0837 09/18/22 0445 09/19/22 0455  NA 141 140  --  138 136  K 2.8* 5.2* 4.6 4.0 3.9  CL 109 114*  --  113* 109  CO2 25 23  --  22 22  GLUCOSE 101* 118*  --  96 85  BUN 11 8  --  7 9  CREATININE 0.91 0.75  --  0.67 0.69  CALCIUM 9.1 8.3*  --  8.5* 8.4*  MG 2.0 2.0  --  2.0  --   PHOS  --  3.2  --  3.1  --    CBC: Recent Labs  Lab 09/16/22 2304 09/17/22 0516 09/18/22 0445 09/19/22 0455  WBC 8.1 12.8* 8.5 5.5  NEUTROABS 4.1  --  6.5 3.1  HGB 13.5 13.1 12.0 11.9*  HCT 41.6 40.7 35.9* 35.2*  MCV 91.2 91.7 90.7 89.1  PLT 230 192 178 164   Microbiology: ***  Time coordinating discharge: Over 30 minutes  Leeroy Bock, MD  [facility] 09/19/2022, 12:35 PM

## 2022-09-19 NOTE — Plan of Care (Signed)

## 2022-09-19 NOTE — Progress Notes (Signed)
[name] to be D/C'd Home per MD order. Discussed prescriptions and follow up appointments with the patient. Prescriptions given to patient, medication list explained in detail. Pt verbalized understanding.  Allergies as of 09/19/2022   No Known Allergies      Medication List     STOP taking these medications    norethindrone 0.35 MG tablet Commonly known as: MICRONOR   Prenatal Vitamin 27-0.8 MG Tabs       TAKE these medications    acetaminophen 325 MG tablet Commonly known as: Tylenol Take 2 tablets (650 mg total) by mouth every 4 (four) hours as needed (for pain scale < 4).   diazepam 2.5 MG Gel Commonly known as: DIASTAT Place 2.5 mg rectally once for 1 dose. As needed to abort seizure lasting more than 5 minutes   ibuprofen 600 MG tablet Commonly known as: ADVIL Take 1 tablet (600 mg total) by mouth every 8 (eight) hours as needed. What changed:  when to take this reasons to take this   levETIRAcetam 500 MG tablet Commonly known as: KEPPRA Take 1 tablet (500 mg total) by mouth 2 (two) times daily.        Vitals:   09/19/22 0403 09/19/22 0755  BP: 135/84 (!) 149/84  Pulse: 60 (!) 55  Resp: 18 16  Temp: 98.1 F (36.7 C) 98.9 F (37.2 C)  SpO2: 100% 100%    Skin clean, dry and intact without evidence of skin break down, no evidence of skin tears noted. IV catheter discontinued intact. Site without signs and symptoms of complications. Dressing and pressure applied. Pt denies pain at this time. No complaints noted.  An After Visit Summary was printed and given to the patient. Patient escorted via WC, and D/C home via private auto.  Madie Reno, RN

## 2022-09-19 NOTE — Discharge Instructions (Addendum)
Continue to take keppra twice daily to prevent seizures.  Follow up with neurology in 4 weeks and discuss your medications and a repeat brain MRI to monitor the bleed. Follow up with cardiology regarding your slow heart beat. They will mail you a monitor to wear at home and will review the results with you

## 2022-09-20 ENCOUNTER — Emergency Department
Admission: EM | Admit: 2022-09-20 | Discharge: 2022-09-20 | Disposition: A | Discharge status: Home / Self Care | Payer: Medicaid Other | Attending: Emergency Medicine | Admitting: Emergency Medicine

## 2022-09-20 ENCOUNTER — Encounter: Payer: Self-pay | Admitting: Emergency Medicine

## 2022-09-20 ENCOUNTER — Other Ambulatory Visit: Payer: Self-pay

## 2022-09-20 ENCOUNTER — Emergency Department: Payer: Medicaid Other

## 2022-09-20 DIAGNOSIS — R519 Headache, unspecified: Secondary | ICD-10-CM | POA: Insufficient documentation

## 2022-09-20 LAB — CBC WITH DIFFERENTIAL/PLATELET
Abs Immature Granulocytes: 0.01 10*3/uL (ref 0.00–0.07)
Basophils Absolute: 0 10*3/uL (ref 0.0–0.1)
Basophils Relative: 1 %
Eosinophils Absolute: 0.1 10*3/uL (ref 0.0–0.5)
Eosinophils Relative: 1 %
HCT: 39.8 % (ref 36.0–46.0)
Hemoglobin: 13.5 g/dL (ref 12.0–15.0)
Immature Granulocytes: 0 %
Lymphocytes Relative: 25 %
Lymphs Abs: 1.5 10*3/uL (ref 0.7–4.0)
MCH: 30.3 pg (ref 26.0–34.0)
MCHC: 33.9 g/dL (ref 30.0–36.0)
MCV: 89.4 fL (ref 80.0–100.0)
Monocytes Absolute: 0.3 10*3/uL (ref 0.1–1.0)
Monocytes Relative: 5 %
Neutro Abs: 4 10*3/uL (ref 1.7–7.7)
Neutrophils Relative %: 68 %
Platelets: 218 10*3/uL (ref 150–400)
RBC: 4.45 MIL/uL (ref 3.87–5.11)
RDW: 12.4 % (ref 11.5–15.5)
WBC: 5.9 10*3/uL (ref 4.0–10.5)
nRBC: 0 % (ref 0.0–0.2)

## 2022-09-20 LAB — COMPREHENSIVE METABOLIC PANEL
ALT: 45 U/L — ABNORMAL HIGH (ref 0–44)
AST: 24 U/L (ref 15–41)
Albumin: 4.1 g/dL (ref 3.5–5.0)
Alkaline Phosphatase: 64 U/L (ref 38–126)
Anion gap: 5 (ref 5–15)
BUN: 12 mg/dL (ref 6–20)
CO2: 26 mmol/L (ref 22–32)
Calcium: 9.1 mg/dL (ref 8.9–10.3)
Chloride: 110 mmol/L (ref 98–111)
Creatinine, Ser: 0.88 mg/dL (ref 0.44–1.00)
GFR, Estimated: >60 mL/min (ref >60)
Glucose, Bld: 90 mg/dL (ref 70–99)
Potassium: 3.9 mmol/L (ref 3.5–5.1)
Sodium: 141 mmol/L (ref 135–145)
Total Bilirubin: 1.1 mg/dL (ref 0.3–1.2)
Total Protein: 7.3 g/dL (ref 6.5–8.1)

## 2022-09-20 MED ORDER — IBUPROFEN 400 MG PO TABS
400.0000 mg | ORAL_TABLET | Freq: Once | ORAL | Status: AC
  Administered 2022-09-20: 400 mg via ORAL
  Filled 2022-09-20: qty 1

## 2022-09-20 NOTE — ED Triage Notes (Signed)
Patient to ED via POV for headache. Patient states she was dc'd yesterday after being in ICU for seizures and a brain bleed. Patient states she has a headache all over and was discharged with the same headache. No relief with any over the counter medication and prescribed medications.

## 2022-09-20 NOTE — Discharge Instructions (Signed)
You can take Tylenol and ibuprofen for your headache as discussed in your discharge papers.  Please rest and avoid screen time while you are recovering.  Please follow-up with neurology as scheduled.

## 2022-09-20 NOTE — ED Provider Notes (Signed)
[facility] Provider Note    Event Date/Time   First MD Initiated Contact with Patient 09/20/22 1723     (approximate)   History   Headache   HPI  Heather [name] is a 26 y.o. female past medical history of seizures who presents with headache.  Patient was recently admitted to [facility] for [facility].  She was found to have a small subarachnoid bleed that was presumed to be traumatic.  She had headaches in the hospital.  She was discharged yesterday.  She says that she has had ongoing headaches and she was not sure what she could take at home so she called the physician helpline and they told her to come to the emergency department.  Headache is the same headache she had in the hospital.  She denies nausea vomiting numbness tingling or weakness.  Has not taken anything at home because she was not sure what she could take.     Past Medical History:  Diagnosis Date   Seizures ([place])    last seizure November 2019    Patient Active Problem List   Diagnosis Date Noted   Subarachnoid bleed ([place]) 09/19/2022   Status epilepticus ([place]) 09/18/2022   Elevated lactic acid level 09/18/2022   Sinus bradycardia 09/18/2022   Smoker 01/25/2022   Obesity BMI=30.0 05/05/2021   Migraines dx'd 2016 05/05/2021   Marijuana user 07/15/2020   Gestational hypertension 04/01/19 04/01/2019   Seizure ([place]) since age 45; last seizure 07/2020; self d/c'd Tegretal 2018 02/28/2018   Herpes 05/24/16 05/24/2016   Acne 12/06/2015   Family history of breast cancer in female 11/30/2015   Eczema 01/14/2014     Physical Exam  Triage Vital Signs: ED Triage Vitals [09/20/22 1641]  Enc Vitals Group     BP (!) 138/90     Pulse Rate 95     Resp 18     Temp 98.4 F (36.9 C)     Temp Source Oral     SpO2 98 %     Weight      Height      Head Circumference      Peak Flow      Pain Score 6     Pain Loc      Pain Edu?      Excl. in [place]?     Most recent vital signs: Vitals:    09/20/22 1641  BP: (!) 138/90  Pulse: 95  Resp: 18  Temp: 98.4 F (36.9 C)  SpO2: 98%     General: Awake, no distress.  CV:  Good peripheral perfusion.  Resp:  Normal effort.  Abd:  No distention.  Neuro:             Awake, Alert, Oriented x 3  Other:  Aox3, nml speech  PERRL, EOMI, face symmetric, nml tongue movement  5/5 strength in the BL upper and lower extremities  Sensation grossly intact in the BL upper and lower extremities  Finger-nose-finger intact BL    ED Results / Procedures / Treatments  Labs (all labs ordered are listed, but only abnormal results are displayed) Labs Reviewed  COMPREHENSIVE METABOLIC PANEL - Abnormal; Notable for the following components:      Result Value   ALT 45 (*)    All other components within normal limits  CBC WITH DIFFERENTIAL/PLATELET  POC URINE PREG, ED     EKG     RADIOLOGY I reviewed the CT head and interpreted it which  shows stable small subarachnoid bleed   PROCEDURES:  Critical Care performed: No  Procedures   MEDICATIONS ORDERED IN ED: Medications  ibuprofen (ADVIL) tablet 400 mg (400 mg Oral Given 09/20/22 1748)     IMPRESSION / MDM / ASSESSMENT AND PLAN / ED COURSE  I reviewed the triage vital signs and the nursing notes.                              Patient's presentation is most consistent with acute presentation with potential threat to life or bodily function.  Differential diagnosis includes, but is not limited to, concussion, post intracranial hemorrhage headache, migraine headache  Patient is a 26 year old female with seizure disorder and recent admission for seizures during which she was found to have a small subarachnoid bleed that was presumed to be due to trauma when she had a seizure presents because of headache.  She has had headaches while she was in the hospital was discharged yesterday but she is not sure which medication she supposed to be taking for headaches or what she is able to  take.  She called the helpline today and is soon as they heard her story they told her to come to the emergency department.  Her headaches are unchanged in quality and character she has no nausea vomiting or neurologic symptoms.  Patient looks quite well her vitals are stable neurologic exam is intact.  CT head was ordered from triage there is no change in the small subarachnoid bleed that was seen.  I suspect that this headache is normal in the setting of recent head trauma with small subarachnoid bleed.  Patient was advised to take Tylenol and ibuprofen on her discharge papers which she did not realize.  I think it is reasonable to continue the NSAID and Tylenol.  Patient also was going to go back to work today but due to likely concussion will give her some time off from work recommended resting and getting adequate hydration and nutrition.       FINAL CLINICAL IMPRESSION(S) / ED DIAGNOSES   Final diagnoses:  Nonintractable headache, unspecified chronicity pattern, unspecified headache type     Rx / DC Orders   ED Discharge Orders     None        Note:  This document was prepared using Dragon voice recognition software and may include unintentional dictation errors.   Georga Hacking, MD 09/20/22 1755

## 2022-10-08 DIAGNOSIS — R001 Bradycardia, unspecified: Secondary | ICD-10-CM

## 2022-10-24 ENCOUNTER — Ambulatory Visit (LOCAL_COMMUNITY_HEALTH_CENTER): Payer: Medicaid Other

## 2022-10-24 VITALS — BP 116/75 | Ht 66.0 in | Wt 153.0 lb

## 2022-10-24 DIAGNOSIS — Z309 Encounter for contraceptive management, unspecified: Secondary | ICD-10-CM

## 2022-10-24 DIAGNOSIS — Z3042 Encounter for surveillance of injectable contraceptive: Secondary | ICD-10-CM | POA: Diagnosis not present

## 2022-10-24 DIAGNOSIS — Z3009 Encounter for other general counseling and advice on contraception: Secondary | ICD-10-CM

## 2022-10-24 NOTE — Progress Notes (Signed)
11 weeks 4 days post depo. Voices no concerns.  Per Onalee Hua, FNP on 08/01/2022, pt needs annual PE for continuation of depo.   Consult A White, FNP today who gives ok to continue depo today and advises RN to send pt to clerk to schedule PE.  Depo given today RUOQ without problem. Next depo due 01/09/23, has reminder. Sent to clerk to schedule PE. Jerel Shepherd, RN

## 2022-11-03 NOTE — Progress Notes (Signed)
She sees you on 11/14/22

## 2022-11-14 ENCOUNTER — Ambulatory Visit: Payer: Medicaid Other | Attending: Medical | Admitting: Medical

## 2022-11-14 ENCOUNTER — Encounter: Payer: Self-pay | Admitting: Medical

## 2022-11-14 VITALS — BP 108/74 | HR 90 | Ht 66.0 in | Wt 154.0 lb

## 2022-11-14 DIAGNOSIS — R0789 Other chest pain: Secondary | ICD-10-CM | POA: Diagnosis not present

## 2022-11-14 DIAGNOSIS — R001 Bradycardia, unspecified: Secondary | ICD-10-CM

## 2022-11-14 DIAGNOSIS — Z720 Tobacco use: Secondary | ICD-10-CM | POA: Diagnosis not present

## 2022-11-14 NOTE — Patient Instructions (Signed)
Medication Instructions:  No changes at this time.   *If you need a refill on your cardiac medications before your next appointment, please call your pharmacy*   Lab Work: None  If you have labs (blood work) drawn today and your tests are completely normal, you will receive your results only by: [place] (if you have MyChart) OR A paper copy in the mail If you have any lab test that is abnormal or we need to change your treatment, we will call you to review the results.   Testing/Procedures: None   Follow-Up: At [facility], you and your health needs are our priority.  As part of our continuing mission to provide you with exceptional heart care, we have created designated Provider Care Teams.  These Care Teams include your primary Cardiologist (physician) and Advanced Practice Providers (APPs -  Physician Assistants and Nurse Practitioners) who all work together to provide you with the care you need, when you need it.    Your next appointment:   As needed    Important Information About Sugar

## 2022-11-14 NOTE — Progress Notes (Signed)
Cardiology Office Note:    Date:  11/14/2022   ID:  [name], DOB Apr 01, 1996, MRN [id number]  PCP:  [facility] [place] Cardiologist:  None  [facility] Electrophysiologist:  None   Referring MD: [facility] [place]   Chief Complaint: Hospital follow-up  History of Present Illness:    Heather [name] is a 27 y.o. female with a hx of seizures, polysubstance abuse, migraine headaches, and appendicitis status post appendectomy who is being seen for hospital follow-up.  Patient has a history of seizures previously on Tegretol and subsequently weaned herself off in 2018.  Patient was admitted in November for witnessed syncopal episode with complete loss of consciousness that lasted about a minute.  She had a seizure neurology was consulted.  This was called who brought her to the ER.  Potassium was 2.8, UDS showed cannabinoids.  Cardiology was consulted for sinus bradycardia.  Heart rate remained in the 50s and 60s. Patient was discharged with a heart monitor.  Heart monitor showed sinus rhythm with an average heart rate of 86, minimum heart rate of 46 bpm, rare PACs. Triggered events did not correlate with arrhythmia or other than occasional sinus tachycardia.  Today, the heart monitor was reviewed. The patient is overall doing well since discharge. She denies further seizures. She is taking Vitamin D. She does have chest pain that has been intermittent for the last 3 years. She says it feels like a strain on the left side of her chest. It mainly occurs at rest. No associated symptoms. She does have some tenderness to palpation on the chest. Patient works from home and is very sedentary. She has two young children at home as well. She is under a lot of stress with her home situation.   Past Medical History:  Diagnosis Date   Seizures ([place])    last seizure November 2019    Past Surgical History:  Procedure Laterality Date    APPENDECTOMY  07/11/2021    Current Medications: Current Meds  Medication Sig   acetaminophen (TYLENOL) 325 MG tablet Take 2 tablets (650 mg total) by mouth every 4 (four) hours as needed (for pain scale < 4).   cyanocobalamin (VITAMIN B12) 1000 MCG tablet Take 2,000 mcg by mouth daily.   ibuprofen (ADVIL) 600 MG tablet Take 1 tablet (600 mg total) by mouth every 8 (eight) hours as needed.   levETIRAcetam (KEPPRA) 500 MG tablet Take 1 tablet (500 mg total) by mouth 2 (two) times daily.   Vitamin D, Ergocalciferol, (DRISDOL) 1.25 MG (50000 UNIT) CAPS capsule Take 50,000 Units by mouth every 7 (seven) days.   Current Facility-Administered Medications for the 11/14/22 encounter (Office Visit) with Kathlen Mody, Marily Konczal H, PA-C  Medication   medroxyPROGESTERone (DEPO-PROVERA) injection 150 mg     Allergies:   Patient has no known allergies.   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  Tobacco Use   Smoking status: Every Day    Packs/day: 0.14    Types: Cigarettes   Smokeless tobacco: Never  Vaping Use   Vaping Use: Never used  Substance and Sexual Activity   Alcohol use: Not Currently    Alcohol/week: 3.0 standard drinks  of alcohol    Types: 3 Standard drinks or equivalent per week    Comment: last use 01/22/22 (2 bottles Petrone) qo weekend   Drug use: Yes    Types: Marijuana    Comment: 3x a week   Sexual activity: Yes    Partners: Male    Birth control/protection: None  Other Topics Concern   Not on file  Social History Narrative   Not on file   Social Determinants of Health   Financial Resource Strain: Not on file  Food Insecurity: No Food Insecurity (09/17/2022)   Hunger Vital Sign    Worried About Running Out of Food in the Last  Year: Never true    Ran Out of Food in the Last Year: Never true  Transportation Needs: No Transportation Needs (09/17/2022)   PRAPARE - Administrator, Civil Service (Medical): No    Lack of Transportation (Non-Medical): No  Physical Activity: Not on file  Stress: Not on file  Social Connections: Not on file     Family History: The patient's family history includes Bipolar disorder in her mother; Cancer in her maternal grandmother and maternal uncle; Hyperlipidemia in her father and paternal grandmother; Hypertension in her father and paternal grandmother; OCD in her mother. There is no history of COPD, Diabetes, Heart disease, or Stroke.  ROS:   Please see the history of present illness.     All other systems reviewed and are negative.  EKGs/Labs/Other Studies Reviewed:    The following studies were reviewed today:  Heart Monitor 10/2022  Patient had a min HR of 46 bpm, max HR of 187 bpm, and avg HR of 86 bpm. Predominant underlying rhythm was Sinus Rhythm.  Rare PACs. Triggered events did not correlate with arrhythmia other than occasional sinus tachycardia.  EKG:  EKG is ordered today.  The ekg ordered today demonstrates NSR, 90bpm, nonspecific ST changes  Recent Labs: 09/17/2022: TSH 0.981 09/18/2022: Magnesium 2.0 09/20/2022: ALT 45; BUN 12; Creatinine, Ser 0.88; Hemoglobin 13.5; Platelets 218; Potassium 3.9; Sodium 141  Recent Lipid Panel No results found for: "CHOL", "TRIG", "HDL", "CHOLHDL", "VLDL", "LDLCALC", "LDLDIRECT"   Physical Exam:    VS:  BP 108/74 (BP Location: Left Arm, Patient Position: Sitting, Cuff Size: Normal)   Pulse 90   Ht 5\' 6"  (1.676 m)   Wt 154 lb (69.9 kg)   SpO2 99%   BMI 24.86 kg/m     Wt Readings from Last 3 Encounters:  11/14/22 154 lb (69.9 kg)  10/24/22 153 lb (69.4 kg)  09/17/22 162 lb 14.7 oz (73.9 kg)     GEN:  Well nourished, well developed in no acute distress HEENT: Normal NECK: No JVD; No carotid  bruits LYMPHATICS: No lymphadenopathy CARDIAC: RRR, no murmurs, rubs, gallops RESPIRATORY:  Clear to auscultation without rales, wheezing or rhonchi  ABDOMEN: Soft, non-tender, non-distended MUSCULOSKELETAL:  No edema; No deformity  SKIN: Warm and dry NEUROLOGIC:  Alert and oriented x 3 PSYCHIATRIC:  Normal affect   ASSESSMENT:    1. Asymptomatic bradycardia   2. Atypical chest pain   3. Tobacco use    PLAN:    In order of problems listed above:  Asymptomatic bradycardia Recent admission for seizure noted to have asymptomatic bradycardia. Recent heart monitor showed NSR with an average heart rate of 86bpm, triggered events associated with NSR and occasional sinus tachycardic. Heart rate today is 90bpm on EKG.  Chest pain The patient reports atypical chest pain. Patient is young, so  low suspicion of CAD. She does smoke. Patient is sedentary at baseline. I recommend lifestyle changes as able, including exercise and healthy diet. We also discussed ways to lower stress.   Tobacco use Cessation encouraged.   Disposition: Follow up prn with MD/APP    Signed, Endre Coutts David Stall, PA-C  11/14/2022 10:20 AM    [facility]

## 2022-11-15 NOTE — Addendum Note (Signed)
Addended by: Janan Ridge on: 11/15/2022 10:51 AM   Modules accepted: Orders

## 2022-12-26 ENCOUNTER — Other Ambulatory Visit: Payer: Self-pay | Admitting: Student in an Organized Health Care Education/Training Program

## 2023-01-13 ENCOUNTER — Ambulatory Visit: Payer: Medicaid Other

## 2023-01-17 ENCOUNTER — Ambulatory Visit (LOCAL_COMMUNITY_HEALTH_CENTER): Payer: Medicaid Other

## 2023-01-17 VITALS — BP 136/88 | Ht 66.0 in | Wt 146.5 lb

## 2023-01-17 DIAGNOSIS — Z30013 Encounter for initial prescription of injectable contraceptive: Secondary | ICD-10-CM | POA: Diagnosis not present

## 2023-01-17 DIAGNOSIS — Z3042 Encounter for surveillance of injectable contraceptive: Secondary | ICD-10-CM

## 2023-01-17 DIAGNOSIS — Z3009 Encounter for other general counseling and advice on contraception: Secondary | ICD-10-CM

## 2023-01-17 DIAGNOSIS — Z309 Encounter for contraceptive management, unspecified: Secondary | ICD-10-CM

## 2023-01-17 NOTE — Progress Notes (Signed)
12 weeks 1 day post depo. Voices no concerns. Has regular f-u with neurologist at [facility] for hx of seizures. Depo given today per order by Collene Leyden, FNP dated 08/01/2022. Tolerated well LUOQ. Next depo due 04/04/23, pt aware. Annual PE scheduled 01/31/2023 at 3:30pm, has appt card. Josie Saunders, RN

## 2023-01-31 ENCOUNTER — Ambulatory Visit: Payer: Medicaid Other

## 2023-04-12 ENCOUNTER — Encounter: Payer: Self-pay | Admitting: Family Medicine

## 2023-04-12 ENCOUNTER — Ambulatory Visit (LOCAL_COMMUNITY_HEALTH_CENTER): Payer: Medicaid Other | Admitting: Family Medicine

## 2023-04-12 VITALS — BP 125/74 | HR 73 | Ht 66.0 in | Wt 163.0 lb

## 2023-04-12 DIAGNOSIS — Z309 Encounter for contraceptive management, unspecified: Secondary | ICD-10-CM | POA: Diagnosis not present

## 2023-04-12 DIAGNOSIS — Z3009 Encounter for other general counseling and advice on contraception: Secondary | ICD-10-CM

## 2023-04-12 DIAGNOSIS — Z113 Encounter for screening for infections with a predominantly sexual mode of transmission: Secondary | ICD-10-CM

## 2023-04-12 DIAGNOSIS — Z30013 Encounter for initial prescription of injectable contraceptive: Secondary | ICD-10-CM

## 2023-04-12 DIAGNOSIS — Z01419 Encounter for gynecological examination (general) (routine) without abnormal findings: Secondary | ICD-10-CM

## 2023-04-12 LAB — HM HIV SCREENING LAB: HM HIV Screening: NEGATIVE

## 2023-04-12 LAB — WET PREP FOR TRICH, YEAST, CLUE
Trichomonas Exam: NEGATIVE
Yeast Exam: NEGATIVE

## 2023-04-12 MED ORDER — MEDROXYPROGESTERONE ACETATE 150 MG/ML IM SUSP
150.0000 mg | INTRAMUSCULAR | Status: DC
Start: 2023-04-12 — End: 2024-04-11
  Administered 2023-04-12: 150 mg via INTRAMUSCULAR

## 2023-04-12 NOTE — Progress Notes (Signed)
[facility] DEPARTMENT [facility] [address] Main Number: [phone]  Family Planning Visit- Repeat Yearly Visit  Subjective:  Heather [name] is a 27 y.o. [id number]  being seen today for an annual wellness visit and to discuss contraception options.   The patient is currently using Hormonal Injection for pregnancy prevention. Patient does not want a pregnancy in the next year.    report they are looking for a method that provides High efficacy at preventing pregnancy   Patient has the following medical problems: has Family history of breast cancer in female; Acne; Seizure (HCC) since age 57; last seizure 07/2020; self d/c'd Tegretal 2018; Gestational hypertension 04/01/19; Herpes 05/24/16; Eczema; Marijuana user; Obesity BMI=30.0; Migraines dx'd 2016; Smoker; Status epilepticus (HCC); Elevated lactic acid level; Sinus bradycardia; and Subarachnoid bleed (HCC) on their problem list.  Chief Complaint  Patient presents with   Annual Exam    Patient reports to clinic for PE and renewal of depo. Being seen by cardiology for continued chest pain x 3 years, and neurology for seizures.   Patient denies other concerns about self   See flowsheet for other program required questions.   Body mass index is 26.31 kg/m. - Patient is eligible for diabetes screening based on BMI> 25 and age >35?  no HA1C ordered? not applicable  Patient reports 1 of partners in last year. Desires STI screening?  Yes   Has patient been screened once for HCV in the past?  No  No results found for: "HCVAB"  Does the patient have current of drug use, have a partner with drug use, and/or has been incarcerated since last result? No  If yes-- Screen for HCV through [facility]   Does the patient meet criteria for HBV testing? No  Criteria:  -Household, sexual or needle sharing contact with HBV -History of drug use -HIV positive -Those with known Hep C   Health  Maintenance Due  Topic Date Due   COVID-19 Vaccine (1) Never done   CHLAMYDIA SCREENING  11/09/2021    Review of Systems  Constitutional:  Negative for weight loss.  Eyes:  Negative for blurred vision.  Respiratory:  Negative for cough and shortness of breath.   Cardiovascular:  Positive for chest pain. Negative for claudication.  Gastrointestinal:  Negative for nausea.  Genitourinary:  Negative for dysuria and frequency.  Skin:  Negative for rash.  Neurological:  Positive for seizures and headaches.  Endo/Heme/Allergies:  Does not bruise/bleed easily.    The following portions of the patient's history were reviewed and updated as appropriate: allergies, current medications, past family history, past medical history, past social history, past surgical history and problem list. Problem list updated.  Objective:   Vitals:   04/12/23 1320  BP: 125/74  Pulse: 73  Weight: 163 lb (73.9 kg)  Height: 5\' 6"  (1.676 m)    Physical Exam Vitals and nursing note reviewed.  Constitutional:      Appearance: Normal appearance.  HENT:     Head: Normocephalic and atraumatic.     Mouth/Throat:     Mouth: Mucous membranes are moist.     Pharynx: Oropharynx is clear. No oropharyngeal exudate or posterior oropharyngeal erythema.  Pulmonary:     Effort: Pulmonary effort is normal.  Abdominal:     General: Abdomen is flat.     Palpations: There is no mass.     Tenderness: There is no abdominal tenderness. There is no rebound.  Genitourinary:  General: Normal vulva.     Exam position: Lithotomy position.     Pubic Area: No rash or pubic lice.      Labia:        Right: No rash or lesion.        Left: No rash or lesion.      Vagina: Vaginal discharge present. No erythema, bleeding or lesions.     Cervix: No cervical motion tenderness, discharge, friability, lesion or erythema.     Uterus: Normal.      Adnexa: Right adnexa normal and left adnexa normal.     Rectum: Normal.     Comments:  pH = 4  Small amt of white discharge present Lymphadenopathy:     Head:     Right side of head: No preauricular or posterior auricular adenopathy.     Left side of head: No preauricular or posterior auricular adenopathy.     Cervical: No cervical adenopathy.     Upper Body:     Right upper body: No supraclavicular, axillary or epitrochlear adenopathy.     Left upper body: No supraclavicular, axillary or epitrochlear adenopathy.     Lower Body: No right inguinal adenopathy. No left inguinal adenopathy.  Skin:    General: Skin is warm and dry.     Findings: No rash.  Neurological:     Mental Status: She is alert and oriented to person, place, and time.       Assessment and Plan:  Heather [name] is a 27 y.o. female [phone] presenting to the [facility] Department for an yearly wellness and contraception visit  1. Well woman exam with routine gynecological exam -CBE not indicated until 27 per ACOG guidelines -pt has a dentist- last cleaning this morning -Pap test last done in 06/2020- NILM, next due in August 2024, counseled to RTC for pap any time after that -hx of seizures- being followed by neurology -hx of chest pain and bradycardia- has cardiologist  2. Family planning Contraception counseling: Reviewed options based on patient desire and reproductive life plan. Patient is interested in Hormonal Injection. This was provided to the patient today.   Risks, benefits, and typical effectiveness rates were reviewed.  Questions were answered.  Written information was also given to the patient to review.    The patient will follow up in  3 months for surveillance.  The patient was told to call with any further questions, or with any concerns about this method of contraception.  Emphasized use of condoms 100% of the time for STI prevention.  Educated on ECP and assessed need for ECP. Not indicated- covered under depo window- last given 01/17/23  3. Screening for  venereal disease  - Chlamydia/Gonorrhea [place] [facility] - HIV [place] [facility] - Syphilis Serology, [place] [facility] - WET PREP FOR TRICH, YEAST, CLUE    No follow-ups on file.  No future appointments.  Lenice Llamas, [place]

## 2023-04-12 NOTE — Progress Notes (Signed)
Wet mount results reviewed, no treatment required per standing order.  Family planning packet given to pt. Depo card given with next injection date of 06/28/2023.

## 2024-02-01 ENCOUNTER — Encounter: Payer: Self-pay | Admitting: Nurse Practitioner

## 2024-02-01 ENCOUNTER — Ambulatory Visit: Admitting: Nurse Practitioner

## 2024-02-01 DIAGNOSIS — B9689 Other specified bacterial agents as the cause of diseases classified elsewhere: Secondary | ICD-10-CM

## 2024-02-01 DIAGNOSIS — Z113 Encounter for screening for infections with a predominantly sexual mode of transmission: Secondary | ICD-10-CM

## 2024-02-01 LAB — WET PREP FOR TRICH, YEAST, CLUE
Trichomonas Exam: NEGATIVE
Yeast Exam: NEGATIVE

## 2024-02-01 LAB — HM HEPATITIS C SCREENING LAB: HM Hepatitis Screen: NEGATIVE

## 2024-02-01 LAB — HM HIV SCREENING LAB: HM HIV Screening: NEGATIVE

## 2024-02-01 LAB — HEPATITIS B SURFACE ANTIGEN

## 2024-02-01 MED ORDER — METRONIDAZOLE 500 MG PO TABS: 500.0000 mg | ORAL_TABLET | Freq: Two times a day (BID) | ORAL | Status: AC

## 2024-02-01 NOTE — Progress Notes (Signed)
 Pt is here for std screening.  Wet prep results reviewed and treated per standing order. The patient was dispensed metronidazole#14 today. I provided counseling today regarding the medication. We discussed the medication, the side effects and when to call clinic. Patient given the opportunity to ask questions. Questions answered.  Gaspar Garbe, RN

## 2024-02-06 NOTE — Progress Notes (Signed)
 [facility] Department STI clinic [address] [place] [address] Main phone: [phone]  STI screening visit  Subjective:  Heather [name] is a 28 y.o. female being seen today for an STI screening visit. The patient reports they do not have symptoms.  Patient reports that they do not desire a pregnancy in the next year.   They reported they are not interested in discussing contraception today.    Patient's last menstrual period was 12/27/2023.  Patient has the following medical conditions:  Patient Active Problem List   Diagnosis Date Noted   Subarachnoid bleed (HCC) 09/19/2022   Status epilepticus (HCC) 09/18/2022   Elevated lactic acid level 09/18/2022   Sinus bradycardia 09/18/2022   Smoker 01/25/2022   Obesity BMI=30.0 05/05/2021   Migraines dx'd 2016 05/05/2021   Marijuana user 07/15/2020   Gestational hypertension 04/01/19 04/01/2019   Seizure (HCC) since age 75; last seizure 07/2020; self d/c'd Tegretal 2018 02/28/2018   Herpes 05/24/16 05/24/2016   Acne 12/06/2015   Family history of breast cancer in female 11/30/2015   Eczema 01/14/2014    Chief Complaint  Patient presents with   SEXUALLY TRANSMITTED DISEASE    Pt is here STD screening and has no symptoms   Patient is a pleasant 28 y.o. female who presents to the office today requesting asymptomatic STI testing. Patient indicates 1 female partner in the last 2 months. She reports practicing vaginal and oral sex and uses condoms sometimes. Patient indicates a history of chlamydia. Patient reports last sex was about 3 months ago. She indicates no use of a contraception method currently, but was previously on Depo and most recent injection was either 3-5 months ago.  Patient indicates LMP was 12/27/23 and has periods monthly.     Last HIV test per patient/review of record was  Lab Results  Component Value Date   HMHIVSCREEN Negative - Validated 04/12/2023    Lab Results   Component Value Date   HIV Non Reactive 09/17/2022     Last HEPC test per patient/review of record was  Lab Results  Component Value Date   HMHEPCSCREEN Negative-Validated 08/01/2022   No components found for: "HEPC"   Last HEPB test per patient/review of record was No components found for: "HMHEPBSCREEN"   Patient reports last pap was:      Component Value Date/Time   DIAGPAP  07/06/2020 1459    - Negative for intraepithelial lesion or malignancy (NILM)   ADEQPAP  07/06/2020 1459    Satisfactory for evaluation; transformation zone component PRESENT.   No results found for: "SPECADGYN" Result Date Procedure Results Follow-ups  07/06/2020 Cytology - PAP Neisseria Gonorrhea: Negative Chlamydia: Negative Trichomonas: Negative Adequacy: Satisfactory for evaluation; transformation zone component PRESENT. Diagnosis: - Negative for intraepithelial lesion or malignancy (NILM) Microorganisms: Fungal organisms present consistent with Candida spp. Comment: Normal Reference Range Trichomonas - Negative Comment: Normal Reference [place] Chlamydia - Negative Comment: Normal Reference Range Neisseria Gonorrhea - Negative     Screening for MPX risk: Does the patient have an unexplained rash? No Is the patient MSM? No Does the patient endorse multiple sex partners or anonymous sex partners? No Did the patient have close or sexual contact with a person diagnosed with MPX? No Has the patient traveled outside the [place] where MPX is endemic? No Is there a high clinical suspicion for MPX-- evidenced by one of the following No  -Unlikely to be chickenpox  -Lymphadenopathy  -Rash that present in same phase of  evolution on any given body part See flowsheet for further details and programmatic requirements.   Immunization history:  Immunization History  Administered Date(s) Administered   DTaP 09/10/1998, 11/10/1998, 01/15/1999, 12/13/1999, 06/24/2003   HIB (PRP-OMP) 09/10/1998, 11/10/1998,  12/13/1999, 03/08/2000   HPV Bivalent 06/22/2012, 09/10/2014, 03/12/2015   Hepatitis A 04/22/2009, 06/22/2012   Hepatitis B 09/10/1998, 11/10/1998, 12/13/1999   IPV 09/10/1998, 11/10/1998, 12/13/1999, 06/24/2003   MMR 03/08/2000, 06/24/2003, 12/08/2020   Meningococcal B, OMV 06/07/2016   Meningococcal polysaccharide vaccine (MPSV4) 07/28/2009   Tdap 07/28/2009, 02/06/2019, 09/15/2020   Varicella 03/08/2000, 04/22/2009, 12/08/2020     The following portions of the patient's history were reviewed and updated as appropriate: allergies, current medications, past medical history, past social history, past surgical history and problem list.  Objective:  There were no vitals filed for this visit.  Physical Exam Nursing note reviewed.  Constitutional:      Appearance: Normal appearance.  HENT:     Head: Normocephalic.     Salivary Glands: Right salivary gland is not diffusely enlarged or tender. Left salivary gland is not diffusely enlarged or tender.     Mouth/Throat:     Lips: Pink. No lesions.     Mouth: Mucous membranes are moist.     Tongue: No lesions. Tongue does not deviate from midline.     Pharynx: Oropharynx is clear. Uvula midline. No oropharyngeal exudate or posterior oropharyngeal erythema.     Tonsils: No tonsillar exudate.  Eyes:     General:        Right eye: No discharge.        Left eye: No discharge.  Pulmonary:     Effort: Pulmonary effort is normal.  Genitourinary:    Comments: Patient asymptomatic. Declines genital exam. Self-swabbing.  Lymphadenopathy:     Head:     Right side of head: No submental, submandibular, tonsillar, preauricular or posterior auricular adenopathy.     Left side of head: No submental, submandibular, tonsillar, preauricular or posterior auricular adenopathy.     Cervical: No cervical adenopathy.     Right cervical: No superficial or posterior cervical adenopathy.    Left cervical: No superficial or posterior cervical adenopathy.      Upper Body:     Right upper body: No supraclavicular or axillary adenopathy.     Left upper body: No supraclavicular or axillary adenopathy.  Skin:    General: Skin is warm and dry.     Comments: Skin tone appropriate for ethnicity. Assessed exposed areas only and back.   Neurological:     Mental Status: She is alert and oriented to person, place, and time.  Psychiatric:        Attention and Perception: Attention and perception normal.        Mood and Affect: Mood and affect normal.        Speech: Speech normal.        Behavior: Behavior normal. Behavior is cooperative.        Thought Content: Thought content normal.     Assessment and Plan:  Heather [name] is a 28 y.o. female presenting to the [facility] Department for STI screening  1. Screening for venereal disease (Primary)  - Chlamydia/Gonorrhea [place] [facility] - Gonococcus culture - HBV Antigen/Antibody [place] [facility] - HIV/HCV [place] [facility] - WET PREP FOR TRICH, YEAST, CLUE - Syphilis Serology, [place] [facility]  2. Bacterial vaginosis Wet prep positive for signs of BV. Treated per SO.  - metroNIDAZOLE (FLAGYL)  500 MG tablet; Take 1 tablet (500 mg total) by mouth 2 (two) times daily for 7 days.   Patient accepted all screenings including oral, vaginal CT/GC and bloodwork for HIV/RPR, and wet prep. Patient meets criteria for HepB screening? Yes. Ordered? yes Patient meets criteria for HepC screening? Yes. Ordered? yes  Treat wet prep per standing order Discussed time line for State Lab results and that patient will be called with positive results and encouraged patient to call if she had not heard in 2 weeks.  Counseled to return or seek care for continued or worsening symptoms Recommended repeat testing in 3 months with positive results. Recommended condom use with all sex for STI prevention.   Patient is currently using  nothing  to prevent pregnancy.    Return if symptoms worsen or fail to improve.  No  future appointments.  Total time with patient 15 minutes.   Edmonia James, NP

## 2024-02-07 LAB — GONOCOCCUS CULTURE

## 2024-04-03 ENCOUNTER — Ambulatory Visit
Admission: RE | Admit: 2024-04-03 | Discharge: 2024-04-03 | Disposition: A | Discharge status: Home / Self Care | Attending: Surgery | Admitting: Surgery

## 2024-04-03 ENCOUNTER — Ambulatory Visit: Admitting: Anesthesiology

## 2024-04-03 ENCOUNTER — Encounter
Admission: RE | Disposition: A | Discharge status: Home / Self Care | Payer: Self-pay | Source: Home / Self Care | Attending: Surgery

## 2024-04-03 ENCOUNTER — Other Ambulatory Visit: Payer: Self-pay

## 2024-04-03 DIAGNOSIS — I10 Essential (primary) hypertension: Secondary | ICD-10-CM | POA: Insufficient documentation

## 2024-04-03 DIAGNOSIS — K921 Melena: Secondary | ICD-10-CM | POA: Insufficient documentation

## 2024-04-03 DIAGNOSIS — Z800 Family history of malignant neoplasm of digestive organs: Secondary | ICD-10-CM | POA: Diagnosis not present

## 2024-04-03 DIAGNOSIS — L918 Other hypertrophic disorders of the skin: Secondary | ICD-10-CM | POA: Insufficient documentation

## 2024-04-03 DIAGNOSIS — K641 Second degree hemorrhoids: Secondary | ICD-10-CM | POA: Diagnosis not present

## 2024-04-03 DIAGNOSIS — K625 Hemorrhage of anus and rectum: Secondary | ICD-10-CM | POA: Diagnosis present

## 2024-04-03 HISTORY — PX: COLONOSCOPY: SHX5424

## 2024-04-03 LAB — POCT PREGNANCY, URINE: Preg Test, Ur: NEGATIVE

## 2024-04-03 SURGERY — COLONOSCOPY
Anesthesia: General | Site: Rectum

## 2024-04-03 MED ORDER — MIDAZOLAM HCL 2 MG/2ML IJ SOLN
INTRAMUSCULAR | Status: AC
  Filled 2024-04-03: qty 2

## 2024-04-03 MED ORDER — PROPOFOL 10 MG/ML IV BOLUS
INTRAVENOUS | Status: DC | PRN
  Administered 2024-04-03: 60 mg via INTRAVENOUS

## 2024-04-03 MED ORDER — MIDAZOLAM HCL 2 MG/2ML IJ SOLN
INTRAMUSCULAR | Status: DC | PRN
  Administered 2024-04-03: 2 mg via INTRAVENOUS

## 2024-04-03 MED ORDER — SODIUM CHLORIDE 0.9 % IV SOLN: INTRAVENOUS | Status: DC

## 2024-04-03 MED ORDER — DEXMEDETOMIDINE HCL IN NACL 200 MCG/50ML IV SOLN
INTRAVENOUS | Status: DC | PRN
  Administered 2024-04-03: 12 ug via INTRAVENOUS

## 2024-04-03 MED ORDER — PROPOFOL 500 MG/50ML IV EMUL
INTRAVENOUS | Status: DC | PRN
  Administered 2024-04-03: 165 ug/kg/min via INTRAVENOUS

## 2024-04-03 MED ORDER — LIDOCAINE HCL (CARDIAC) PF 100 MG/5ML IV SOSY
PREFILLED_SYRINGE | INTRAVENOUS | Status: DC | PRN
  Administered 2024-04-03: 80 mg via INTRAVENOUS

## 2024-04-03 NOTE — Anesthesia Postprocedure Evaluation (Signed)
 Anesthesia Post Note  Patient: Heather [name]  Procedure(s) Performed: COLONOSCOPY (Rectum)  Patient location during evaluation: Endoscopy Anesthesia Type: General Level of consciousness: awake and alert Pain management: pain level controlled Vital Signs Assessment: post-procedure vital signs reviewed and stable Respiratory status: spontaneous breathing, nonlabored ventilation, respiratory function stable and patient connected to nasal cannula oxygen Cardiovascular status: blood pressure returned to baseline and stable Postop Assessment: no apparent nausea or vomiting Anesthetic complications: no   No notable events documented.   Last Vitals:  Vitals:   04/03/24 0844 04/03/24 0854  BP: 119/81 125/87  Pulse:  (!) 45  Resp:  18  Temp:    SpO2:  100%    Last Pain:  Vitals:   04/03/24 0854  TempSrc:   PainSc: 0-No pain                 Portia Brittle Laquan Ludden

## 2024-04-03 NOTE — Interval H&P Note (Signed)
 History and Physical Interval Note:  04/03/2024 7:29 AM  Heather [name]  has presented today for surgery, with the diagnosis of K62.5 Bright red blood per rectum.  The various methods of treatment have been discussed with the patient and family. After consideration of risks, benefits and other options for treatment, the patient has consented to  Procedure(s): COLONOSCOPY (N/A) as a surgical intervention.  The patient's history has been reviewed, patient examined, no change in status, stable for surgery.  I have reviewed the patient's chart and labs.  Questions were answered to the patient's satisfaction.     Heather [name]

## 2024-04-03 NOTE — Transfer of Care (Signed)
 Immediate Anesthesia Transfer of Care Note  Patient: Heather [name]  Procedure(s) Performed: COLONOSCOPY (Rectum)  Patient Location: Endoscopy Unit  Anesthesia Type:General  Level of Consciousness: drowsy and patient cooperative  Airway & Oxygen Therapy: Patient Spontanous Breathing and Patient connected to face mask oxygen  Post-op Assessment: Report given to RN and Post -op Vital signs reviewed and stable  Post vital signs: Reviewed and stable  Last Vitals:  Vitals Value Taken Time  BP 87/56 04/03/24 0824  Temp 36.1 C 04/03/24 0823  Pulse 83 04/03/24 0825  Resp 20 04/03/24 0825  SpO2 98 % 04/03/24 0825  Vitals shown include unfiled device data.  Last Pain:  Vitals:   04/03/24 0823  TempSrc: Temporal  PainSc: Asleep         Complications: No notable events documented.

## 2024-04-03 NOTE — Anesthesia Preprocedure Evaluation (Signed)
 Anesthesia Evaluation  Patient identified by MRN, date of birth, ID band Patient awake    Reviewed: Allergy & Precautions, NPO status , Patient's Chart, lab work & pertinent test results  Airway Mallampati: III  TM Distance: >3 FB Neck ROM: full    Dental  (+) Chipped   Pulmonary neg shortness of breath, Current Smoker   Pulmonary exam normal        Cardiovascular hypertension, Normal cardiovascular exam     Neuro/Psych  Headaches, Seizures -, Well Controlled,   negative psych ROS   GI/Hepatic negative GI ROS, Neg liver ROS,neg GERD  ,,  Endo/Other  negative endocrine ROS    Renal/GU negative Renal ROS  negative genitourinary   Musculoskeletal   Abdominal   Peds  Hematology negative hematology ROS (+)   Anesthesia Other Findings Past Medical History: No date: Seizures (HCC)     Comment:  last seizure November 2019  Past Surgical History: 07/11/2021: APPENDECTOMY  BMI    Body Mass Index: 21.76 kg/m      Reproductive/Obstetrics negative OB ROS                             Anesthesia Physical Anesthesia Plan  ASA: 3  Anesthesia Plan: General   Post-op Pain Management:    Induction: Intravenous  PONV Risk Score and Plan: Propofol infusion and TIVA  Airway Management Planned: Natural Airway and Nasal Cannula  Additional Equipment:   Intra-op Plan:   Post-operative Plan:   Informed Consent: I have reviewed the patients History and Physical, chart, labs and discussed the procedure including the risks, benefits and alternatives for the proposed anesthesia with the patient or authorized representative who has indicated his/her understanding and acceptance.     Dental Advisory Given  Plan Discussed with: Anesthesiologist, CRNA and Surgeon  Anesthesia Plan Comments: (Patient consented for risks of anesthesia including but not limited to:  - adverse reactions to  medications - risk of airway placement if required - damage to eyes, teeth, lips or other oral mucosa - nerve damage due to positioning  - sore throat or hoarseness - Damage to heart, brain, nerves, lungs, other parts of body or loss of life  Patient voiced understanding and assent.)       Anesthesia Quick Evaluation

## 2024-04-03 NOTE — Anesthesia Procedure Notes (Signed)
 Procedure Name: General with mask airway Date/Time: 04/03/2024 8:05 AM  Performed by: Niki Barter, CRNAPre-anesthesia Checklist: Patient identified, Emergency Drugs available, Suction available and Patient being monitored Patient Re-evaluated:Patient Re-evaluated prior to induction Oxygen Delivery Method: Simple face mask Induction Type: IV induction Placement Confirmation: positive ETCO2 and breath sounds checked- equal and bilateral Dental Injury: Teeth and Oropharynx as per pre-operative assessment

## 2024-04-03 NOTE — Op Note (Signed)
 [facility] Patient Name: Heather [name] Procedure Date: 04/03/2024 7:52 AM MRN: [id number] Account #: [id number] Date of Birth: 04/07/1996 Admit Type: Outpatient Age: 28 Room: [facility] ENDO ROOM 1 Gender: Female Note Status: Finalized Instrument Name: Charlyn Cooley [id number] Procedure:             Colonoscopy Indications:           Hematochezia Providers:             Conrado Delay MD, MD Medicines:             Propofol per Anesthesia Complications:         No immediate complications. Procedure:             Pre-Anesthesia Assessment:                        - After reviewing the risks and benefits, the patient                         was deemed in satisfactory condition to undergo the                         procedure in an ambulatory setting.                        After obtaining informed consent, the colonoscope was                         passed under direct vision. Throughout the procedure,                         the patient's blood pressure, pulse, and oxygen                         saturations were monitored continuously. The                         Colonoscope was introduced through the anus and                         advanced to the the cecum, identified by the ileocecal                         valve. The colonoscopy was performed without                         difficulty. The patient tolerated the procedure well.                         The quality of the bowel preparation was good. Findings:      Hemorrhoids were found on perianal exam.      Non-bleeding internal hemorrhoids were found during retroflexion. The       hemorrhoids were Grade II (internal hemorrhoids that prolapse but reduce       spontaneously).      The exam was otherwise without abnormality. Impression:            - Hemorrhoids found on perianal exam.                        -  Non-bleeding internal hemorrhoids.                        - The examination was otherwise normal.                         - No specimens collected. Recommendation:        - Discharge patient to home.                        - Resume previous diet.                        - Repeat colonoscopy PRN for screening purposes. Procedure Code(s):     --- Professional ---                        [phone], Colonoscopy, flexible; diagnostic, including                         collection of specimen(s) by brushing or washing, when                         performed (separate procedure) Diagnosis Code(s):     --- Professional ---                        K64.1, Second degree hemorrhoids                        K92.1, Melena (includes Hematochezia) CPT copyright 2022 American Medical Association. All rights reserved. The codes documented in this report are preliminary and upon coder review may  be revised to meet current compliance requirements. Dr. Ward Guy, MD Conrado Delay MD, MD 04/03/2024 8:25:43 AM This report has been signed electronically. Number of Addenda: 0 Note Initiated On: 04/03/2024 7:52 AM Scope Withdrawal Time: 0 hours 5 minutes 59 seconds  Total Procedure Duration: 0 hours 13 minutes 42 seconds  Estimated Blood Loss:  Estimated blood loss: none.      [facility]

## 2024-04-03 NOTE — H&P (Signed)
 Subjective:  CC: BRBPR (bright red blood per rectum) [K62.5]   HPI: Heather [name] is a 28 y.o. female who was referrred by Jacqulin Maus, MD for above. Symptoms were first noted 1 year ago. she has some rectal bleeding occurring a few times per week. Bleeding is described as moderate, staining her clothes at night . Pain is achy and intermittent, confined to the perianal area, without radiation. Associated with skin tags, exacerbated by nothing specific  Toilet habits: Mult BM a day, somewhat formed initially, then loose.  Grandmother with hx of colon CA. She also endorses unintentional wt loss of over 20lbs past month or so. No reported trauma to area.  Past Medical History: has a past medical history of Seizures (CMS/HHS-HCC).  Past Surgical History: has no past surgical history on file.  Family History: family history is not on file.  Social History: reports that she has never smoked. She has never used smokeless tobacco. She reports that she does not currently use alcohol. She reports that she does not currently use drugs.  Current Medications: has a current medication list which includes the following prescription(s): acetaminophen , diazepam , ergocalciferol (vitamin d2), and levetiracetam .  Allergies:  Allergies as of 03/25/2024  (No Known Allergies)   ROS:  A 15 point review of systems was performed and pertinent positives and negatives noted in HPI  Objective:    BP 122/89  Pulse 110  Ht 165.1 cm (5\' 5" )  Wt 63 kg (139 lb)  LMP 03/06/2024  BMI 23.13 kg/m   Constitutional : No distress, cooperative, alert  Lymphatics/Throat: Supple with no lymphadenopathy  Respiratory: Clear to auscultation bilaterally  Cardiovascular: Regular rate and rhythm  Gastrointestinal: Soft, non-tender, non-distended, no organomegaly.  Musculoskeletal: Steady gait and movement  Skin: Cool and moist  Psychiatric: Normal affect, non-agitated, not confused  Rectal: Chaperone present  for exam. External exam notable for skin tags, DRE revealed fullness at anal verge consistent with hemorhroids. No mass or other pathology palpable. Anoscopy deferred due to pt discomfort.    LABS:  N/a   RADS: N/a Assessment:    BRBPR (bright red blood per rectum) [K62.5]  Skin tags Hemorrhoids  Plan:   1. BRBPR (bright red blood per rectum) [K62.5] amount of bleeding reported with pictures of stained clothes along with frequency, unintentional wt loss, and overall mild hemorrhoids warrants colonoscopy for further investigation. Will schedule.  R/b/a discussed. Risks include bleeding, perforation. Benefits include diagnostic, curative procedure if needed. Alternatives include continued observation. Pt verbalized understanding.  labs/images/medications/previous chart entries reviewed personally and relevant changes/updates noted above.

## 2024-04-03 NOTE — OR Nursing (Signed)
 Drs. Rosea Conch and [place] alerted to pt's heart rhythm, brady, inverted P wave, irregular, Dr. Vonnie Gubler found a previous EKG that looks the same, he is fine with today's rhythm.  Pt denies CP will be d/c'd.

## 2024-04-04 ENCOUNTER — Encounter: Payer: Self-pay | Admitting: Surgery

## 2024-04-11 ENCOUNTER — Ambulatory Visit: Payer: Self-pay | Admitting: Surgery

## 2024-04-11 ENCOUNTER — Encounter
Admission: RE | Admit: 2024-04-11 | Discharge: 2024-04-11 | Disposition: A | Discharge status: Home / Self Care | Source: Ambulatory Visit | Attending: Surgery | Admitting: Surgery

## 2024-04-11 ENCOUNTER — Other Ambulatory Visit: Payer: Self-pay

## 2024-04-11 VITALS — Ht 65.0 in | Wt 132.0 lb

## 2024-04-11 DIAGNOSIS — Z01818 Encounter for other preprocedural examination: Secondary | ICD-10-CM

## 2024-04-11 HISTORY — DX: Headache, unspecified: R51.9

## 2024-04-11 HISTORY — DX: Nontraumatic subarachnoid hemorrhage, unspecified: I60.9

## 2024-04-11 NOTE — Patient Instructions (Signed)
 Your procedure is scheduled on: Friday 04/19/24 To find out your arrival time, please call [phone] between 1PM - 3PM on: Thursday 04/18/24   Report to the Registration Desk on the 1st floor of the Medical Mall. Free Valet parking is available.  If your arrival time is 6:00 am, do not arrive before that time as the [facility] entrance doors do not open until 6:00 am.  REMEMBER: Instructions that are not followed completely may result in serious medical risk, up to and including death; or upon the discretion of your surgeon and anesthesiologist your surgery may need to be rescheduled.  Do not eat food after midnight the night before surgery.  No gum chewing or hard candies.  You may however, drink CLEAR liquids up to 2 hours before you are scheduled to arrive for your surgery. Do not drink anything within 2 hours of your scheduled arrival time.  Clear liquids include: - water  - apple juice without pulp - gatorade (not RED colors) - black coffee or tea (Do NOT add milk or creamers to the coffee or tea) Do NOT drink anything that is not on this list.  Type 1 and Type 2 diabetics should only drink water.  One week prior to surgery: Stop Anti-inflammatories (NSAIDS) such as Advil , Aleve , Ibuprofen , Motrin , Naproxen , Naprosyn  and Aspirin based products such as Excedrin, Goody's Powder, BC Powder. You may however, continue to take Tylenol  if needed for pain up until the day of surgery.  Stop ANY OVER THE COUNTER supplements and vitamins until after surgery.  Continue taking all prescribed medications.   TAKE ONLY THESE MEDICATIONS THE MORNING OF SURGERY WITH A SIP OF WATER:  levETIRAcetam  (KEPPRA ) 750 MG tablet   No Alcohol for 24 hours before or after surgery.  No Smoking including e-cigarettes for 24 hours before surgery.  No chewable tobacco products for at least 6 hours before surgery.  No nicotine  patches on the day of surgery.  Do not use any "recreational" drugs for  at least a week (preferably 2 weeks) before your surgery.  Please be advised that the combination of cocaine and anesthesia may have negative outcomes, up to and including death. If you test positive for cocaine, your surgery will be cancelled.  On the morning of surgery brush your teeth with toothpaste and water, you may rinse your mouth with mouthwash if you wish. Do not swallow any toothpaste or mouthwash.  Shower before arriving for surgery  Do not wear lotions, powders, or perfumes.   Do not shave body hair from the neck down 48 hours before surgery.  Wear comfortable clothing (specific to your surgery type) to the hospital.  Do not wear jewelry, make-up, hairpins, clips or nail polish.  For welded (permanent) jewelry: bracelets, anklets, waist bands, etc.  Please have this removed prior to surgery.  If it is not removed, there is a chance that hospital personnel will need to cut it off on the day of surgery. Contact lenses, hearing aids and dentures may not be worn into surgery.  Do not bring valuables to the hospital. [facility] is not responsible for any missing/lost belongings or valuables.   Notify your doctor if there is any change in your medical condition (cold, fever, infection).  If you are being discharged the day of surgery, you will not be allowed to drive home. You will need a responsible individual to drive you home and stay with you for 24 hours after surgery.   After surgery, you can  help prevent lung complications by doing breathing exercises.  Take deep breaths and cough every 1-2 hours. Your doctor may order a device called an Incentive Spirometer to help you take deep breaths.  Surgery Visitation Policy:  Patients undergoing a surgery or procedure may have two family members or support persons with them as long as the person is not COVID-19 positive or experiencing its symptoms.   Please call the Pre-admissions Testing Dept. at [phone] if you have  any questions about these instructions.

## 2024-04-11 NOTE — H&P (Signed)
 Subjective:  CC: BRBPR (bright red blood per rectum) [K62.5]   HPI: Heather [name] is a 28 y.o. female who was referrred by Jacqulin Maus, MD for above. Symptoms were first noted 1 year ago. she has some rectal bleeding occurring a few times per week. Bleeding is described as moderate, staining her clothes at night . Pain is achy and intermittent, confined to the perianal area, without radiation. Associated with skin tags, exacerbated by nothing specific  Toilet habits: Mult BM a day, somewhat formed initially, then loose.  Grandmother with hx of colon CA. She also endorses unintentional wt loss of over 20lbs past month or so. No reported trauma to area.  Past Medical History: has a past medical history of Seizures (CMS/HHS-HCC).  Past Surgical History: has no past surgical history on file.  Family History: family history is not on file.  Social History: reports that she has never smoked. She has never used smokeless tobacco. She reports that she does not currently use alcohol. She reports that she does not currently use drugs.  Current Medications: has a current medication list which includes the following prescription(s): acetaminophen , diazepam , ergocalciferol (vitamin d2), and levetiracetam .  Allergies:  Allergies as of 03/25/2024  (No Known Allergies)   ROS:  A 15 point review of systems was performed and pertinent positives and negatives noted in HPI  Objective:    BP 122/89  Pulse 110  Ht 165.1 cm (5\' 5" )  Wt 63 kg (139 lb)  LMP 03/06/2024  BMI 23.13 kg/m   Constitutional : No distress, cooperative, alert  Lymphatics/Throat: Supple with no lymphadenopathy  Respiratory: Clear to auscultation bilaterally  Cardiovascular: Regular rate and rhythm  Gastrointestinal: Soft, non-tender, non-distended, no organomegaly.  Musculoskeletal: Steady gait and movement  Skin: Cool and moist  Psychiatric: Normal affect, non-agitated, not confused  Rectal: Chaperone present  for exam. External exam notable for skin tags, DRE revealed fullness at anal verge consistent with hemorhroids. No mass or other pathology palpable. Anoscopy deferred due to pt discomfort.    LABS:  N/a   RADS: N/a Assessment:    BRBPR (bright red blood per rectum) [K62.5]  Skin tags Hemorrhoids  Plan:   1. BRBPR (bright red blood per rectum) [K62.5] amount of bleeding reported with pictures of stained clothes along with frequency, unintentional wt loss, and overall mild hemorrhoids warrants colonoscopy for further investigation. Will schedule.  R/b/a discussed. Risks include bleeding, perforation. Benefits include diagnostic, curative procedure if needed. Alternatives include continued observation. Pt verbalized understanding.  labs/images/medications/previous chart entries reviewed personally and relevant changes/updates noted above.

## 2024-04-19 ENCOUNTER — Encounter: Admission: RE | Payer: Self-pay | Source: Home / Self Care

## 2024-04-19 ENCOUNTER — Ambulatory Visit: Admission: RE | Admit: 2024-04-19 | Source: Home / Self Care | Admitting: Surgery

## 2024-04-19 SURGERY — HEMORRHOIDECTOMY
Anesthesia: General | Site: Rectum

## 2024-05-07 ENCOUNTER — Ambulatory Visit

## 2024-05-14 NOTE — Progress Notes (Unsigned)
 [employer] HEALTH DEPARTMENT [facility] [address], [address] [place] [address] Main phone: [phone]  Family Planning Visit - Repeat Yearly Visit  Subjective:  Heather [name] is a 28 y.o. [id number]  being seen today for an annual wellness visit and to discuss contraception options. The patient is currently using {Upstream End Methods:32236} for pregnancy prevention. Patient does not want a pregnancy in the next year.   Patient reports they are looking for a method with the following characteristics:  {Contraception wants:32235:p}  Patient has the following medical problems:  Patient Active Problem List   Diagnosis Date Noted   Subarachnoid bleed (HCC) 09/19/2022   Status epilepticus (HCC) 09/18/2022   Elevated lactic acid level 09/18/2022   Sinus bradycardia 09/18/2022   Smoker 01/25/2022   Obesity BMI=30.0 05/05/2021   Migraines dx'd 2016 05/05/2021   Marijuana user 07/15/2020   Gestational hypertension 04/01/19 04/01/2019   Seizure (HCC) since age 88; last seizure 07/2020; self d/c'd Tegretal 2018 02/28/2018   Herpes 05/24/16 05/24/2016   Acne 12/06/2015   Family history of breast cancer in female 11/30/2015   Eczema 01/14/2014   No chief complaint on file.   HPI Patient is a pleasant 28 y.o. female who presents to the office today for annual well woman exam, PAP, CBE, asymptomatic STI testing, and to discuss contraception  Patient reports concerns today include the following:  Patient indicates *** {jisex:32888} partners in the last 2 and 12 months. She reports practicing {jivagoran:32898} and {jicondoms:32912}. Patient indicates {jistihistory:32900} {jiSTI:32901}. Patient reports last sex was ***. She indicates {jicontraceptionuse:32902} {jibirthcontrolmethods:32904} as contraception method.  Patient indicates LMP was ***.    ROS  See flowsheet for further details and programmatic requirements Hyperlink available at the top of  the signed note in blue.  Flow sheet content below:     Diabetes screening This patient is 28 y.o. with a BMI of There is no height or weight on file to calculate BMI..  Is patient eligible for diabetes screening (age >35 and BMI >25)?  not applicable  Was Hgb A1c ordered? not applicable  STI screening Patient reports {NUMBER 1-10:22536} of partners in last year.  Does this patient desire STI screening?  {Yes or If no, why not?:20788}  Hepatitis C screening Has patient been screened once for HCV in the past?  Yes  No results found for: HCVAB  Does the patient meet criteria for HCV testing? Yes  (If yes-- Screen for HCV through [facility]) Criteria:  Since the last HCV result, does the patient have any of the following? - Current drug use - Have a partner with drug use - Has been incarcerated  Hepatitis B screening Does the patient meet criteria for HBV testing? Yes Criteria:  -Household, sexual or needle sharing contact with HBV -History of drug use -HIV positive -Those with known Hep C  Cervical Cancer Screening  Result Date Procedure Results Follow-ups  07/06/2020 Cytology - PAP Neisseria Gonorrhea: Negative Chlamydia: Negative Trichomonas: Negative Adequacy: Satisfactory for evaluation; transformation zone component PRESENT. Diagnosis: - Negative for intraepithelial lesion or malignancy (NILM) Microorganisms: Fungal organisms present consistent with Candida spp. Comment: Normal Reference Range Trichomonas - Negative Comment: Normal Reference Ranger Chlamydia - Negative Comment: Normal Reference Range Neisseria Gonorrhea - Negative     Health Maintenance Due  Topic Date Due   COVID-19 Vaccine (1) Never done   Meningococcal B Vaccine (2 of 2 - Bexsero SCDM 2-dose series) 12/08/2016   Pneumococcal Vaccine 33-69 Years old (  1 of 2 - PCV) Never done   Cervical Cancer Screening (Pap smear)  07/07/2023    The following portions of the patient's history were  reviewed and updated as appropriate: allergies, current medications, past family history, past medical history, past social history, past surgical history and problem list. Problem list updated.  Objective:  There were no vitals filed for this visit.  Physical Exam  Assessment and Plan:  Heather [name] is a 28 y.o. female [phone] presenting to the [facility] Department for an yearly wellness and contraception visit  1. Family planning (Primary) Contraception counseling:  Reviewed options based on patient desire and reproductive life plan. Patient is interested in {Upstream End Methods:24109}. This {WAS/WAS [phone]::was not} provided to the patient today.   Risks, benefits, and typical effectiveness rates were reviewed.  Questions were answered.  Written information was also given to the patient to review.    The patient will follow up in  {NUMBER 1-10:22536} {days/wks/mos/yrs:310907} for surveillance.  The patient was told to call with any further questions, or with any concerns about this method of contraception.  Emphasized use of condoms 100% of the time for STI prevention.  Emergency Contraception Precautions (ECP): Patient assessed for need of ECP. She {ACTION; IS/IS WNU:[id number]} a candidate based on {sex options:32690}.  Educated on ECP and reviewed options.  Patient desires {ecp options:32691}.   2. Well woman exam with routine gynecological exam Regarding patient concerns today ***  PAP collected today.  CBE completed today. Next due in 1-3 years per ACOG guidelines or sooner based on concerns.  Patient has PCP.    3. Screening for venereal disease    No follow-ups on file.  Future Appointments  Date Time Provider Department Center  05/15/2024  8:35 AM [facility] PROVIDER [facility] None    Clarita LITTIE Narrow, NP

## 2024-05-15 ENCOUNTER — Ambulatory Visit

## 2024-05-15 DIAGNOSIS — Z01419 Encounter for gynecological examination (general) (routine) without abnormal findings: Secondary | ICD-10-CM

## 2024-05-15 DIAGNOSIS — Z3009 Encounter for other general counseling and advice on contraception: Secondary | ICD-10-CM

## 2024-05-15 DIAGNOSIS — Z113 Encounter for screening for infections with a predominantly sexual mode of transmission: Secondary | ICD-10-CM

## 2024-06-14 ENCOUNTER — Ambulatory Visit

## 2024-06-28 ENCOUNTER — Ambulatory Visit

## 2024-08-05 ENCOUNTER — Ambulatory Visit: Payer: Self-pay | Admitting: Surgery

## 2024-08-05 NOTE — H&P (Signed)
 Subjective:  CC: Grade II hemorrhoids [K64.1]   HPI: Returns for above.   History of Present Illness Heather [name] is a 28 year old female with a history of hemorrhoids who presents for a surgical consult regarding hemorrhoids.  She has experienced hemorrhoids for five years, with a recent exacerbation on Thursday, identifying three external hemorrhoids. These cause significant bleeding and pain during defecation. The bleeding is bright red, and the pain is sharp and constant, worsening with sitting, walking, and using the bathroom.  She has attempted treatment with witch hazel and age cream without improvement. Her diet includes adequate water and fiber intake, and she typically has three bowel movements per day. A colonoscopy was performed in June or July which only showed hemorrhoids  No nausea, vomiting, diarrhea, abdominal pain, or constipation.    Past Medical History:  has a past medical history of Seizures (CMS/HHS-HCC).  Past Surgical History:  has a past surgical history that includes Colonoscopy (N/A, 04/03/2024) and Hemorroidectomy (N/A, 04/03/2024).  Family History: family history is not on file.  Social History:  reports that she has never smoked. She has never used smokeless tobacco. She reports that she does not currently use alcohol. She reports that she does not currently use drugs.  Current Medications: has a current medication list which includes the following prescription(s): acetaminophen , cyanocobalamin, diazepam , levetiracetam , and ergocalciferol (vitamin d2).  Allergies:  Allergies as of 08/05/2024   (No Known Allergies)    ROS:  A 15 point review of systems was performed and pertinent positives and negatives noted in HPI  Objective:     BP 107/70   Pulse 67   Ht 167.6 cm (5' 6)   Wt 71.7 kg (158 lb)   LMP 08/02/2024   BMI 25.50 kg/m   Constitutional :  No distress, cooperative, alert  Lymphatics/Throat:  Supple with no lymphadenopathy   Respiratory:  Clear to auscultation bilaterally  Cardiovascular:  Regular rate and rhythm  Gastrointestinal: Soft, non-tender, non-distended, no organomegaly.  Musculoskeletal: Steady gait and movement  Skin: Cool and moist  Psychiatric: Normal affect, non-agitated, not confused  Rectal: deferred      LABS:  N/a   RADS: N/a  Assessment:     Grade II hemorrhoids [K64.1]  Plan:    1. Grade II hemorrhoids [K64.1] Discussed risks/benefits/alternatives to surgery.  Alternatives include the options of observation, medical management.  Benefits include symptomatic relief.  I discussed  in detail and the complications related to the operation and the anesthesia, including bleeding, infection, recurrence, remote possibility of temporary or permanent fecal incontinence, poor/delayed wound healing, chronic pain, and additional procedures to address said risks. The risks of general anesthetic, if used, includes MI, CVA, sudden death or even reaction to anesthetic medications also discussed.   We also discussed typical post operative recovery which includes weeks to potentially months of anal pain, drainage, occasional bleeding, and sense of fecal urgency.    ED return precautions given for sudden increase in pain, bleeding, with possible accompanying fever, nausea, and/or vomiting.  The patient understands the risks, any and all questions were answered to the patient's satisfaction.  2. Patient has elected to proceed with surgical treatment. Procedure will be scheduled.  labs/images/medications/previous chart entries reviewed personally and relevant changes/updates noted above.

## 2024-08-15 ENCOUNTER — Ambulatory Visit

## 2024-08-16 ENCOUNTER — Ambulatory Visit

## 2024-08-22 ENCOUNTER — Ambulatory Visit

## 2024-08-22 VITALS — BP 119/72 | HR 70 | Ht 66.0 in | Wt 147.0 lb

## 2024-08-22 DIAGNOSIS — Z124 Encounter for screening for malignant neoplasm of cervix: Secondary | ICD-10-CM

## 2024-08-22 DIAGNOSIS — Z309 Encounter for contraceptive management, unspecified: Secondary | ICD-10-CM | POA: Diagnosis not present

## 2024-08-22 DIAGNOSIS — Z113 Encounter for screening for infections with a predominantly sexual mode of transmission: Secondary | ICD-10-CM

## 2024-08-22 DIAGNOSIS — Z3042 Encounter for surveillance of injectable contraceptive: Secondary | ICD-10-CM

## 2024-08-22 DIAGNOSIS — Z3009 Encounter for other general counseling and advice on contraception: Secondary | ICD-10-CM

## 2024-08-22 DIAGNOSIS — Z30013 Encounter for initial prescription of injectable contraceptive: Secondary | ICD-10-CM | POA: Diagnosis not present

## 2024-08-22 LAB — WET PREP FOR TRICH, YEAST, CLUE
Clue Cell Exam: NEGATIVE
Trichomonas Exam: NEGATIVE
Yeast Exam: NEGATIVE

## 2024-08-22 LAB — HM HIV SCREENING LAB: HM HIV Screening: NEGATIVE

## 2024-08-22 MED ORDER — MEDROXYPROGESTERONE ACETATE 150 MG/ML IM SUSP
150.0000 mg | INTRAMUSCULAR | Status: AC
  Administered 2024-08-22: 150 mg via INTRAMUSCULAR

## 2024-08-22 NOTE — Progress Notes (Signed)
 Pt is her for PE and Depo IM Injection. Injection given to pt at the Lt deltoid and pt tolerated well to injection. Reminder card given and condoms declined, Kwadwo Vanessa Alesi,RN.

## 2024-08-22 NOTE — Progress Notes (Signed)
 [employer] HE[facility] DEPARTMENT [facility] [address] [place] [address] Main phone: [phone]  Family Planning Visit - Repeat Yearly Visit  Subjective:  Heather [name] is a 28 y.o. [id number]  being seen today for an annual wellness visit and to discuss contraception options. The patient is currently using hormonal injection for pregnancy prevention. Patient does not want a pregnancy in the next year.   Patient reports they would like Depo Provera  initiated today.  Patient has the following medical problems:  Patient Active Problem List   Diagnosis Date Noted   Subarachnoid bleed (HCC) 09/19/2022   Status epilepticus (HCC) 09/18/2022   Elevated lactic acid level 09/18/2022   Sinus bradycardia 09/18/2022   Smoker 01/25/2022   Obesity BMI=30.0 05/05/2021   Migraines dx'd 2016 05/05/2021   Marijuana user 07/15/2020   Gestational hypertension 04/01/19 04/01/2019   Seizure (HCC) since age 59; last seizure 07/2020; self d/c'd Tegretal 2018 02/28/2018   Herpes 05/24/16 05/24/2016   Acne 12/06/2015   Family history of breast cancer in female 11/30/2015   Eczema 01/14/2014   Chief Complaint  Patient presents with   Annual Exam    PE/Depo   HPI Patient reports multiple medical problems: Seizures - last seizure 2-3 years ago, on Keppra  Subarachnoid bleed from fall related to seizure Migraine w/ aura - last migraine about 2 weeks ago  Current smoker - trying to quit. Has patches at home and plans to quit soon. Smokes 8 cigarettes/day and yet to have a quit date planned. Patient denies vaginal or breast concerns.  She has been on Depo in the past, liked it other than a bit of weight gain.   Review of Systems  Neurological:  Positive for headaches.  All other systems reviewed and are negative.  See flowsheet for further details and programmatic requirements Hyperlink available at the top of the signed note in blue.  Flow sheet content  below:  Pregnancy Intention Screening Does the patient want to become pregnant in the next year?: No Does the patient's partner want to become pregnant in the next year?: No Would the patient like to discuss contraceptive options today?: Yes Other:  Password: 85 Sexual History What age did you start your period?: 16 How often do you have your period?: monthly Date of last sex?: 08/07/24 Has the patient had unprotected sex within the last 5 days?: No Do you have sex with men, women, both men and women?: Men only In the past 2 months how many partners have you had sex with?: 1 In the past 12 months, how many partners have you had sex with?: 1 Is it possible that any of your sex partners in the past 12 months had sex with someone else whild they were still in a sexual relationship with you?: Yes What ways do you have sex?: Vaginal Do you or your partner use condoms and/or dental dams every time you have vaginal, oral or anal sex?: Sometimes Do you douche?: No Date of last HIV test?: 02/01/24 Have you ever had an STD?: Yes Have any of your partners had an STD?: Yes Partner Previous STD?: Chlamydia Date?:  (pt report  5 years ago) Have you or your partner ever shot up drugs?: No Have any of your partners used drugs in the past?: No Have you or your partners exchanged money or drugs for sex?: No Risk Factors for Hep B Household, sexual, or needle sharing contact of a person infected with Hep B: No  Sexual contact with a person who uses drugs not as prescribed?: No Currently or Ever used drugs not as prescribed: No HIV Positive: No PRep Patient: No Men who have sex with men: No Have Hepatitis C: No History of Incarceration: No History of Homeslessness?: No Anal sex following anal drug use?: No Risk Factors for Hep C Currently using drugs not as prescribed: No Sexual partner(s) currently using drugs as not prescribed: No History of drug use: No HIV Positive: No People with a  history of incarceration: No People born between the years of 62 and 110: No Advise Advised client to quit or stay quit. : Yes Counseling All Patients: LARCS discussed, Use specific methods of contraceoptive and identify adverse effects (R), Delay future pregnancy from 18 months to 5 years (R) at [facility] visit, Typical use rates for method effectiveness (R) Education: Make informed decision about family planning, Understand BMI >25 or >18.5 is a health risk (weight management educational materials to be provided to client requests), Teach back method completed, Warning signs for rare but serious adverse events and what to do if they experience a warning sign (including emergency 24 hour number, where to seek emergency service outside of hours of operation), When to return for follow up (planned return schedule) Contraception Wrap Up Current Method: No Method - Other Reason Reason for No Current Contraceptive Method at Intake ([facility] Only): Sterile for non-contraceptive reasons End Method: Hormonal Injection Contraception Counseling Provided: Yes How was the end contraceptive method provided?: Provided on site  Diabetes screening This patient is 28 y.o. with a BMI of Body mass index is 23.73 kg/m.SABRA  Is patient eligible for diabetes screening (age >35 and BMI >25)?  no  Was Hgb A1c ordered? no  STI screening Patient reports 1 of partners in last year.  Does this patient desire STI screening?  Yes  Hepatitis C screening Has patient been screened once for HCV in the past?  Yes  No results found for: HCVAB  Does the patient meet criteria for HCV testing? No  (If yes-- Screen for HCV through [facility]) Criteria:  Since the last HCV result, does the patient have any of the following? - Current drug use - Have a partner with drug use - Has been incarcerated  Hepatitis B screening Does the patient meet criteria for HBV testing? No Criteria:  -Household, sexual or needle sharing contact  with HBV -History of drug use -HIV positive -Those with known Hep C  Cervical Cancer Screening  Pap due today  Result Date Procedure Results Follow-ups  07/06/2020 Cytology - PAP Neisseria Gonorrhea: Negative Chlamydia: Negative Trichomonas: Negative Adequacy: Satisfactory for evaluation; transformation zone component PRESENT. Diagnosis: - Negative for intraepithelial lesion or malignancy (NILM) Microorganisms: Fungal organisms present consistent with Candida spp. Comment: Normal Reference Range Trichomonas - Negative Comment: Normal Reference Ranger Chlamydia - Negative Comment: Normal Reference Range Neisseria Gonorrhea - Negative     Health Maintenance Due  Topic Date Due   COVID-19 Vaccine (1) Never done   Meningococcal B Vaccine (2 of 2 - Bexsero SCDM 2-dose series) 12/08/2016   Pneumococcal Vaccine (1 of 2 - PCV) Never done   Cervical Cancer Screening (Pap smear)  07/07/2023   Influenza Vaccine  Never done   The following portions of the patient's history were reviewed and updated as appropriate: allergies, current medications, past family history, past medical history, past social history, past surgical history and problem list. Problem list updated.  Objective:   Vitals:   08/22/24  0833  BP: 119/72  Pulse: 70  Weight: 147 lb (66.7 kg)  Height: 5' 6 (1.676 m)   Physical Exam Vitals and nursing note reviewed. Exam conducted with a chaperone present Brett Orange).  Constitutional:      Appearance: Normal appearance.  HENT:     Head: Normocephalic and atraumatic.     Mouth/Throat:     Mouth: Mucous membranes are moist.     Pharynx: Oropharynx is clear. No oropharyngeal exudate or posterior oropharyngeal erythema.  Pulmonary:     Effort: Pulmonary effort is normal.  Abdominal:     General: Abdomen is flat.     Palpations: There is no mass.     Tenderness: There is no abdominal tenderness. There is no rebound.  Genitourinary:    General: Normal vulva.     Exam  position: Lithotomy position.     Pubic Area: No rash or pubic lice.      Labia:        Right: No rash or lesion.        Left: No rash or lesion.      Vagina: Normal. No vaginal discharge, erythema, bleeding or lesions.     Cervix: No cervical motion tenderness, discharge, friability, lesion or erythema.  Lymphadenopathy:     Head:     Right side of head: No preauricular or posterior auricular adenopathy.     Left side of head: No preauricular or posterior auricular adenopathy.     Cervical: No cervical adenopathy.     Upper Body:     Right upper body: No supraclavicular, axillary or epitrochlear adenopathy.     Left upper body: No supraclavicular, axillary or epitrochlear adenopathy.  Skin:    General: Skin is warm and dry.     Findings: No rash.  Neurological:     Mental Status: She is alert and oriented to person, place, and time.    Assessment and Plan:  Heather [name] is a 28 y.o. female [phone] presenting to the [facility] Department for an yearly wellness and contraception visit.  Family planning  Contraception counseling:  Reviewed options based on patient desire and reproductive life plan. Patient is interested in Hormonal Injection. This was provided to the patient today.   Risks, benefits, and typical effectiveness rates were reviewed.  Questions were answered.  Written information was also given to the patient to review.    The patient will follow up in  1 years for surveillance.  The patient was told to call with any further questions, or with any concerns about this method of contraception.  Emphasized use of condoms 100% of the time for STI prevention.  Emergency Contraception Precautions (ECP): Patient assessed for need of ECP. She is not a candidate based on no intercourse for ~2 weeks.   2. Screening for venereal disease (Primary)  - Chlamydia/Gonorrhea [facility] - WET PREP FOR TRICH, YEAST, CLUE - Syphilis Serology, [facility] - HIV  [facility]  3. Screening for cervical cancer  - Pap NILM in 2021 - IGP, rfx Aptima HPV ASCU  4. Encounter for management and injection of depo-Provera   - Advised to take a pregnancy test at end of the month - around the 29th or 30th. LMP was 09/27 and she had unprotected sex on 10/1. No sex since then.  - medroxyPROGESTERone  (DEPO-PROVERA ) injection 150 mg   Return in about 1 year (around 08/22/2025).  Future Appointments  Date Time Provider Department Center  09/06/2024  2:30 PM [facility] PAT3 [facility] None   Damien FORBES Satchel, NP

## 2024-08-27 ENCOUNTER — Ambulatory Visit: Admitting: Family Medicine

## 2024-08-27 ENCOUNTER — Ambulatory Visit: Payer: Self-pay

## 2024-08-27 DIAGNOSIS — Z113 Encounter for screening for infections with a predominantly sexual mode of transmission: Secondary | ICD-10-CM

## 2024-08-27 DIAGNOSIS — B3731 Acute candidiasis of vulva and vagina: Secondary | ICD-10-CM | POA: Insufficient documentation

## 2024-08-27 LAB — IGP, RFX APTIMA HPV ASCU: PAP Smear Comment: 0

## 2024-08-27 MED ORDER — FLUCONAZOLE 150 MG PO TABS: 150.0000 mg | ORAL_TABLET | ORAL | 0 refills | Status: AC

## 2024-08-27 NOTE — Progress Notes (Unsigned)
 [facility] Department STI clinic [address] [place] [address] Main phone: [phone]  STI screening visit   Subjective:  Heather [name] is a 28 y.o. female being seen today for an STI screening visit. The patient reports they {Actions; do/do not:19616} have symptoms.  Patient reports that they {actions_preg:32608} a pregnancy in the next year. Patient is currently using {Upstream End Methods:32236} to prevent pregnancy. They reported they {Actions; are/are not:16769} interested in discussing contraception today.    Patient's last menstrual period was 08/03/2024 (exact date).  Patient has the following medical conditions:  Patient Active Problem List   Diagnosis Date Noted   Subarachnoid bleed (HCC) 09/19/2022   Status epilepticus (HCC) 09/18/2022   Elevated lactic acid level 09/18/2022   Sinus bradycardia 09/18/2022   Smoker 01/25/2022   Obesity BMI=30.0 05/05/2021   Migraines dx'd 2016 05/05/2021   Marijuana user 07/15/2020   Gestational hypertension 04/01/19 04/01/2019   Seizure (HCC) since age 71; last seizure 07/2020; self d/c'd Tegretal 2018 02/28/2018   Herpes 05/24/16 05/24/2016   Acne 12/06/2015   Family history of breast cancer in female 11/30/2015   Eczema 01/14/2014   Chief Complaint  Patient presents with   SEXUALLY TRANSMITTED DISEASE    HPI Patient reports ***  Does the patient using douching products? {yes/no:20286}  See flowsheet for further details and programmatic requirements Hyperlink available at the top of the signed note in blue.  Flow sheet content below:  Pregnancy Intention Screening Does the patient want to become pregnant in the next year?: No Does the patient's partner want to become pregnant in the next year?: No Would the patient like to discuss contraceptive options today?: No Counseling Patient counseled to use condoms with all sex: Condoms declined RTC in 2-3 weeks for test results:  Yes Clinic will call if test results abnormal before test result appt.: Yes Test results given to patient Patient counseled to use condoms with all sex: Condoms declined   Screening for MPX risk:  Unexplained rash?  {yes/no:20286}   MSM?  {yes/no:20286}   Multiple or anonymous sex partners?  {yes/no:20286}   Any close or sexual contact with a person  diagnosed with MPX?  {yes/no:20286}   Any outside the [place]  where MPX is endemic?  {yes/no:20286}   High clinical suspicion for MPX?    -Unlikely to be chickenpox    -Lymphadenopathy    -Rash that presents in same phase of       evolution on any given body part  {yes/no:20286}   Screenings: Last HIV test per patient/review of record was  Lab Results  Component Value Date   HMHIVSCREEN Negative - Validated 02/01/2024    Lab Results  Component Value Date   HIV Non Reactive 09/17/2022     Last HEPC test per patient/review of record was  Lab Results  Component Value Date   HMHEPCSCREEN Negative-Validated 02/01/2024   No components found for: HEPC   Last HEPB test per patient/review of record was No components found for: HMHEPBSCREEN   Patient reports last pap was:   Lab Results  Component Value Date   SPECADGYN Comment 08/22/2024   Result Date Procedure Results Follow-ups  08/22/2024 IGP, rfx Aptima HPV ASCU DIAGNOSIS:: Comment Specimen adequacy:: Comment Clinician Provided ICD10: Comment Performed by:: Comment PAP Smear Comment: . Note:: Comment Test Methodology: Comment PAP Reflex: Comment   07/06/2020 Cytology - PAP Neisseria Gonorrhea: Negative Chlamydia: Negative Trichomonas: Negative Adequacy: Satisfactory for evaluation; transformation zone component PRESENT. Diagnosis: -  Negative for intraepithelial lesion or malignancy (NILM) Microorganisms: Fungal organisms present consistent with Candida spp. Comment: Normal Reference Range Trichomonas - Negative Comment: Normal Reference Ranger Chlamydia -  Negative Comment: Normal Reference Range Neisseria Gonorrhea - Negative     Immunization history:  Immunization History  Administered Date(s) Administered   DTaP 09/10/1998, 11/10/1998, 01/15/1999, 12/13/1999, 06/24/2003   HIB (PRP-OMP) 09/10/1998, 11/10/1998, 12/13/1999, 03/08/2000   HPV Bivalent 06/22/2012, 09/10/2014, 03/12/2015   Hepatitis A 04/22/2009, 06/22/2012   Hepatitis B 09/10/1998, 11/10/1998, 12/13/1999   IPV 09/10/1998, 11/10/1998, 12/13/1999, 06/24/2003   MMR 03/08/2000, 06/24/2003, 12/08/2020   Meningococcal B, OMV 06/07/2016   Meningococcal polysaccharide vaccine (MPSV4) 07/28/2009   Tdap 07/28/2009, 02/06/2019, 09/15/2020   Varicella 03/08/2000, 04/22/2009, 12/08/2020    The following portions of the patient's history were reviewed and updated as appropriate: allergies, current medications, past medical history, past social history, past surgical history and problem list.  Objective:  There were no vitals filed for this visit. *** Physical Exam   Assessment and Plan:  Heather [name] is a 28 y.o. female presenting to the [facility] Department for STI screening  There are no diagnoses linked to this encounter.  Patient accepted the following screenings: {STI screen options ACHD:32723} Patient meets criteria for HepB screening? {yes/no:20286}. Ordered? {Response; yes/no/na:63} Patient meets criteria for HepC screening? {yes/no:20286}. Ordered? {Response; yes/no/na:63}  Treat wet prep per standing order Discussed time line for State Lab results and that patient will be called with positive results and encouraged patient to call if she had not heard in 2 weeks.  Counseled to return or seek care for continued or worsening symptoms Recommended repeat testing in 3 months with positive results. Recommended condom use with all sex for STI prevention.   No follow-ups on file.  Future Appointments  Date Time Provider Department Center  09/06/2024   2:30 PM [facility] None    Betsey CHRISTELLA Helling, MD

## 2024-08-27 NOTE — Patient Instructions (Signed)
 Yeast vaginitis  Fluconazole today and then on day #3.  We will also send in medicine for anything else found on the wet prep like BV or trichomonas.

## 2024-08-27 NOTE — Progress Notes (Unsigned)
 Pt here for STI screening.  Wet mount results reviewed with patient.  Results positive for moderate yeast.  Prescription sent for treatment of yeast to patient's pharmacy by Dr. KYM Helling.  Pt aware that she needs to pick up at her pharmacy.-Banks Chaikin, RN

## 2024-08-27 NOTE — Progress Notes (Signed)
 Pap is NILM - negative/normal cytology. Per current ASCCP guidelines, follow up/repeat in 3 years.  Heather [name] [facility]

## 2024-08-28 LAB — WET PREP FOR TRICH, YEAST, CLUE
Clue Cell Exam: NEGATIVE
Trichomonas Exam: NEGATIVE

## 2024-08-28 NOTE — Assessment & Plan Note (Signed)
 Acute. History, physical, and wet prep consistent. Patient prefers pill over cream. Rx fluconazole x2 tablets. Return precautions given.

## 2024-09-06 ENCOUNTER — Other Ambulatory Visit: Payer: Self-pay

## 2024-09-06 ENCOUNTER — Encounter
Admission: RE | Admit: 2024-09-06 | Discharge: 2024-09-06 | Disposition: A | Discharge status: Home / Self Care | Source: Ambulatory Visit | Attending: Surgery | Admitting: Surgery

## 2024-09-06 DIAGNOSIS — Z01812 Encounter for preprocedural laboratory examination: Secondary | ICD-10-CM

## 2024-09-06 DIAGNOSIS — Z01810 Encounter for preprocedural cardiovascular examination: Secondary | ICD-10-CM

## 2024-09-06 DIAGNOSIS — R001 Bradycardia, unspecified: Secondary | ICD-10-CM

## 2024-09-06 DIAGNOSIS — Z79899 Other long term (current) drug therapy: Secondary | ICD-10-CM

## 2024-09-06 HISTORY — DX: Cardiac murmur, unspecified: R01.1

## 2024-09-06 HISTORY — DX: Angina pectoris, unspecified: I20.9

## 2024-09-06 HISTORY — DX: Unspecified hemorrhoids: K64.9

## 2024-09-06 HISTORY — DX: Anemia, unspecified: D64.9

## 2024-09-06 NOTE — Patient Instructions (Addendum)
 Your procedure is scheduled on: 09/13/24 - Friday Report to the Registration Desk on the 1st floor of the Medical Mall. To find out your arrival time, please call [phone] between 1PM - 3PM on: 09/12/24 - THURSDAY If your arrival time is 6:00 am, do not arrive before that time as the [facility] entrance doors do not open until 6:00 am.  REMEMBER: Instructions that are not followed completely may result in serious medical risk, up to and including death; or upon the discretion of your surgeon and anesthesiologist your surgery may need to be rescheduled.  Do not eat food after midnight the night before surgery.  No gum chewing or hard candies.  You may however, drink CLEAR liquids up to 2 hours before you are scheduled to arrive for your surgery. Do not drink anything within 2 hours of your scheduled arrival time.  Clear liquids include: - water  - apple juice without pulp - gatorade (not RED colors) - black coffee or tea (Do NOT add milk or creamers to the coffee or tea) Do NOT drink anything that is not on this list.   One week prior to surgery: Stop Anti-inflammatories (NSAIDS) such as Advil , Aleve , Ibuprofen , Motrin , Naproxen , Naprosyn  and Aspirin based products such as Excedrin, Goody's Powder, BC Powder. You may take Tylenol  if needed for pain up until the day of surgery.  Stop ANY OVER THE COUNTER supplements until after surgery.  ON THE DAY OF SURGERY ONLY TAKE THESE MEDICATIONS WITH SIPS OF WATER:  levETIRAcetam  (KEPPRA )    No Alcohol for 24 hours before or after surgery.  No Smoking including e-cigarettes for 24 hours before surgery.  No chewable tobacco products for at least 6 hours before surgery.  No nicotine  patches on the day of surgery.  Do not use any recreational drugs for at least a week (preferably 2 weeks) before your surgery.  Please be advised that the combination of cocaine and anesthesia may have negative outcomes, up to and including death. If  you test positive for cocaine, your surgery will be cancelled.  On the morning of surgery brush your teeth with toothpaste and water, you may rinse your mouth with mouthwash if you wish. Do not swallow any toothpaste or mouthwash.  Do not wear jewelry, make-up, hairpins, clips or nail polish.  For welded (permanent) jewelry: bracelets, anklets, waist bands, etc.  Please have this removed prior to surgery.  If it is not removed, there is a chance that hospital personnel will need to cut it off on the day of surgery.  Do not wear lotions, powders, or perfumes.   Do not shave body hair from the neck down 48 hours before surgery.  Contact lenses, hearing aids and dentures may not be worn into surgery.  Do not bring valuables to the hospital. [facility] is not responsible for any missing/lost belongings or valuables.   Notify your doctor if there is any change in your medical condition (cold, fever, infection).  Wear comfortable clothing (specific to your surgery type) to the hospital.  If you are being admitted to the hospital overnight, leave your suitcase in the car. After surgery it may be brought to your room.  In case of increased patient census, it may be necessary for you, the patient, to continue your postoperative care in the Same Day Surgery department.  If you are being discharged the day of surgery, you will not be allowed to drive home. You will need a responsible individual to drive you  home and stay with you for 24 hours after surgery.   If you are taking public transportation, you will need to have a responsible individual with you.  Please call the Pre-admissions Testing Dept. at [phone] if you have any questions about these instructions.  Surgery Visitation Policy:  Patients having surgery or a procedure may have two visitors.  Children under the age of 39 must have an adult with them who is not the patient.  Inpatient Visitation:    Visiting hours are 7  a.m. to 8 p.m. Up to four visitors are allowed at one time in a patient room. The visitors may rotate out with other people during the day.  One visitor age 34 or older may stay with the patient overnight and must be in the room by 8 p.m.   Merchandiser, Retail to address health-related social needs:  [email or web address]

## 2024-09-12 MED ORDER — ORAL CARE MOUTH RINSE: 15.0000 mL | Freq: Once | OROMUCOSAL | Status: AC

## 2024-09-12 MED ORDER — CEFAZOLIN SODIUM-DEXTROSE 2-4 GM/100ML-% IV SOLN
2.0000 g | INTRAVENOUS | Status: AC
  Administered 2024-09-13: 2 g via INTRAVENOUS

## 2024-09-12 MED ORDER — CELECOXIB 200 MG PO CAPS
200.0000 mg | ORAL_CAPSULE | ORAL | Status: AC
Start: 2024-09-13 — End: 2024-09-14
  Administered 2024-09-13: 200 mg via ORAL

## 2024-09-12 MED ORDER — CHLORHEXIDINE GLUCONATE 0.12 % MT SOLN
15.0000 mL | Freq: Once | OROMUCOSAL | Status: AC
  Administered 2024-09-13: 15 mL via OROMUCOSAL

## 2024-09-12 MED ORDER — ACETAMINOPHEN 500 MG PO TABS
1000.0000 mg | ORAL_TABLET | ORAL | Status: AC
  Administered 2024-09-13: 1000 mg via ORAL

## 2024-09-12 MED ORDER — CHLORHEXIDINE GLUCONATE CLOTH 2 % EX PADS: 6.0000 | MEDICATED_PAD | Freq: Once | CUTANEOUS | Status: DC

## 2024-09-12 MED ORDER — GABAPENTIN 300 MG PO CAPS
300.0000 mg | ORAL_CAPSULE | ORAL | Status: AC
Start: 2024-09-13 — End: 2024-09-14
  Administered 2024-09-13: 300 mg via ORAL

## 2024-09-12 MED ORDER — LACTATED RINGERS IV SOLN: INTRAVENOUS | Status: DC

## 2024-09-13 ENCOUNTER — Other Ambulatory Visit: Payer: Self-pay

## 2024-09-13 ENCOUNTER — Encounter: Payer: Self-pay | Admitting: Surgery

## 2024-09-13 ENCOUNTER — Ambulatory Visit: Payer: Self-pay | Admitting: Urgent Care

## 2024-09-13 ENCOUNTER — Ambulatory Visit
Admission: RE | Admit: 2024-09-13 | Discharge: 2024-09-13 | Disposition: A | Discharge status: Home / Self Care | Attending: Surgery | Admitting: Surgery

## 2024-09-13 ENCOUNTER — Encounter
Admission: RE | Disposition: A | Discharge status: Home / Self Care | Payer: Self-pay | Source: Home / Self Care | Attending: Surgery

## 2024-09-13 DIAGNOSIS — R001 Bradycardia, unspecified: Secondary | ICD-10-CM

## 2024-09-13 DIAGNOSIS — Z01810 Encounter for preprocedural cardiovascular examination: Secondary | ICD-10-CM | POA: Diagnosis not present

## 2024-09-13 DIAGNOSIS — K644 Residual hemorrhoidal skin tags: Secondary | ICD-10-CM | POA: Insufficient documentation

## 2024-09-13 DIAGNOSIS — Z01812 Encounter for preprocedural laboratory examination: Secondary | ICD-10-CM

## 2024-09-13 DIAGNOSIS — Z79899 Other long term (current) drug therapy: Secondary | ICD-10-CM

## 2024-09-13 DIAGNOSIS — K642 Third degree hemorrhoids: Secondary | ICD-10-CM | POA: Insufficient documentation

## 2024-09-13 HISTORY — PX: HEMORRHOID SURGERY: SHX153

## 2024-09-13 LAB — BASIC METABOLIC PANEL WITH GFR
Anion gap: 7 (ref 5–15)
BUN: 15 mg/dL (ref 6–20)
CO2: 26 mmol/L (ref 22–32)
Calcium: 8.7 mg/dL — ABNORMAL LOW (ref 8.9–10.3)
Chloride: 108 mmol/L (ref 98–111)
Creatinine, Ser: 1.02 mg/dL — ABNORMAL HIGH (ref 0.44–1.00)
GFR, Estimated: >60 mL/min (ref >60)
Glucose, Bld: 87 mg/dL (ref 70–99)
Potassium: 3.8 mmol/L (ref 3.5–5.1)
Sodium: 141 mmol/L (ref 135–145)

## 2024-09-13 LAB — CBC
HCT: 41.9 % (ref 36.0–46.0)
Hemoglobin: 13.7 g/dL (ref 12.0–15.0)
MCH: 30.4 pg (ref 26.0–34.0)
MCHC: 32.7 g/dL (ref 30.0–36.0)
MCV: 93.1 fL (ref 80.0–100.0)
Platelets: 202 K/uL (ref 150–400)
RBC: 4.5 MIL/uL (ref 3.87–5.11)
RDW: 12.5 % (ref 11.5–15.5)
WBC: 5.4 K/uL (ref 4.0–10.5)
nRBC: 0 % (ref 0.0–0.2)

## 2024-09-13 LAB — POCT PREGNANCY, URINE: Preg Test, Ur: NEGATIVE

## 2024-09-13 SURGERY — HEMORRHOIDECTOMY
Anesthesia: General | Site: Rectum

## 2024-09-13 MED ORDER — GELATIN ABSORBABLE 12-7 MM EX MISC
CUTANEOUS | Status: DC | PRN
  Administered 2024-09-13: 1

## 2024-09-13 MED ORDER — 0.9 % SODIUM CHLORIDE (POUR BTL) OPTIME
TOPICAL | Status: DC | PRN
  Administered 2024-09-13: 500 mL

## 2024-09-13 MED ORDER — LIDOCAINE 5 % EX OINT
1.0000 | TOPICAL_OINTMENT | Freq: Three times a day (TID) | CUTANEOUS | 0 refills | Status: AC | PRN
  Filled 2024-09-13: qty 50, 30d supply, fill #0

## 2024-09-13 MED ORDER — BUPIVACAINE LIPOSOME 1.3 % IJ SUSP
INTRAMUSCULAR | Status: DC | PRN
  Administered 2024-09-13: 20 mL

## 2024-09-13 MED ORDER — ONDANSETRON HCL 4 MG/2ML IJ SOLN
INTRAMUSCULAR | Status: DC | PRN
  Administered 2024-09-13: 4 mg via INTRAVENOUS

## 2024-09-13 MED ORDER — OXYCODONE-ACETAMINOPHEN 5-325 MG PO TABS
1.0000 | ORAL_TABLET | Freq: Three times a day (TID) | ORAL | 0 refills | Status: AC | PRN
  Filled 2024-09-13: qty 15, 5d supply, fill #0

## 2024-09-13 MED ORDER — IBUPROFEN 800 MG PO TABS
800.0000 mg | ORAL_TABLET | Freq: Three times a day (TID) | ORAL | 0 refills | Status: AC | PRN
  Filled 2024-09-13: qty 30, 10d supply, fill #0

## 2024-09-13 MED ORDER — BUPIVACAINE LIPOSOME 1.3 % IJ SUSP
INTRAMUSCULAR | Status: AC
  Filled 2024-09-13: qty 20

## 2024-09-13 MED ORDER — OXYCODONE HCL 5 MG PO TABS
ORAL_TABLET | ORAL | Status: AC
  Filled 2024-09-13: qty 1

## 2024-09-13 MED ORDER — BUPIVACAINE-EPINEPHRINE (PF) 0.5% -1:200000 IJ SOLN
INTRAMUSCULAR | Status: DC | PRN
  Administered 2024-09-13: 15 mL via PERINEURAL

## 2024-09-13 MED ORDER — LIDOCAINE HCL (CARDIAC) PF 100 MG/5ML IV SOSY
PREFILLED_SYRINGE | INTRAVENOUS | Status: DC | PRN
  Administered 2024-09-13: 80 mg via INTRAVENOUS

## 2024-09-13 MED ORDER — PROPOFOL 10 MG/ML IV BOLUS
INTRAVENOUS | Status: DC | PRN
  Administered 2024-09-13: 200 mg via INTRAVENOUS

## 2024-09-13 MED ORDER — PHENYLEPHRINE 80 MCG/ML (10ML) SYRINGE FOR IV PUSH (FOR BLOOD PRESSURE SUPPORT)
PREFILLED_SYRINGE | INTRAVENOUS | Status: DC | PRN
  Administered 2024-09-13: 80 ug via INTRAVENOUS
  Administered 2024-09-13: 160 ug via INTRAVENOUS

## 2024-09-13 MED ORDER — MIDAZOLAM HCL 2 MG/2ML IJ SOLN
INTRAMUSCULAR | Status: AC
  Filled 2024-09-13: qty 2

## 2024-09-13 MED ORDER — PROPOFOL 10 MG/ML IV BOLUS
INTRAVENOUS | Status: AC
  Filled 2024-09-13: qty 20

## 2024-09-13 MED ORDER — CEFAZOLIN SODIUM-DEXTROSE 2-4 GM/100ML-% IV SOLN
INTRAVENOUS | Status: AC
  Filled 2024-09-13: qty 100

## 2024-09-13 MED ORDER — CELECOXIB 200 MG PO CAPS
ORAL_CAPSULE | ORAL | Status: AC
  Filled 2024-09-13: qty 1

## 2024-09-13 MED ORDER — FENTANYL CITRATE (PF) 100 MCG/2ML IJ SOLN: 25.0000 ug | INTRAMUSCULAR | Status: DC | PRN

## 2024-09-13 MED ORDER — MIDAZOLAM HCL (PF) 2 MG/2ML IJ SOLN
INTRAMUSCULAR | Status: DC | PRN
  Administered 2024-09-13: 2 mg via INTRAVENOUS

## 2024-09-13 MED ORDER — DOCUSATE SODIUM 100 MG PO CAPS
100.0000 mg | ORAL_CAPSULE | Freq: Two times a day (BID) | ORAL | 0 refills | Status: AC | PRN
  Filled 2024-09-13: qty 20, 10d supply, fill #0

## 2024-09-13 MED ORDER — LIDOCAINE HCL (PF) 2 % IJ SOLN
INTRAMUSCULAR | Status: AC
  Filled 2024-09-13: qty 5

## 2024-09-13 MED ORDER — OXYCODONE HCL 5 MG/5ML PO SOLN: 5.0000 mg | Freq: Once | ORAL | Status: AC | PRN

## 2024-09-13 MED ORDER — DEXAMETHASONE SOD PHOSPHATE PF 10 MG/ML IJ SOLN
INTRAMUSCULAR | Status: DC | PRN
  Administered 2024-09-13: 10 mg via INTRAVENOUS

## 2024-09-13 MED ORDER — FENTANYL CITRATE (PF) 100 MCG/2ML IJ SOLN
INTRAMUSCULAR | Status: AC
  Filled 2024-09-13: qty 2

## 2024-09-13 MED ORDER — ONDANSETRON HCL 4 MG/2ML IJ SOLN
INTRAMUSCULAR | Status: AC
  Filled 2024-09-13: qty 2

## 2024-09-13 MED ORDER — CHLORHEXIDINE GLUCONATE 0.12 % MT SOLN
OROMUCOSAL | Status: AC
  Filled 2024-09-13: qty 15

## 2024-09-13 MED ORDER — ACETAMINOPHEN 500 MG PO TABS
ORAL_TABLET | ORAL | Status: AC
  Filled 2024-09-13: qty 2

## 2024-09-13 MED ORDER — FENTANYL CITRATE (PF) 100 MCG/2ML IJ SOLN
INTRAMUSCULAR | Status: DC | PRN
  Administered 2024-09-13 (×2): 50 ug via INTRAVENOUS

## 2024-09-13 MED ORDER — GELATIN ABSORBABLE 12-7 MM EX MISC
CUTANEOUS | Status: AC
  Filled 2024-09-13: qty 1

## 2024-09-13 MED ORDER — SUCCINYLCHOLINE CHLORIDE 200 MG/10ML IV SOSY
PREFILLED_SYRINGE | INTRAVENOUS | Status: AC
  Filled 2024-09-13: qty 10

## 2024-09-13 MED ORDER — ACETAMINOPHEN 325 MG PO TABS
650.0000 mg | ORAL_TABLET | Freq: Three times a day (TID) | ORAL | 0 refills | Status: AC | PRN
  Filled 2024-09-13: qty 40, 7d supply, fill #0

## 2024-09-13 MED ORDER — GABAPENTIN 300 MG PO CAPS
ORAL_CAPSULE | ORAL | Status: AC
  Filled 2024-09-13: qty 1

## 2024-09-13 MED ORDER — OXYCODONE HCL 5 MG PO TABS
5.0000 mg | ORAL_TABLET | Freq: Once | ORAL | Status: AC | PRN
  Administered 2024-09-13: 5 mg via ORAL

## 2024-09-13 MED ORDER — ROCURONIUM BROMIDE 10 MG/ML (PF) SYRINGE
PREFILLED_SYRINGE | INTRAVENOUS | Status: AC
  Filled 2024-09-13: qty 10

## 2024-09-13 MED ORDER — BUPIVACAINE-EPINEPHRINE (PF) 0.5% -1:200000 IJ SOLN
INTRAMUSCULAR | Status: AC
  Filled 2024-09-13: qty 30

## 2024-09-13 SURGICAL SUPPLY — 26 items
BLADE SURG 15 STRL LF DISP TIS (BLADE) ×1 IMPLANT
BRIEF MESH DISP 2XL (UNDERPADS AND DIAPERS) ×1 IMPLANT
DRAPE PERI LITHO V/GYN (MISCELLANEOUS) ×1 IMPLANT
DRAPE UNDER BUTTOCK W/FLU (DRAPES) ×1 IMPLANT
DRSG GAUZE FLUFF 36X18 (GAUZE/BANDAGES/DRESSINGS) ×1 IMPLANT
ELECTRODE REM PT RTRN 9FT ADLT (ELECTROSURGICAL) ×1 IMPLANT
GAUZE 4X4 16PLY ~~LOC~~+RFID DBL (SPONGE) IMPLANT
GLOVE BIOGEL PI IND STRL 7.0 (GLOVE) ×1 IMPLANT
GLOVE SURG SYN 6.5 PF PI (GLOVE) ×3 IMPLANT
GOWN STRL REUS W/ TWL LRG LVL3 (GOWN DISPOSABLE) ×3 IMPLANT
KIT TURNOVER CYSTO (KITS) ×1 IMPLANT
LABEL OR SOLS (LABEL) ×1 IMPLANT
MANIFOLD NEPTUNE II (INSTRUMENTS) ×1 IMPLANT
NDL HYPO 22X1.5 SAFETY MO (MISCELLANEOUS) ×1 IMPLANT
NEEDLE HYPO 22X1.5 SAFETY MO (MISCELLANEOUS) ×1 IMPLANT
NS IRRIG 500ML POUR BTL (IV SOLUTION) ×1 IMPLANT
PACK BASIN MINOR ARMC (MISCELLANEOUS) ×1 IMPLANT
PAD PREP OB/GYN DISP 24X41 (PERSONAL CARE ITEMS) ×1 IMPLANT
SHEARS HARMONIC 9CM CVD (BLADE) IMPLANT
SOLUTION PREP PVP 2OZ (MISCELLANEOUS) ×1 IMPLANT
SURGILUBE 2OZ TUBE FLIPTOP (MISCELLANEOUS) ×1 IMPLANT
SUT VIC AB 3-0 SH 27X BRD (SUTURE) ×1 IMPLANT
SWAB CULTURE AMIES ANAERIB BLU (MISCELLANEOUS) IMPLANT
SYR 10ML LL (SYRINGE) ×1 IMPLANT
SYR 20ML LL LF (SYRINGE) ×1 IMPLANT
TRAP FLUID SMOKE EVACUATOR (MISCELLANEOUS) ×1 IMPLANT

## 2024-09-13 NOTE — Discharge Instructions (Signed)
 Hemorrhoids, Care After This sheet gives you information about how to care for yourself after your procedure. Your health care provider may also give you more specific instructions. If you have problems or questions, contact your health care provider. What can I expect after the procedure? After the procedure, it is common to have: Soreness. Bruising. Itching.  Follow these instructions at home: site care Follow instructions from your health care provider about how to take care of your site. Make sure you: LEAVE packing in place until it falls out on its own.  No need to replace afterwards Leave stitches (sutures), skin glue, or adhesive strips in place.  If the area bleeds or bruises, apply gentle pressure for 10 minutes. OK TO SHOWER IN 24HRS  General instructions Rest and then return to your normal activities as told by your health care provider. tylenol  and advil  as needed for discomfort.  Please alternate between the two every four hours as needed for pain.    Use narcotics, if prescribed, only when tylenol  and motrin  is not enough to control pain.  325-650mg  every 8hrs to max of 3000mg /24hrs (including the 325mg  in every norco dose) for the tylenol .    Advil  up to 800mg  per dose every 8hrs as needed for pain.   Keep all follow-up visits as told by your health care provider. This is important. Contact a health care provider if you have excessive: redness, swelling, or pain around your site. blood coming from your site. pus or a bad smell coming from your site. You have a fever.  Get help right away if: You have bleeding that does not stop with pressure or a dressing. Summary After the procedure, it is common to have some soreness, bruising, and itching at the site. Follow instructions from your health care provider about how to take care of your site. Keep all follow-up visits as told by your health care provider. This is important. This information is not intended to replace  advice given to you by your health care provider. Make sure you discuss any questions you have with your health care provider. Document Released: 11/20/2015 Document Revised: 04/23/2018 Document Reviewed: 04/23/2018 Elsevier Interactive Patient Education  [employer]. I was just about 8 I was it I was here anyway so I want to check to see to anxious she is kind in her abdomen was like her had an open abdomen yeah and then the pneumoperitoneum 27 she has been kind doing anything she would like she had a abscesslike

## 2024-09-13 NOTE — Interval H&P Note (Signed)
 History and Physical Interval Note:  09/13/2024 6:50 AM  Heather [name]  has presented today for surgery, with the diagnosis of K64.1 Hemorrhoidectomy.  The various methods of treatment have been discussed with the patient and family. After consideration of risks, benefits and other options for treatment, the patient has consented to  Procedure(s): HEMORRHOIDECTOMY (N/A) as a surgical intervention.  The patient's history has been reviewed, patient examined, no change in status, stable for surgery.  I have reviewed the patient's chart and labs.  Questions were answered to the patient's satisfaction.     Savalas Monje Tye

## 2024-09-13 NOTE — Anesthesia Procedure Notes (Signed)
 Procedure Name: LMA Insertion Date/Time: 09/13/2024 7:36 AM  Performed by: Trudy Rankin LABOR, CRNAPre-anesthesia Checklist: Patient identified, Emergency Drugs available, Suction available, Patient being monitored and Timeout performed Patient Re-evaluated:Patient Re-evaluated prior to induction Oxygen Delivery Method: Circle system utilized Preoxygenation: Pre-oxygenation with 100% oxygen Induction Type: IV induction LMA: LMA inserted and LMA with gastric port inserted LMA Size: 3.0 Number of attempts: 1 Placement Confirmation: positive ETCO2 and breath sounds checked- equal and bilateral Tube secured with: Tape Dental Injury: Teeth and Oropharynx as per pre-operative assessment

## 2024-09-13 NOTE — Anesthesia Postprocedure Evaluation (Signed)
 Anesthesia Post Note  Patient: Heather [name]  Procedure(s) Performed: HEMORRHOIDECTOMY (Rectum)  Patient location during evaluation: PACU Anesthesia Type: General Level of consciousness: awake and alert Pain management: pain level controlled Vital Signs Assessment: post-procedure vital signs reviewed and stable Respiratory status: spontaneous breathing, nonlabored ventilation, respiratory function stable and patient connected to nasal cannula oxygen Cardiovascular status: blood pressure returned to baseline and stable Postop Assessment: no apparent nausea or vomiting Anesthetic complications: no   No notable events documented.   Last Vitals:  Vitals:   09/13/24 0900 09/13/24 0917  BP: (!) 131/103 126/77  Pulse: 73 82  Resp: 17 16  Temp:  36.4 C  SpO2: 100% 100%    Last Pain:  Vitals:   09/13/24 0917  TempSrc: Temporal  PainSc: 0-No pain                 Debby Mines

## 2024-09-13 NOTE — Transfer of Care (Signed)
 Immediate Anesthesia Transfer of Care Note  Patient: Heather [name]  Procedure(s) Performed: HEMORRHOIDECTOMY (Rectum)  Patient Location: PACU  Anesthesia Type:General  Level of Consciousness: awake, alert , and confused  Airway & Oxygen Therapy: Patient Spontanous Breathing and Patient connected to nasal cannula oxygen  Post-op Assessment: Report given to RN and Post -op Vital signs reviewed and stable  Post vital signs: Reviewed and stable  Last Vitals:  Vitals Value Taken Time  BP 126/77 09/13/24 09:17  Temp 36.4 C 09/13/24 09:17  Pulse 82 09/13/24 09:17  Resp 16 09/13/24 09:17  SpO2 100 % 09/13/24 09:17    Last Pain:  Vitals:   09/13/24 0917  TempSrc: Temporal  PainSc: 0-No pain         Complications: No notable events documented.

## 2024-09-13 NOTE — H&P (Signed)
 Subjective:  CC: Grade II hemorrhoids [K64.1]   HPI: Returns for above.   History of Present Illness Heather [name] is a 28 year old female with a history of hemorrhoids who presents for a surgical consult regarding hemorrhoids.  She has experienced hemorrhoids for five years, with a recent exacerbation on Thursday, identifying three external hemorrhoids. These cause significant bleeding and pain during defecation. The bleeding is bright red, and the pain is sharp and constant, worsening with sitting, walking, and using the bathroom.  She has attempted treatment with witch hazel and age cream without improvement. Her diet includes adequate water and fiber intake, and she typically has three bowel movements per day. A colonoscopy was performed in June or July which only showed hemorrhoids  No nausea, vomiting, diarrhea, abdominal pain, or constipation.    Past Medical History:  has a past medical history of Seizures (CMS/HHS-HCC).  Past Surgical History:  has a past surgical history that includes Colonoscopy (N/A, 04/03/2024) and Hemorroidectomy (N/A, 04/03/2024).  Family History: family history is not on file.  Social History:  reports that she has never smoked. She has never used smokeless tobacco. She reports that she does not currently use alcohol. She reports that she does not currently use drugs.  Current Medications: has a current medication list which includes the following prescription(s): acetaminophen , cyanocobalamin, diazepam , levetiracetam , and ergocalciferol (vitamin d2).  Allergies:  Allergies as of 08/05/2024   (No Known Allergies)    ROS:  A 15 point review of systems was performed and pertinent positives and negatives noted in HPI  Objective:     BP 107/70   Pulse 67   Ht 167.6 cm (5' 6)   Wt 71.7 kg (158 lb)   LMP 08/02/2024   BMI 25.50 kg/m   Constitutional :  No distress, cooperative, alert  Lymphatics/Throat:  Supple with no lymphadenopathy   Respiratory:  Clear to auscultation bilaterally  Cardiovascular:  Regular rate and rhythm  Gastrointestinal: Soft, non-tender, non-distended, no organomegaly.  Musculoskeletal: Steady gait and movement  Skin: Cool and moist  Psychiatric: Normal affect, non-agitated, not confused  Rectal: deferred      LABS:  N/a   RADS: N/a  Assessment:     Grade II hemorrhoids [K64.1]  Plan:    1. Grade II hemorrhoids [K64.1] Discussed risks/benefits/alternatives to surgery.  Alternatives include the options of observation, medical management.  Benefits include symptomatic relief.  I discussed  in detail and the complications related to the operation and the anesthesia, including bleeding, infection, recurrence, remote possibility of temporary or permanent fecal incontinence, poor/delayed wound healing, chronic pain, and additional procedures to address said risks. The risks of general anesthetic, if used, includes MI, CVA, sudden death or even reaction to anesthetic medications also discussed.   We also discussed typical post operative recovery which includes weeks to potentially months of anal pain, drainage, occasional bleeding, and sense of fecal urgency.    ED return precautions given for sudden increase in pain, bleeding, with possible accompanying fever, nausea, and/or vomiting.  The patient understands the risks, any and all questions were answered to the patient's satisfaction.  2. Patient has elected to proceed with surgical treatment. Procedure will be scheduled.  labs/images/medications/previous chart entries reviewed personally and relevant changes/updates noted above.

## 2024-09-13 NOTE — Op Note (Signed)
 Preoperative diagnosis: Third degree hemorrhoids and skin tags x 3  Postoperative diagnosis: Same  Procedure: exam under anesthesia, 3 column hemorrhoidectomy.  Surgeon: Tye  Anesthesia: general  Specimen: hemorrhoids x2  Complications: none  EBL: 15mL  Wound classification: Clean Contaminated  Indications: Patient is a 28 y.o. female was found to have symptomatic hemorrhoids refractory to medical management.   Findings: 1. third  degree hemorrhoids and associated skin tags 2. Internal and external anal sphincter palpated and preserved 3. Adequate hemostasis  Description of procedure: The patient was brought to the operating room and general anesthesia was induced. Patient was placed high lithotomy position. A time-out was completed verifying correct patient, procedure, site, positioning, and implant(s) and/or special equipment prior to beginning this procedure. The perineum was prepped and draped in standard sterile fashion. Local anesthetic was injected as a perianal block. An anoscope was introduced and hemorrhoidal pedicles identified.  A harmonic was placed across the base of the left pedicle and excess hemorrhoidal tissue and associated skin tag  removed.  Specimen was passed off operative field pending pathology.  3-0 Vicryl then used to close the open wound.  A harmonic was placed across the base of the right anteriorpedicle and excess hemorrhoidal tissue and associated skin tag removed.  Specimen was passed off operative field pending pathology.  3-0 Vicryl then used to close the open wound.  A harmonic was placed across the base of the right posterior pedicle and excess hemorrhoidal tissue and associated skin tag removed.  Specimen was passed off operative field pending pathology.  3-0 Vicryl then used to close the open wound.  Hemostasis achieved with additional 3-0 vicryl suture in areas of bleeding until all active bleeding controlled.  Last inspection of the anal  canal did not note any additional hemorrhoidal tissue and no other pathology. Exparel  injected as a perianal block.  Surgifoam placed within anal canal.  A gauze pad was tucked between the gluteal folds, and secured in place with mesh underwear.  The patient tolerated the procedure well and was taken to the postanesthesia care unit in stable condition.  Sponge and instrument count correct at end of procedure.

## 2024-09-13 NOTE — Anesthesia Preprocedure Evaluation (Signed)
 Anesthesia Evaluation  Patient identified by MRN, date of birth, ID band Patient awake    Reviewed: Allergy & Precautions, NPO status , Patient's Chart, lab work & pertinent test results  Airway Mallampati: III  TM Distance: >3 FB Neck ROM: full    Dental  (+) Chipped   Pulmonary neg shortness of breath, Current Smoker and Patient abstained from smoking.   Pulmonary exam normal        Cardiovascular hypertension, Normal cardiovascular exam     Neuro/Psych  Headaches, Seizures -, Well Controlled,   negative psych ROS   GI/Hepatic negative GI ROS, Neg liver ROS,neg GERD  ,,  Endo/Other  negative endocrine ROS    Renal/GU      Musculoskeletal   Abdominal   Peds  Hematology negative hematology ROS (+)   Anesthesia Other Findings Past Medical History: No date: Seizures (HCC)     Comment:  last seizure November 2019  Past Surgical History: 07/11/2021: APPENDECTOMY  BMI    Body Mass Index: 21.76 kg/m      Reproductive/Obstetrics negative OB ROS                              Anesthesia Physical Anesthesia Plan  ASA: 2  Anesthesia Plan: General   Post-op Pain Management:    Induction: Intravenous  PONV Risk Score and Plan: 3 and Ondansetron , Dexamethasone  and Midazolam   Airway Management Planned: LMA  Additional Equipment:   Intra-op Plan:   Post-operative Plan: Extubation in OR  Informed Consent: I have reviewed the patients History and Physical, chart, labs and discussed the procedure including the risks, benefits and alternatives for the proposed anesthesia with the patient or authorized representative who has indicated his/her understanding and acceptance.     Dental Advisory Given  Plan Discussed with: Anesthesiologist, CRNA and Surgeon  Anesthesia Plan Comments: (Patient consented for risks of anesthesia including but not limited to:  - adverse reactions to  medications - damage to eyes, teeth, lips or other oral mucosa - nerve damage due to positioning  - sore throat or hoarseness - Damage to heart, brain, nerves, lungs, other parts of body or loss of life  Patient voiced understanding and assent.)        Anesthesia Quick Evaluation

## 2024-09-14 ENCOUNTER — Encounter: Payer: Self-pay | Admitting: Surgery

## 2024-09-16 LAB — SURGICAL PATHOLOGY

## 2024-11-12 ENCOUNTER — Other Ambulatory Visit (HOSPITAL_COMMUNITY): Payer: Self-pay

## 2024-12-16 ENCOUNTER — Ambulatory Visit
